# Patient Record
Sex: Female | Born: 1990 | Race: White | Hispanic: No | Marital: Single | Smoking: Former smoker
Health system: Southern US, Community
[De-identification: ages and names within clinical notes are randomized; demographics above are authoritative.]

## PROBLEM LIST (undated history)

## (undated) DIAGNOSIS — J45909 Unspecified asthma, uncomplicated: Secondary | ICD-10-CM

## (undated) DIAGNOSIS — G43909 Migraine, unspecified, not intractable, without status migrainosus: Secondary | ICD-10-CM

## (undated) DIAGNOSIS — K589 Irritable bowel syndrome without diarrhea: Secondary | ICD-10-CM

## (undated) DIAGNOSIS — F419 Anxiety disorder, unspecified: Secondary | ICD-10-CM

## (undated) DIAGNOSIS — Z8619 Personal history of other infectious and parasitic diseases: Secondary | ICD-10-CM

## (undated) DIAGNOSIS — Z87440 Personal history of urinary (tract) infections: Secondary | ICD-10-CM

## (undated) DIAGNOSIS — D649 Anemia, unspecified: Secondary | ICD-10-CM

## (undated) DIAGNOSIS — Z8041 Family history of malignant neoplasm of ovary: Secondary | ICD-10-CM

## (undated) HISTORY — DX: Personal history of other infectious and parasitic diseases: Z86.19

## (undated) HISTORY — DX: Migraine, unspecified, not intractable, without status migrainosus: G43.909

## (undated) HISTORY — DX: Anemia, unspecified: D64.9

## (undated) HISTORY — PX: NO PAST SURGERIES: SHX2092

## (undated) HISTORY — DX: Family history of malignant neoplasm of ovary: Z80.41

## (undated) HISTORY — PX: COLONOSCOPY: SHX174

## (undated) HISTORY — DX: Personal history of urinary (tract) infections: Z87.440

## (undated) HISTORY — PX: OTHER SURGICAL HISTORY: SHX169

## (undated) SURGICAL SUPPLY — 3 items
NDL HYPO 21X1.5 SAFETY (NEEDLE) IMPLANT
SOLN 0.9% NACL 1000 ML (IV SOLUTION) ×1 IMPLANT
SOLN STERILE WATER 1000 ML (IV SOLUTION) ×1 IMPLANT

## (undated) SURGICAL SUPPLY — 2 items
SOLN 0.9% NACL 1000 ML (IV SOLUTION) ×3 IMPLANT
SOLN STERILE WATER 1000 ML (IV SOLUTION) ×3 IMPLANT

---
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
## 2012-02-09 ENCOUNTER — Emergency Department: Payer: Self-pay | Admitting: Emergency Medicine

## 2012-05-05 ENCOUNTER — Observation Stay: Payer: Self-pay | Admitting: Internal Medicine

## 2012-05-05 LAB — COMPREHENSIVE METABOLIC PANEL
Alkaline Phosphatase: 114 U/L (ref 82–169)
Bilirubin,Total: 0.6 mg/dL (ref 0.2–1.0)
Calcium, Total: 9.1 mg/dL (ref 9.0–10.7)
Creatinine: 0.75 mg/dL (ref 0.60–1.30)
EGFR (African American): >60
Osmolality: 280 (ref 275–301)
Potassium: 3.3 mmol/L — ABNORMAL LOW (ref 3.5–5.1)
SGOT(AST): 18 U/L (ref 0–26)
SGPT (ALT): 15 U/L (ref 12–78)
Sodium: 139 mmol/L (ref 136–145)

## 2012-05-05 LAB — CBC
MCHC: 32.3 g/dL (ref 32.0–36.0)
MCV: 75 fL — ABNORMAL LOW (ref 80–100)
Platelet: 234 10*3/uL (ref 150–440)
WBC: 9.6 10*3/uL (ref 3.6–11.0)

## 2012-05-05 LAB — URINALYSIS, COMPLETE
Bilirubin,UR: NEGATIVE
Glucose,UR: NEGATIVE mg/dL (ref 0–75)
Nitrite: NEGATIVE
Protein: 30
RBC,UR: 1 /HPF (ref 0–5)
Specific Gravity: 1.023 (ref 1.003–1.030)
Squamous Epithelial: 3

## 2012-05-05 LAB — MAGNESIUM: Magnesium: 1.2 mg/dL — ABNORMAL LOW

## 2012-05-06 LAB — BASIC METABOLIC PANEL
BUN: 5 mg/dL — ABNORMAL LOW (ref 7–18)
Calcium, Total: 8 mg/dL — ABNORMAL LOW (ref 9.0–10.7)
Chloride: 110 mmol/L — ABNORMAL HIGH (ref 98–107)
Co2: 22 mmol/L (ref 21–32)
EGFR (African American): >60
EGFR (Non-African Amer.): >60
Glucose: 87 mg/dL (ref 65–99)
Osmolality: 274 (ref 275–301)
Potassium: 3.7 mmol/L (ref 3.5–5.1)
Sodium: 139 mmol/L (ref 136–145)

## 2012-05-06 LAB — MAGNESIUM: Magnesium: 2 mg/dL

## 2012-05-07 ENCOUNTER — Emergency Department: Payer: Self-pay

## 2012-05-07 LAB — BASIC METABOLIC PANEL WITH GFR
Anion Gap: 10 (ref 7–16)
BUN: 8 mg/dL (ref 7–18)
Calcium, Total: 8.7 mg/dL — ABNORMAL LOW (ref 9.0–10.7)
Chloride: 109 mmol/L — ABNORMAL HIGH (ref 98–107)
Co2: 19 mmol/L — ABNORMAL LOW (ref 21–32)
Creatinine: 0.71 mg/dL (ref 0.60–1.30)
EGFR (African American): >60
EGFR (Non-African Amer.): >60
Glucose: 101 mg/dL — ABNORMAL HIGH (ref 65–99)
Osmolality: 274 (ref 275–301)
Potassium: 3.5 mmol/L (ref 3.5–5.1)
Sodium: 138 mmol/L (ref 136–145)

## 2012-05-07 LAB — URINALYSIS, COMPLETE
Bilirubin,UR: NEGATIVE
Blood: NEGATIVE
Glucose,UR: NEGATIVE mg/dL (ref 0–75)
Nitrite: NEGATIVE
Ph: 7 (ref 4.5–8.0)
Protein: NEGATIVE
RBC,UR: 1 /HPF (ref 0–5)
Specific Gravity: 1.015 (ref 1.003–1.030)
Squamous Epithelial: 3
WBC UR: 2 /HPF (ref 0–5)

## 2012-05-07 LAB — CBC
HCT: 33.1 % — ABNORMAL LOW (ref 35.0–47.0)
HGB: 10.5 g/dL — ABNORMAL LOW (ref 12.0–16.0)
MCHC: 31.7 g/dL — ABNORMAL LOW (ref 32.0–36.0)
MCV: 75 fL — ABNORMAL LOW (ref 80–100)
RBC: 4.41 10*6/uL (ref 3.80–5.20)
WBC: 6.9 10*3/uL (ref 3.6–11.0)

## 2012-05-07 LAB — DRUG SCREEN, URINE
Amphetamines, Ur Screen: NEGATIVE (ref ?–1000)
Barbiturates, Ur Screen: NEGATIVE (ref ?–200)
Benzodiazepine, Ur Scrn: NEGATIVE (ref ?–200)
Cannabinoid 50 Ng, Ur ~~LOC~~: POSITIVE (ref ?–50)
Cocaine Metabolite,Ur ~~LOC~~: NEGATIVE (ref ?–300)
MDMA (Ecstasy)Ur Screen: NEGATIVE (ref ?–500)
Methadone, Ur Screen: NEGATIVE (ref ?–300)
Opiate, Ur Screen: NEGATIVE (ref ?–300)
Phencyclidine (PCP) Ur S: NEGATIVE (ref ?–25)
Tricyclic, Ur Screen: NEGATIVE (ref ?–1000)

## 2012-05-07 LAB — ETHANOL
Ethanol %: <0.003 % (ref 0.000–0.080)
Ethanol: <3 mg/dL

## 2012-05-07 LAB — MAGNESIUM: Magnesium: 1.2 mg/dL — ABNORMAL LOW

## 2012-05-08 LAB — CBC WITH DIFFERENTIAL/PLATELET
Basophil %: 0.1 %
Eosinophil #: 0 10*3/uL (ref 0.0–0.7)
HCT: 37.5 % (ref 35.0–47.0)
HGB: 12.3 g/dL (ref 12.0–16.0)
Lymphocyte #: 1 10*3/uL (ref 1.0–3.6)
Lymphocyte %: 11.4 %
MCH: 24.3 pg — ABNORMAL LOW (ref 26.0–34.0)
MCHC: 32.8 g/dL (ref 32.0–36.0)
Monocyte #: 0.7 x10 3/mm (ref 0.2–0.9)
Monocyte %: 8.1 %
Neutrophil #: 7.2 10*3/uL — ABNORMAL HIGH (ref 1.4–6.5)
Neutrophil %: 80.4 %
Platelet: 250 10*3/uL (ref 150–440)
WBC: 8.9 10*3/uL (ref 3.6–11.0)

## 2012-05-08 LAB — URINALYSIS, COMPLETE
Blood: NEGATIVE
Granular Cast: 2
Nitrite: NEGATIVE
Ph: 6 (ref 4.5–8.0)
RBC,UR: 8 /HPF (ref 0–5)
Specific Gravity: 1.025 (ref 1.003–1.030)
Squamous Epithelial: 4
WBC UR: 17 /HPF (ref 0–5)

## 2012-05-08 LAB — BASIC METABOLIC PANEL
BUN: 10 mg/dL (ref 7–18)
Creatinine: 0.63 mg/dL (ref 0.60–1.30)
EGFR (African American): >60
EGFR (Non-African Amer.): >60
Osmolality: 274 (ref 275–301)

## 2012-05-08 LAB — HEPATIC FUNCTION PANEL A (ARMC)
Albumin: 4.6 g/dL (ref 3.8–5.6)
Bilirubin, Direct: 0.1 mg/dL (ref 0.00–0.20)
Bilirubin,Total: 0.5 mg/dL (ref 0.2–1.0)
SGOT(AST): 19 U/L (ref 0–26)

## 2012-05-08 LAB — LIPASE, BLOOD: Lipase: 123 U/L (ref 73–393)

## 2012-05-08 LAB — MAGNESIUM: Magnesium: 1.5 mg/dL — ABNORMAL LOW

## 2012-05-09 ENCOUNTER — Observation Stay: Payer: Self-pay | Admitting: Internal Medicine

## 2012-05-09 LAB — BASIC METABOLIC PANEL
BUN: 11 mg/dL (ref 7–18)
Calcium, Total: 8.5 mg/dL — ABNORMAL LOW (ref 9.0–10.7)
Creatinine: 0.57 mg/dL — ABNORMAL LOW (ref 0.60–1.30)
EGFR (African American): >60
EGFR (Non-African Amer.): >60
Potassium: 3.4 mmol/L — ABNORMAL LOW (ref 3.5–5.1)

## 2012-05-09 LAB — MAGNESIUM: Magnesium: 2.2 mg/dL

## 2012-05-09 LAB — CBC WITH DIFFERENTIAL/PLATELET
Basophil #: 0 10*3/uL (ref 0.0–0.1)
Basophil %: 0.2 %
Eosinophil #: 0 10*3/uL (ref 0.0–0.7)
Eosinophil %: 0.2 %
Lymphocyte #: 1.3 10*3/uL (ref 1.0–3.6)
Lymphocyte %: 13.7 %
MCH: 24.2 pg — ABNORMAL LOW (ref 26.0–34.0)
MCHC: 32.8 g/dL (ref 32.0–36.0)
MCV: 74 fL — ABNORMAL LOW (ref 80–100)
Monocyte #: 1.2 x10 3/mm — ABNORMAL HIGH (ref 0.2–0.9)
Neutrophil %: 73.6 %
RBC: 4.32 10*6/uL (ref 3.80–5.20)
RDW: 18.8 % — ABNORMAL HIGH (ref 11.5–14.5)
WBC: 9.6 10*3/uL (ref 3.6–11.0)

## 2012-05-10 LAB — URINALYSIS, COMPLETE
Bilirubin,UR: NEGATIVE
Glucose,UR: NEGATIVE mg/dL (ref 0–75)
Nitrite: NEGATIVE
Ph: 8 (ref 4.5–8.0)
RBC,UR: 6 /HPF (ref 0–5)
Squamous Epithelial: 9
WBC UR: 20 /HPF (ref 0–5)

## 2012-05-10 LAB — BASIC METABOLIC PANEL
Anion Gap: 6 — ABNORMAL LOW (ref 7–16)
BUN: 4 mg/dL — ABNORMAL LOW (ref 7–18)
Calcium, Total: 9.1 mg/dL (ref 9.0–10.7)
Chloride: 106 mmol/L (ref 98–107)
Co2: 25 mmol/L (ref 21–32)
EGFR (African American): >60
Glucose: 103 mg/dL — ABNORMAL HIGH (ref 65–99)
Potassium: 3.5 mmol/L (ref 3.5–5.1)

## 2012-05-10 LAB — URINE CULTURE

## 2012-05-11 LAB — BASIC METABOLIC PANEL
Anion Gap: 8 (ref 7–16)
BUN: 3 mg/dL — ABNORMAL LOW (ref 7–18)
Chloride: 107 mmol/L (ref 98–107)
EGFR (African American): >60

## 2012-05-12 LAB — BASIC METABOLIC PANEL
BUN: 4 mg/dL — ABNORMAL LOW (ref 7–18)
Calcium, Total: 9 mg/dL (ref 9.0–10.7)
Chloride: 106 mmol/L (ref 98–107)
Creatinine: 0.58 mg/dL — ABNORMAL LOW (ref 0.60–1.30)
EGFR (Non-African Amer.): >60
Glucose: 97 mg/dL (ref 65–99)
Osmolality: 274 (ref 275–301)
Potassium: 3.6 mmol/L (ref 3.5–5.1)
Sodium: 139 mmol/L (ref 136–145)

## 2012-05-17 ENCOUNTER — Emergency Department: Payer: Self-pay | Admitting: Internal Medicine

## 2012-05-17 LAB — PREGNANCY, URINE: Pregnancy Test, Urine: NEGATIVE m[IU]/mL

## 2012-05-17 LAB — URINALYSIS, COMPLETE
Bilirubin,UR: NEGATIVE
Blood: NEGATIVE
Glucose,UR: NEGATIVE mg/dL (ref 0–75)
Nitrite: NEGATIVE
RBC,UR: 2 /HPF (ref 0–5)
Specific Gravity: 1.019 (ref 1.003–1.030)
Squamous Epithelial: 1

## 2012-05-17 LAB — CBC
HCT: 37.1 % (ref 35.0–47.0)
MCH: 23.7 pg — ABNORMAL LOW (ref 26.0–34.0)
MCV: 75 fL — ABNORMAL LOW (ref 80–100)
Platelet: 303 10*3/uL (ref 150–440)
RBC: 4.96 10*6/uL (ref 3.80–5.20)

## 2012-05-17 LAB — COMPREHENSIVE METABOLIC PANEL
Chloride: 102 mmol/L (ref 98–107)
Co2: 27 mmol/L (ref 21–32)
Creatinine: 0.68 mg/dL (ref 0.60–1.30)
EGFR (African American): >60
Osmolality: 272 (ref 275–301)
Potassium: 3.5 mmol/L (ref 3.5–5.1)
SGOT(AST): 18 U/L (ref 0–26)
SGPT (ALT): 45 U/L (ref 12–78)
Total Protein: 8.9 g/dL — ABNORMAL HIGH (ref 6.4–8.6)

## 2012-05-20 ENCOUNTER — Emergency Department: Payer: Self-pay | Admitting: Emergency Medicine

## 2012-05-20 LAB — CBC
HGB: 11.2 g/dL — ABNORMAL LOW (ref 12.0–16.0)
MCH: 23 pg — ABNORMAL LOW (ref 26.0–34.0)
MCHC: 31.2 g/dL — ABNORMAL LOW (ref 32.0–36.0)
MCV: 74 fL — ABNORMAL LOW (ref 80–100)
Platelet: 255 10*3/uL (ref 150–440)
RBC: 4.86 10*6/uL (ref 3.80–5.20)
RDW: 18.2 % — ABNORMAL HIGH (ref 11.5–14.5)
WBC: 12 10*3/uL — ABNORMAL HIGH (ref 3.6–11.0)

## 2012-05-20 LAB — DRUG SCREEN, URINE
Amphetamines, Ur Screen: NEGATIVE (ref ?–1000)
Benzodiazepine, Ur Scrn: NEGATIVE (ref ?–200)
Cannabinoid 50 Ng, Ur ~~LOC~~: POSITIVE (ref ?–50)
Cocaine Metabolite,Ur ~~LOC~~: NEGATIVE (ref ?–300)

## 2012-05-20 LAB — COMPREHENSIVE METABOLIC PANEL
Albumin: 4.2 g/dL (ref 3.8–5.6)
BUN: 11 mg/dL (ref 7–18)
Bilirubin,Total: 0.3 mg/dL (ref 0.2–1.0)
Calcium, Total: 9.4 mg/dL (ref 9.0–10.7)
Co2: 22 mmol/L (ref 21–32)
Creatinine: 0.83 mg/dL (ref 0.60–1.30)
EGFR (African American): >60
EGFR (Non-African Amer.): >60
Glucose: 110 mg/dL — ABNORMAL HIGH (ref 65–99)
Sodium: 136 mmol/L (ref 136–145)

## 2012-05-20 LAB — URINALYSIS, COMPLETE
Bacteria: NONE SEEN
Bilirubin,UR: NEGATIVE
Bilirubin,UR: NEGATIVE
Blood: NEGATIVE
Glucose,UR: NEGATIVE mg/dL (ref 0–75)
Glucose,UR: NEGATIVE mg/dL (ref 0–75)
Nitrite: NEGATIVE
Nitrite: NEGATIVE
Ph: 7 (ref 4.5–8.0)
RBC,UR: 10 /HPF (ref 0–5)
Specific Gravity: 1.019 (ref 1.003–1.030)
Squamous Epithelial: 3

## 2012-05-20 LAB — LIPASE, BLOOD: Lipase: 119 U/L (ref 73–393)

## 2012-08-01 ENCOUNTER — Emergency Department: Payer: Self-pay | Admitting: Emergency Medicine

## 2012-08-01 LAB — COMPREHENSIVE METABOLIC PANEL
Alkaline Phosphatase: 111 U/L (ref 82–169)
BUN: 15 mg/dL (ref 7–18)
Bilirubin,Total: 0.3 mg/dL (ref 0.2–1.0)
Co2: 27 mmol/L (ref 21–32)
Creatinine: 0.63 mg/dL (ref 0.60–1.30)
EGFR (African American): >60
EGFR (Non-African Amer.): >60
Potassium: 3.3 mmol/L — ABNORMAL LOW (ref 3.5–5.1)
SGOT(AST): 19 U/L (ref 0–26)
Sodium: 136 mmol/L (ref 136–145)
Total Protein: 8.2 g/dL (ref 6.4–8.6)

## 2012-08-01 LAB — CBC
HCT: 30.6 % — ABNORMAL LOW (ref 35.0–47.0)
MCH: 23.4 pg — ABNORMAL LOW (ref 26.0–34.0)
MCHC: 32.1 g/dL (ref 32.0–36.0)

## 2012-08-02 LAB — URINALYSIS, COMPLETE
Bilirubin,UR: NEGATIVE
Leukocyte Esterase: NEGATIVE
Ph: 5 (ref 4.5–8.0)
Specific Gravity: 1.027 (ref 1.003–1.030)
Squamous Epithelial: 1
WBC UR: 2 /HPF (ref 0–5)

## 2012-08-02 LAB — PREGNANCY, URINE: Pregnancy Test, Urine: NEGATIVE m[IU]/mL

## 2012-12-06 ENCOUNTER — Emergency Department: Payer: Self-pay | Admitting: Emergency Medicine

## 2012-12-25 ENCOUNTER — Emergency Department: Payer: Self-pay | Admitting: Emergency Medicine

## 2012-12-25 LAB — COMPREHENSIVE METABOLIC PANEL
Albumin: 3.8 g/dL (ref 3.4–5.0)
BUN: 11 mg/dL (ref 7–18)
Calcium, Total: 8.7 mg/dL (ref 8.5–10.1)
Chloride: 107 mmol/L (ref 98–107)
Co2: 26 mmol/L (ref 21–32)
Creatinine: 0.76 mg/dL (ref 0.60–1.30)
EGFR (African American): >60
EGFR (Non-African Amer.): >60
Glucose: 106 mg/dL — ABNORMAL HIGH (ref 65–99)
SGPT (ALT): 16 U/L (ref 12–78)
Sodium: 139 mmol/L (ref 136–145)
Total Protein: 7.9 g/dL (ref 6.4–8.2)

## 2012-12-25 LAB — URINALYSIS, COMPLETE
Bilirubin,UR: NEGATIVE
Blood: NEGATIVE
Ph: 5 (ref 4.5–8.0)
Protein: 30
Specific Gravity: 1.025 (ref 1.003–1.030)

## 2012-12-25 LAB — CBC WITH DIFFERENTIAL/PLATELET
Basophil #: 0.1 10*3/uL (ref 0.0–0.1)
Basophil %: 1 %
Eosinophil #: 0.1 10*3/uL (ref 0.0–0.7)
HCT: 32.3 % — ABNORMAL LOW (ref 35.0–47.0)
HGB: 10.7 g/dL — ABNORMAL LOW (ref 12.0–16.0)
Lymphocyte %: 22 %
MCHC: 33 g/dL (ref 32.0–36.0)
Monocyte #: 0.5 x10 3/mm (ref 0.2–0.9)
Monocyte %: 8.8 %
Neutrophil %: 66.8 %
RBC: 4.52 10*6/uL (ref 3.80–5.20)
RDW: 18.8 % — ABNORMAL HIGH (ref 11.5–14.5)

## 2013-02-01 ENCOUNTER — Emergency Department: Payer: Self-pay | Admitting: Emergency Medicine

## 2013-02-05 ENCOUNTER — Emergency Department: Payer: Self-pay | Admitting: Emergency Medicine

## 2013-02-05 LAB — COMPREHENSIVE METABOLIC PANEL
Alkaline Phosphatase: 101 U/L (ref 50–136)
Anion Gap: 3 — ABNORMAL LOW (ref 7–16)
BUN: 15 mg/dL (ref 7–18)
Bilirubin,Total: 0.5 mg/dL (ref 0.2–1.0)
Co2: 27 mmol/L (ref 21–32)
EGFR (African American): >60
Glucose: 91 mg/dL (ref 65–99)
SGOT(AST): 22 U/L (ref 15–37)
Sodium: 138 mmol/L (ref 136–145)
Total Protein: 8 g/dL (ref 6.4–8.2)

## 2013-02-05 LAB — CBC
HCT: 34.6 % — ABNORMAL LOW (ref 35.0–47.0)
HGB: 11.4 g/dL — ABNORMAL LOW (ref 12.0–16.0)
MCH: 23.4 pg — ABNORMAL LOW (ref 26.0–34.0)
MCV: 71 fL — ABNORMAL LOW (ref 80–100)
Platelet: 178 10*3/uL (ref 150–440)
RBC: 4.87 10*6/uL (ref 3.80–5.20)
RDW: 18.5 % — ABNORMAL HIGH (ref 11.5–14.5)

## 2013-02-05 LAB — URINALYSIS, COMPLETE
Bilirubin,UR: NEGATIVE
Glucose,UR: NEGATIVE mg/dL (ref 0–75)
Leukocyte Esterase: NEGATIVE
Protein: 30
Specific Gravity: 1.03 (ref 1.003–1.030)
WBC UR: <1 /HPF (ref 0–5)

## 2013-02-05 LAB — GC/CHLAMYDIA PROBE AMP

## 2013-02-05 LAB — HCG, QUANTITATIVE, PREGNANCY: Beta Hcg, Quant.: <1 m[IU]/mL — ABNORMAL LOW

## 2013-03-11 ENCOUNTER — Emergency Department: Payer: Self-pay | Admitting: Emergency Medicine

## 2013-03-11 LAB — COMPREHENSIVE METABOLIC PANEL
Albumin: 3.9 g/dL (ref 3.4–5.0)
Alkaline Phosphatase: 84 U/L
Anion Gap: 9 (ref 7–16)
BUN: 11 mg/dL (ref 7–18)
Bilirubin,Total: 0.4 mg/dL (ref 0.2–1.0)
Chloride: 110 mmol/L — ABNORMAL HIGH (ref 98–107)
Co2: 22 mmol/L (ref 21–32)
Creatinine: 0.61 mg/dL (ref 0.60–1.30)
EGFR (African American): >60
Osmolality: 281 (ref 275–301)
Potassium: 3.5 mmol/L (ref 3.5–5.1)
SGOT(AST): 26 U/L (ref 15–37)
SGPT (ALT): 20 U/L (ref 12–78)
Total Protein: 8.1 g/dL (ref 6.4–8.2)

## 2013-03-11 LAB — CBC
HCT: 32.8 % — ABNORMAL LOW (ref 35.0–47.0)
HGB: 10.5 g/dL — ABNORMAL LOW (ref 12.0–16.0)
MCHC: 31.8 g/dL — ABNORMAL LOW (ref 32.0–36.0)
MCV: 74 fL — ABNORMAL LOW (ref 80–100)
RBC: 4.46 10*6/uL (ref 3.80–5.20)
WBC: 7.8 10*3/uL (ref 3.6–11.0)

## 2013-03-11 LAB — RAPID INFLUENZA A&B ANTIGENS

## 2013-03-11 LAB — LIPASE, BLOOD: Lipase: 59 U/L — ABNORMAL LOW (ref 73–393)

## 2013-05-25 ENCOUNTER — Emergency Department: Payer: Self-pay | Admitting: Emergency Medicine

## 2013-05-25 LAB — HCG, QUANTITATIVE, PREGNANCY: Beta Hcg, Quant.: 6 m[IU]/mL — ABNORMAL HIGH

## 2013-05-29 ENCOUNTER — Emergency Department: Payer: Self-pay | Admitting: Emergency Medicine

## 2013-06-06 ENCOUNTER — Emergency Department: Payer: Self-pay | Admitting: Emergency Medicine

## 2013-06-06 LAB — BASIC METABOLIC PANEL
ANION GAP: 7 (ref 7–16)
BUN: 6 mg/dL — ABNORMAL LOW (ref 7–18)
CALCIUM: 9.2 mg/dL (ref 8.5–10.1)
CREATININE: 0.86 mg/dL (ref 0.60–1.30)
Chloride: 103 mmol/L (ref 98–107)
Co2: 29 mmol/L (ref 21–32)
EGFR (African American): >60
EGFR (Non-African Amer.): >60
GLUCOSE: 67 mg/dL (ref 65–99)
Osmolality: 273 (ref 275–301)
POTASSIUM: 3.3 mmol/L — AB (ref 3.5–5.1)
Sodium: 139 mmol/L (ref 136–145)

## 2013-06-06 LAB — URINALYSIS, COMPLETE
BILIRUBIN, UR: NEGATIVE
BLOOD: NEGATIVE
Bacteria: NONE SEEN
Glucose,UR: NEGATIVE mg/dL (ref 0–75)
Hyaline Cast: 2
KETONE: NEGATIVE
Nitrite: NEGATIVE
PROTEIN: NEGATIVE
Ph: 6 (ref 4.5–8.0)
Specific Gravity: 1.014 (ref 1.003–1.030)
Squamous Epithelial: 6
WBC UR: 3 /HPF (ref 0–5)

## 2013-06-06 LAB — CBC WITH DIFFERENTIAL/PLATELET
BASOS ABS: 0.1 10*3/uL (ref 0.0–0.1)
BASOS PCT: 0.7 %
EOS PCT: 0.7 %
Eosinophil #: 0.1 10*3/uL (ref 0.0–0.7)
HCT: 35.8 % (ref 35.0–47.0)
HGB: 11.6 g/dL — AB (ref 12.0–16.0)
LYMPHS ABS: 2.2 10*3/uL (ref 1.0–3.6)
Lymphocyte %: 28.2 %
MCH: 25.6 pg — ABNORMAL LOW (ref 26.0–34.0)
MCHC: 32.4 g/dL (ref 32.0–36.0)
MCV: 79 fL — AB (ref 80–100)
MONO ABS: 0.7 x10 3/mm (ref 0.2–0.9)
MONOS PCT: 8.7 %
Neutrophil #: 4.8 10*3/uL (ref 1.4–6.5)
Neutrophil %: 61.7 %
PLATELETS: 281 10*3/uL (ref 150–440)
RBC: 4.52 10*6/uL (ref 3.80–5.20)
RDW: 18.4 % — ABNORMAL HIGH (ref 11.5–14.5)
WBC: 7.8 10*3/uL (ref 3.6–11.0)

## 2013-06-06 LAB — PREGNANCY, URINE: Pregnancy Test, Urine: NEGATIVE m[IU]/mL

## 2013-06-08 LAB — URINE CULTURE

## 2013-06-12 ENCOUNTER — Emergency Department: Payer: Self-pay | Admitting: Emergency Medicine

## 2013-07-08 ENCOUNTER — Emergency Department: Payer: Self-pay | Admitting: Emergency Medicine

## 2013-08-17 ENCOUNTER — Emergency Department: Payer: Self-pay | Admitting: Emergency Medicine

## 2013-08-17 LAB — CBC WITH DIFFERENTIAL/PLATELET
BASOS PCT: 0.6 %
Basophil #: 0 10*3/uL (ref 0.0–0.1)
Eosinophil #: 0 10*3/uL (ref 0.0–0.7)
Eosinophil %: 0.3 %
HCT: 36.3 % (ref 35.0–47.0)
HGB: 12.1 g/dL (ref 12.0–16.0)
LYMPHS ABS: 1 10*3/uL (ref 1.0–3.6)
LYMPHS PCT: 21.7 %
MCH: 27.5 pg (ref 26.0–34.0)
MCHC: 33.2 g/dL (ref 32.0–36.0)
MCV: 83 fL (ref 80–100)
MONO ABS: 0.3 x10 3/mm (ref 0.2–0.9)
Monocyte %: 6.2 %
Neutrophil #: 3.4 10*3/uL (ref 1.4–6.5)
Neutrophil %: 71.2 %
PLATELETS: 168 10*3/uL (ref 150–440)
RBC: 4.4 10*6/uL (ref 3.80–5.20)
RDW: 17 % — AB (ref 11.5–14.5)
WBC: 4.8 10*3/uL (ref 3.6–11.0)

## 2013-08-17 LAB — URINALYSIS, COMPLETE
Bacteria: NONE SEEN
Bilirubin,UR: NEGATIVE
Blood: NEGATIVE
Glucose,UR: 150 mg/dL (ref 0–75)
Nitrite: NEGATIVE
Ph: 5 (ref 4.5–8.0)
Protein: 30
RBC,UR: NONE SEEN /HPF (ref 0–5)
Specific Gravity: 1.032 (ref 1.003–1.030)
Squamous Epithelial: 16

## 2013-08-17 LAB — COMPREHENSIVE METABOLIC PANEL
ALBUMIN: 3.7 g/dL (ref 3.4–5.0)
AST: 21 U/L (ref 15–37)
Alkaline Phosphatase: 69 U/L
Anion Gap: 8 (ref 7–16)
BUN: 14 mg/dL (ref 7–18)
Bilirubin,Total: 1 mg/dL (ref 0.2–1.0)
CALCIUM: 8.8 mg/dL (ref 8.5–10.1)
CO2: 22 mmol/L (ref 21–32)
Chloride: 103 mmol/L (ref 98–107)
Creatinine: 0.66 mg/dL (ref 0.60–1.30)
EGFR (African American): >60
GLUCOSE: 176 mg/dL — AB (ref 65–99)
Osmolality: 271 (ref 275–301)
Potassium: 3.1 mmol/L — ABNORMAL LOW (ref 3.5–5.1)
SGPT (ALT): 10 U/L — ABNORMAL LOW (ref 12–78)
Sodium: 133 mmol/L — ABNORMAL LOW (ref 136–145)
TOTAL PROTEIN: 7.9 g/dL (ref 6.4–8.2)

## 2013-08-17 LAB — WET PREP, GENITAL

## 2013-08-17 LAB — LIPASE, BLOOD: LIPASE: 64 U/L — AB (ref 73–393)

## 2013-08-17 LAB — GC/CHLAMYDIA PROBE AMP

## 2013-08-17 LAB — HCG, QUANTITATIVE, PREGNANCY: Beta Hcg, Quant.: 165071 m[IU]/mL — ABNORMAL HIGH

## 2013-11-06 ENCOUNTER — Observation Stay: Payer: Self-pay

## 2013-11-07 LAB — CBC WITH DIFFERENTIAL/PLATELET
Basophil #: 0 10*3/uL (ref 0.0–0.1)
Basophil %: 0.3 %
Eosinophil #: 0.1 10*3/uL (ref 0.0–0.7)
Eosinophil %: 1.3 %
HCT: 33.5 % — AB (ref 35.0–47.0)
HGB: 11.3 g/dL — ABNORMAL LOW (ref 12.0–16.0)
LYMPHS ABS: 1.8 10*3/uL (ref 1.0–3.6)
LYMPHS PCT: 19.9 %
MCH: 29.1 pg (ref 26.0–34.0)
MCHC: 33.6 g/dL (ref 32.0–36.0)
MCV: 87 fL (ref 80–100)
MONO ABS: 0.7 x10 3/mm (ref 0.2–0.9)
Monocyte %: 7.5 %
NEUTROS ABS: 6.3 10*3/uL (ref 1.4–6.5)
Neutrophil %: 71 %
Platelet: 181 10*3/uL (ref 150–440)
RBC: 3.87 10*6/uL (ref 3.80–5.20)
RDW: 18.5 % — AB (ref 11.5–14.5)
WBC: 8.8 10*3/uL (ref 3.6–11.0)

## 2013-11-07 LAB — URINALYSIS, COMPLETE
BACTERIA: NONE SEEN
Bilirubin,UR: NEGATIVE
Blood: NEGATIVE
Glucose,UR: NEGATIVE mg/dL (ref 0–75)
Ketone: NEGATIVE
Nitrite: NEGATIVE
Ph: 6 (ref 4.5–8.0)
Protein: NEGATIVE
RBC,UR: NONE SEEN /HPF (ref 0–5)
SPECIFIC GRAVITY: 1.004 (ref 1.003–1.030)

## 2013-11-07 LAB — COMPREHENSIVE METABOLIC PANEL
ALBUMIN: 3.1 g/dL — AB (ref 3.4–5.0)
ALK PHOS: 86 U/L
ALT: 19 U/L
AST: 11 U/L — AB (ref 15–37)
Anion Gap: 6 — ABNORMAL LOW (ref 7–16)
BUN: 6 mg/dL — AB (ref 7–18)
Bilirubin,Total: 0.2 mg/dL (ref 0.2–1.0)
CALCIUM: 8.3 mg/dL — AB (ref 8.5–10.1)
CO2: 24 mmol/L (ref 21–32)
Chloride: 105 mmol/L (ref 98–107)
Creatinine: 0.47 mg/dL — ABNORMAL LOW (ref 0.60–1.30)
EGFR (Non-African Amer.): >60
Glucose: 83 mg/dL (ref 65–99)
OSMOLALITY: 267 (ref 275–301)
POTASSIUM: 3.4 mmol/L — AB (ref 3.5–5.1)
Sodium: 135 mmol/L — ABNORMAL LOW (ref 136–145)
Total Protein: 7.4 g/dL (ref 6.4–8.2)

## 2013-11-07 LAB — MAGNESIUM: Magnesium: 1.5 mg/dL — ABNORMAL LOW

## 2013-12-10 ENCOUNTER — Observation Stay: Payer: Self-pay | Admitting: Obstetrics and Gynecology

## 2013-12-10 LAB — COMPREHENSIVE METABOLIC PANEL
ALBUMIN: 2.7 g/dL — AB (ref 3.4–5.0)
ANION GAP: 7 (ref 7–16)
Alkaline Phosphatase: 97 U/L
BUN: 6 mg/dL — AB (ref 7–18)
Bilirubin,Total: 0.3 mg/dL (ref 0.2–1.0)
CALCIUM: 8.3 mg/dL — AB (ref 8.5–10.1)
CHLORIDE: 107 mmol/L (ref 98–107)
Co2: 23 mmol/L (ref 21–32)
Creatinine: 0.4 mg/dL — ABNORMAL LOW (ref 0.60–1.30)
EGFR (African American): >60
EGFR (Non-African Amer.): >60
GLUCOSE: 69 mg/dL (ref 65–99)
Osmolality: 270 (ref 275–301)
Potassium: 3.8 mmol/L (ref 3.5–5.1)
SGOT(AST): 20 U/L (ref 15–37)
SGPT (ALT): 16 U/L
SODIUM: 137 mmol/L (ref 136–145)
Total Protein: 7 g/dL (ref 6.4–8.2)

## 2013-12-10 LAB — DRUG SCREEN, URINE
Amphetamines, Ur Screen: NEGATIVE (ref ?–1000)
BENZODIAZEPINE, UR SCRN: NEGATIVE (ref ?–200)
Barbiturates, Ur Screen: NEGATIVE (ref ?–200)
COCAINE METABOLITE, UR ~~LOC~~: NEGATIVE (ref ?–300)
Cannabinoid 50 Ng, Ur ~~LOC~~: POSITIVE (ref ?–50)
MDMA (ECSTASY) UR SCREEN: NEGATIVE (ref ?–500)
Methadone, Ur Screen: NEGATIVE (ref ?–300)
OPIATE, UR SCREEN: NEGATIVE (ref ?–300)
Phencyclidine (PCP) Ur S: NEGATIVE (ref ?–25)
Tricyclic, Ur Screen: NEGATIVE (ref ?–1000)

## 2013-12-10 LAB — CBC
HCT: 33 % — ABNORMAL LOW (ref 35.0–47.0)
HGB: 10.8 g/dL — ABNORMAL LOW (ref 12.0–16.0)
MCH: 29 pg (ref 26.0–34.0)
MCHC: 32.8 g/dL (ref 32.0–36.0)
MCV: 89 fL (ref 80–100)
PLATELETS: 131 10*3/uL — AB (ref 150–440)
RBC: 3.73 10*6/uL — ABNORMAL LOW (ref 3.80–5.20)
RDW: 17.8 % — AB (ref 11.5–14.5)
WBC: 6.5 10*3/uL (ref 3.6–11.0)

## 2013-12-10 LAB — LIPASE, BLOOD: Lipase: 75 U/L (ref 73–393)

## 2013-12-10 LAB — URINALYSIS, COMPLETE
BLOOD: NEGATIVE
Bilirubin,UR: NEGATIVE
Glucose,UR: NEGATIVE mg/dL (ref 0–75)
Nitrite: NEGATIVE
Ph: 7 (ref 4.5–8.0)
Protein: NEGATIVE
RBC,UR: 5 /HPF (ref 0–5)
Specific Gravity: 1.017 (ref 1.003–1.030)
Squamous Epithelial: 34
WBC UR: 7 /HPF (ref 0–5)

## 2014-01-29 ENCOUNTER — Observation Stay: Payer: Self-pay | Admitting: Emergency Medicine

## 2014-02-25 ENCOUNTER — Observation Stay: Payer: Self-pay | Admitting: Obstetrics and Gynecology

## 2014-03-22 ENCOUNTER — Observation Stay: Payer: Self-pay | Admitting: Obstetrics and Gynecology

## 2014-03-24 ENCOUNTER — Inpatient Hospital Stay: Payer: Self-pay | Admitting: Obstetrics & Gynecology

## 2014-03-24 LAB — CBC WITH DIFFERENTIAL/PLATELET
BASOS ABS: 0 10*3/uL (ref 0.0–0.1)
BASOS PCT: 0.2 %
Eosinophil #: 0 10*3/uL (ref 0.0–0.7)
Eosinophil %: 0.1 %
HCT: 31.6 % — AB (ref 35.0–47.0)
HGB: 10.6 g/dL — AB (ref 12.0–16.0)
Lymphocyte #: 0.5 10*3/uL — ABNORMAL LOW (ref 1.0–3.6)
Lymphocyte %: 9.4 %
MCH: 28.2 pg (ref 26.0–34.0)
MCHC: 33.5 g/dL (ref 32.0–36.0)
MCV: 84 fL (ref 80–100)
MONO ABS: 0.5 x10 3/mm (ref 0.2–0.9)
MONOS PCT: 9.4 %
NEUTROS ABS: 4.2 10*3/uL (ref 1.4–6.5)
NEUTROS PCT: 80.9 %
PLATELETS: 102 10*3/uL — AB (ref 150–440)
RBC: 3.76 10*6/uL — ABNORMAL LOW (ref 3.80–5.20)
RDW: 15.7 % — AB (ref 11.5–14.5)
WBC: 5.2 10*3/uL (ref 3.6–11.0)

## 2014-03-25 LAB — PLATELET COUNT: Platelet: 96 10*3/uL — ABNORMAL LOW (ref 150–440)

## 2014-03-26 LAB — HEMATOCRIT: HCT: 32.2 % — AB (ref 35.0–47.0)

## 2014-03-27 LAB — URINALYSIS, COMPLETE
Bilirubin,UR: NEGATIVE
Glucose,UR: NEGATIVE mg/dL (ref 0–75)
Ketone: NEGATIVE
Nitrite: NEGATIVE
PH: 7 (ref 4.5–8.0)
PROTEIN: NEGATIVE
RBC,UR: 291 /HPF (ref 0–5)
SPECIFIC GRAVITY: 1.012 (ref 1.003–1.030)
Squamous Epithelial: 12

## 2014-03-29 LAB — URINE CULTURE

## 2014-07-15 NOTE — Consult Note (Signed)
Chief Complaint:  Subjective/Chief Complaint No diarrhea or vomiting. Remains nauseated.  Advance diet slowly. Agree with rest of present management. Colonoscopy probably as OP.   Electronic Signatures: Lurline DelIftikhar, Domani Bakos (MD)  (Signed [id number]-Feb-14 23:09)  Authored: Chief Complaint   Last Updated: 17-Feb-14 23:09 by Lurline DelIftikhar, Sharita Bienaime (MD)

## 2014-07-15 NOTE — Consult Note (Signed)
PATIENT NAME:  Kylie BattiestHATLEY, Aijalon F MR#:  [id number] DATE OF BIRTH:  03/20/1991  DATE OF CONSULTATION:  05/13/2012  REFERRING PHYSICIAN:   CONSULTING PHYSICIAN:  Lurline DelShaukat Amarilys Lyles, MD  REASON FOR CONSULTATION:  Nausea, vomiting, diarrhea.   HISTORY OF PRESENT ILLNESS: This is a 24 year old Caucasian female with history of irritable bowel syndrome according to her. Apparently the patient was initially admitted on February 11th and was discharged the next day with what appeared to be a viral gastroenteritis. The patient presented with nausea, vomiting and diarrhea at that time. The patient started to have the same symptoms after discharge again and was readmitted on 05/10/2012. According to the patient, she has not had problem with nausea, vomiting or diarrhea in the past; all started at the time of her initial admission and has not improved. She is also complaining of epigastric pain. Her diarrhea has resolved since admission into the hospital and she has not had any further bowel movements. In the hospital her vomiting has improved, although she continued to be significantly nauseated. She was hypokalemic and hypomagnesemic on admission. She denies any prior GI problems or GI investigation.   PAST MEDICAL HISTORY:  Significant for irritable bowel syndrome according to her.  History of irregular menstrual cycles on Implanon which was placed several months ago.   ALLERGIES:  THE PATIENT IS ALLERGIC TO PERCOCET.  SOCIAL HISTORY: She lives at home with her dad, smokes less than half pack a day. She did admit to using marijuana and according to her, she has quit it about 2 weeks ago, although apparently drug urine screen was positive for marijuana.   HOME MEDICATIONS: Phenergan p.r.n.   REVIEW OF SYSTEMS:  Grossly negative except for what is mentioned in the History of Present Illness.   PHYSICAL EXAMINATION: GENERAL: Well built female, does not appear to be in any acute distress.   VITAL SIGNS: Her  vitals were fairly stable, she has been afebrile. Heart rate mostly in the 70s and 80s, respirations 18 to 20, blood pressure around 120 to 130/70 to 80.  SKIN:  Unremarkable.  LUNGS: Clear to auscultation.  CARDIOVASCULAR: Regular rate and rhythm.  ABDOMEN: Fairly benign appearing abdomen. Bowel sounds positive and regular. Some tenderness was noted in the right lower quadrant area, as well as mild diffuse abdominal tenderness. There is no rebound or guarding. No hepatosplenomegaly or ascites was noted.  NEUROLOGIC: Appears to be grossly nonfocal.   LABORATORY AND DIAGNOSTIC DATA:  White cell count normal hemoglobin is 10.5, hematocrit 33, platelet count 188. Electrolytes initially showed hypomagnesemia and hypokalemia, although later on electrolytes became quite unremarkable. Liver enzymes were normal. Serum lipase was normal as well.   ASSESSMENT AND PLAN: The patient is with what appears to be a viral gastroenteritis that has lasted a little more than expected. She does have history of irritable bowel syndrome, but I doubt that this is an exacerbation of her irritable bowel syndrome, although it is certainly possible. The patient's diarrhea and vomiting have resolved. She continues to be somewhat nauseated at the time of evaluation. Abdominal examination is fairly benign. Some right lower quadrant tenderness was noted on examination. According to the patient, Crohn's disease runs in her family including I believe, her dad according to her.  According to her in the past she has seen his stools with blood. Recommendations were made to treat patient conservatively with intravenous hydration, control of nausea and vomiting. The patient was started on clear liquid diet which was gradually advanced and she  tolerated it fairly well. The patient has inquired about a colonoscopy and due to family history of Crohn's disease as well blood in stool and right lower quadrant tenderness, a colonoscopy at a later date  would probably be helpful to rule out inflammatory bowel disease. The patient will follow up with [place] as outpatient and further recommendations will be made.      ____________________________ Lurline Del, MD si:ct D: 05/13/2012 09:41:59 ET T: 05/13/2012 09:52:05 ET JOB#: [id number]  cc: Lurline Del, MD, <Dictator> Lurline Del MD ELECTRONICALLY SIGNED 05/21/2012 12:14

## 2014-07-15 NOTE — Discharge Summary (Signed)
PATIENT NAME:  Kylie [name], Kylie [name] MR#:  [id number] DATE OF BIRTH:  [birth year]  DATE OF ADMISSION:  05/09/2012  DATE OF DISCHARGE:  05/12/2012  PRIMARY CARE PHYSICIAN: [facility] [address], [place].   PRIMARY GASTROENTEROLOGIST:  Dr. Niel HummerIftikhar.   DISCHARGE DIAGNOSES:  Gastroenteritis, urinary tract infection, drug abuse.   HISTORY OF PRESENTING ILLNESS:  A 24 year old Caucasian female with past medical history of irritable bowel syndrome and recent history of nausea, vomiting and diarrhea, regarding which she was admitted on February 11 and was discharged on February 12 for viral gastroenteritis.  The patient went home and reported that she was still nauseous and has been vomiting, associated with diarrhea and abdominal pain for the past 3 to 4 days. The patient reported that she was never completely recovered from her symptoms. Had nausea, vomiting and diarrhea with epigastric abdominal pain of 5 to 6 out of 10. Pain was achy in nature. Denied any blood in the stool, but she had pinkish tinge in her vomitus.  Was feeling weak and tired but denied any passing out episode. In the ER, her potassium was 2.8, magnesium was 1.5. She did not have any fever or recent antibiotic use.   HOSPITAL COURSE AND STAY:  She was admitted with diagnosis of acute gastroenteritis, probably viral. Initially kept her n.p.o. with IV fluids to hydrate her properly and electrolyte replacement with symptomatic control of her nausea and vomiting with Phenergan and antacids.  GI consult was called in as patient had irritable bowel disease and family history of Crohn's disease. Dr. Niel HummerIftikhar saw the patient. He agreed with the current plan, and he ordered an IBD panel with advice to follow in the clinic for a colonoscopy in the future once she becomes symptomatically better from this current episode. Her symptoms resolved. She started tolerating diet, and so she was discharged  home.  OTHER MEDICAL ISSUES:  1.  Hypokalemia. We replaced and it became normal.  2. Tobacco abuse disorder. She was counseled for tobacco cessation, and nicotine patch was applied while she was in the hospital.  3.  Urine drug screen was positive for cannabinoids. Counseled the patient about that. She tolerated a regular diet very well and she was discharged home. Magnesium was 1.2 on admission, potassium was 2.8.   CONDITION ON DISCHARGE:  Stable.   CODE STATUS ON DISCHARGE:  Full code.   MEDICATIONS ADVISED ON DISCHARGE: phenargan 25 mg oral tablet every 8 hours for 5 days as needed for nausea and vomiting, Ceftin 500 mg oral tablet 2 times a day for 4 days, Protonix 40 mg delayed-release tablet once a day, Nicotine 7 mg/24-hour transdermal film once a day for 10 days.   HOME OXYGEN ON DISCHARGE: No.   DIET ON DISCHARGE: Regular. Diet consistency on discharge: Regular.   ACTIVITY LIMITATION: As tolerated.   TIMEFRAME TO FOLLOW UP:  Within 1 to 2 weeks in GI clinic with Dr. Lurline DelShaukat Iftikhar.   TOTAL TIME SPENT: 45 minutes.    ____________________________ Hope PigeonVaibhavkumar G. Elisabeth PigeonVachhani, MD vgv:dm D: 05/14/2012 21:47:38 ET T: 05/15/2012 09:28:47 ET JOB#: [id number]  cc: Hope PigeonVaibhavkumar G. Elisabeth PigeonVachhani, MD, <Dictator> [facility] Lurline DelShaukat Iftikhar, MD Heath GoldVAIBHAVKUMAR [facility] MD ELECTRONICALLY SIGNED 05/15/2012 19:57

## 2014-07-15 NOTE — H&P (Signed)
PATIENT NAME:  Kylie [name], Kylie [name] MR#:  [id number] DATE OF BIRTH:  January 15, 1991  DATE OF ADMISSION:  05/05/2012  PRIMARY CARE PHYSICIAN:  None.  CHIEF COMPLAINT: Nausea, vomiting and diarrhea for 3 days.   HISTORY OF PRESENT ILLNESS:  The patient is a 24 year old Caucasian female who has history of IBS, comes in with acute onset nausea, vomiting and diarrhea for past 3 days. The patient says she is unable to tolerate anything p.o. She did have a couple episodes of vomiting in the Emergency Room along with an episode of diarrhea. She received IV normal saline x 3 liters along with IV mag and IV potassium. She did get several rounds of Zofran and p.r.n. Phenergan. She is unable to keep anything p.o. and has continued to vomit and, hence, is being admitted for further evaluation and management. The patient denies any sick contacts. She denies any fever. She is having abdominal pain and dry heaving as well.   PAST MEDICAL HISTORY:   1.  IBS. 2.  History of irregular menstrual cycles secondary to the patient being on Implanon.     ALLERGIES: PERCOCET.   DISCHARGE MEDICATIONS:  Implanon 68 mg subcutaneous implant.  FAMILY HISTORY: Positive for hypertension.   SOCIAL HISTORY: She denies any history of smoking or drug use. She does not work, neither does she attend college at this time.   REVIEW OF SYSTEMS:  CONSTITUTIONAL: No fever. Positive for fatigue, weakness.  EYES: No blurred or double vision. No glaucoma.  ENT: No tinnitus, ear pain, hearing loss.  RESPIRATORY: No cough, wheeze, hemoptysis, COPD.  CARDIOVASCULAR: No chest pain, orthopnea, edema, palpitations.  GASTROINTESTINAL: Positive for nausea, vomiting, diarrhea and abdominal pain.  GENITOURINARY: No dysuria or hematuria or renal calculus. ENDOCRINE: No polyuria, nocturia or thyroid problems.  HEMATOLOGY: No anemia or easy bruising.  SKIN: No acne or rash.  MUSCULOSKELETAL: No arthritis or gout. NEUROLOGIC: No CVA or TIA. PSYCH: No  anxiety or depression. All other systems reviewed and negative.   PHYSICAL EXAMINATION: GENERAL: The patient is awake, alert, oriented x 3, mild to moderate distress due to nausea and vomiting.  VITAL SIGNS: Afebrile, pulse 68, blood pressure 103/74, sats are 100% on room air.  HEENT: Atraumatic, normocephalic. PERRLA, EOMI.  Oral mucosa is dry, poor dentition.  NECK: Supple. No JVD. No carotid bruit.  LUNGS: Clear to auscultation bilaterally. No rales, rhonchi, respiratory distress or labored breathing.  HEART: Both the heart sounds are normal. Rate, rhythm regular. PMI not lateralized.  CHEST: Nontender.  EXTREMITIES: Good pedal pulses, good femoral pulses. No lower extremity edema.  ABDOMEN: Soft. There is some mild diffuse tenderness. No guarding or rigidity. No organomegaly. Hyperactive bowel sounds.  NEUROLOGIC: Grossly intact cranial nerves II through XII. No motor or sensory deficits.  PSYCH: The patient is awake, alert, oriented x 3.  SKIN: Warm and dry.   STUDIES:   1.  CT of the abdomen unremarkable.  3.  UA negative for UTI.  3.  CBC within normal limits except MCV of 75.  4.  Lipase 67.  5.  Glucose 146, potassium 3.3, chloride 109, bicarb 20, magnesium is 1.2.   ASSESSMENT: A 24 year old patient with history of irritable bowel syndrome, comes in with:  1.  Acute gastroenteritis. The patient will be admitted under observation. She will be n.p.o. except ice chips. Continue IV fluids with aggressive electrolyte replacement. The patient received a pack of IV mag in the Emergency Room 4 grams. We will check labs in the morning  and replace accordingly. The patient will be given around-the-clock IV Zofran alternating with Phenergan. IV.  2.  Irritable bowel syndrome appears to be stable.  3.  Abdominal pain secondary to dry heaving, nausea and vomiting. We will give p.r.n. IV pain medications if needed.  4.  Deep vein thrombosis prophylaxis. The patient is ambulatory.  5.  Further  workup according to the patient's clinical course. Hospital admission plan was discussed with the patient who was agreeable to it.   TIME SPENT: 40 minutes. No family present in the Emergency Room.    ____________________________ Wylie Hail Allena Katz, MD sap:cs D: 05/05/2012 20:23:23 ET T: 05/05/2012 20:45:26 ET JOB#: [id number]  cc: Laquia Rosano A. Allena Katz, MD, <Dictator> Willow Ora MD ELECTRONICALLY SIGNED 05/05/2012 21:15

## 2014-07-15 NOTE — H&P (Signed)
PATIENT NAME:  Kylie [name], RAYBUCK MR#:  [id number] DATE OF BIRTH:  1990/08/03  DATE OF ADMISSION:  05/09/2012  PRIMARY CARE PHYSICIAN: Steele Sizer, MD  REFERRING PHYSICIAN: Maricela Bo, MD  CHIEF COMPLAINT: Nausea, vomiting, diarrhea and abdominal pain.   HISTORY OF PRESENT ILLNESS: The patient is a 24 year old Caucasian female with a past medical history of irritable bowel syndrome and a recent history of nausea, vomiting and diarrhea, regarding which she was admitted on February 11th and was discharged home on February 12th for viral gastroenteritis. The patient went home and reporting that today she is still nauseous and has been vomiting, associated with diarrhea and abdominal pain for the past 3 to 4 days. The patient is reporting that she was not completely recuperated from the previous illness and has been persistently having nausea, vomiting and diarrhea. She is having epigastric abdominal pain which is 5 to 6 out of 10. The pain is achy in nature. She denies any blood in her stool, but complaining of pinkish tinge in her vomit. The patient was feeling weak and tired, but denies any passing out. The patient came into the ER. Her potassium was found to be at 2.8 with a magnesium of 1.5. The patient has received 40 mEq of IV potassium and also 40 mEq p.o. potassium. She has received 2 grams of magnesium sulfate in the IV form. The patient was made n.p.o., and she was given 2 liters of IV fluid boluses. The patient was also given IV Phenergan regarding her intractable nausea and vomiting. She denies any fever, denies any recent use of antibiotics. No sick contacts.   PAST MEDICAL HISTORY: Irritable bowel syndrome, history of irregular menstrual cycles as the patient is on Implanon.  ALLERGIES: SHE IS ALLERGIC TO PERCOCET.  SOCIAL HISTORY: Lives at home with her dad. Smokes less than half-pack a day. Denies any alcohol or illicit drug usage.   FAMILY HISTORY: Her father has a history of  hypertension   HOME MEDICATIONS:  1. Phenergan 25 mg p.o. q.8 hours. 2. Implanon subcutaneously.   REVIEW OF SYSTEMS: CONSTITUTIONAL: No fever. Complaining of fatigue and weakness.  EYES: Denies blurry vision or cataracts.  ENT: Denies tinnitus, ear pain, snoring, postnasal drip.  RESPIRATORY: Denies cough, COPD, hemoptysis.  CARDIOVASCULAR: Denies any chest pain, palpitations.  GASTROINTESTINAL: Complaining of nausea, vomiting, diarrhea and epigastric abdominal pain.  GENITOURINARY: No dysuria or hematuria.  GYNECOLOGIC AND BREASTS: Denies any breast mass or vaginal discharge, but complaining of menorrhagia from Implanon.  ENDOCRINE: Denies polyuria, polyphagia or polydipsia. Denies any thyroid problems. HEMATOLOGIC AND LYMPHATIC: Denies any anemia or bleeding.  INTEGUMENTARY: Denies acne, rash, lesions.  MUSCULOSKELETAL: Denies any neck pain, back pain, shoulder pain. NEUROLOGIC: Denies any vertigo, ataxia, dementia.  PSYCHIATRIC: Denies any insomnia, ADD, OCD.   PHYSICAL EXAMINATION:  VITAL SIGNS: Temperature 97.7, pulse 80, respirations 20, blood pressure 118/67, eventually, it which went up to 140/78, pulse oximetry 99% on room air.  GENERAL APPEARANCE: Not in acute distress, thin built and moderately nourished, looking weak and tired.  HEENT: Normocephalic, atraumatic. Pupils are equally reactive to light and accommodation. No conjunctival injection. Nares are patent, no congestion. No postnasal drip.  NECK: Supple. No JVD. No thyromegaly.  LUNGS: Clear to auscultation bilaterally. No accessory muscle usage. No anterior chest wall tenderness.  CARDIAC: S1, S2 normal. Regular rate and rhythm. No murmurs.  GASTROINTESTINAL: Soft. Bowel sounds are positive in all 4 quadrants. Positive epigastric tenderness, but no rebound tenderness. No masses.  BACK:  No CVA tenderness.  NEUROLOGIC: Awake, alert and oriented x3. Motor and sensory are intact. Reflexes are 2+.  SKIN: Warm to touch.  Poor turgor. Dry mucous membranes.  EXTREMITIES: No edema. No cyanosis. No clubbing.  MUSCULOSKELETAL: No joint effusion or tenderness.  PSYCHIATRIC: Normal mood and affect.   LABS AND IMAGING STUDIES: Glucose 88, BUN 10, creatinine 0.63, sodium 138, potassium 2.8, chloride 105, CO2 20, GFR greater than 50, anion gap is 13, serum osmolality 274, calcium 9.7, magnesium 1.5, lipase 123, total protein is 9.7, albumin 4.6, total bilirubin 0.5, direct bilirubin 0.1, alkaline phosphatase 109, AST 19, ALT 15. Urine drug screen is positive for cannabinoids. WBC 8.9, hemoglobin 12.3, hematocrit is 37.5, platelet count is 250. Urinalysis: Blood is negative, protein 75 mg/dL, nitrite negative, leukocyte esterase 2+, amber-colored urine, glucose negative, bilirubin negative, granular casts 2.  ASSESSMENT AND PLAN:  1. Acute gastroenteritis, probably viral. Will keep her n.p.o. Will provide her IV fluids and will give her Protonix IV q.a.m. If the situation is not getting better, will strongly consider IV antibiotics. Will order stool tests. Will provide her pain treatment in IV form on as-needed basis.  2. Hypokalemia. Potassium chloride 40 mEq IV was given in the ER and potassium chloride 40 p.o. was also given. Will continue IV fluids with potassium chloride.  3. Hypomagnesemia. Magnesium sulfate 2 grams IV was given in the ER. Will check magnesium level in the a.m. and will replete on as-needed basis.  4. Urinary tract infection. Urine culture is ordered. Will provide her IV Rocephin. 5. Nicotine dependence. The patient was counseled to quit smoking and will provide her nicotine patch.  6. Urine drug screen is positive for cannabinoids. The patient was counseled to quit taking illicit drugs.  7. She is full code.  Diagnoses and plan of care were discussed in detail with the patient.  TIME SPENT ON ADMISSION: 45 minutes.   ____________________________ Ramonita LabAruna Shavonna Corella, MD ag:OSi D: 05/09/2012 03:22:45  ET T: 05/09/2012 12:53:25 ET JOB#: [id number]  cc: Ramonita LabAruna May Manrique, MD, <Dictator> Ramonita LabARUNA Eder Macek MD ELECTRONICALLY SIGNED 05/12/2012 6:25

## 2014-07-15 NOTE — Consult Note (Signed)
Brief Consult Note: Diagnosis: Nausea, vomiting and diarrhea.   Patient was seen by consultant.   Comments: Patient with recurrent nausea, vomiting and diarrhea in the last few days. ? Viral gastroentritis vs cyclic vomiting syndrome due to marijuana use although patient has never had these symptoms before. IBD is less likely although patient has family h/o Crohn's disease. Exacerbation of IBS is another consideration especially as CT and labs are unremarkable.  Flank pain. CT is unremarkable. She has bilateral flank tenderness right more than left. UA is unremarkable and the specimen appears contaminated.  Recommendations: Agree with current supportive care. Will obtain IBD panel and patient may benefit from a colonoscopy once overall better. Will follow.  Electronic Signatures: Lurline DelIftikhar, Nancylee Gaines (MD)  (Signed 16-Feb-14 15:56)  Authored: Brief Consult Note   Last Updated: 16-Feb-14 15:56 by Lurline DelIftikhar, Destina Mantei (MD)

## 2014-07-15 NOTE — Discharge Summary (Signed)
PATIENT NAME:  Kylie BattiestHATLEY, Lincoln F MR#:  [id number] DATE OF BIRTH:  Sep 27, 1990  DATE OF ADMISSION:  05/05/2012 DATE OF DISCHARGE:  05/06/2012  PRIMARY CARE PHYSICIAN: Dr. Dossie Arbourrissman.  FINAL DIAGNOSES: 1.  Viral gastroenteritis, nausea, vomiting, diarrhea and abdominal pain.  2.  Hypokalemia and hypomagnesemia.   MEDICATIONS ON DISCHARGE:  Include:  1.  Implanon 68 mg subcutaneous implant that the patient has already.  2.  Promethazine 25 mg every 4 to 6 hours as needed for nausea and vomiting, motion sickness.   FOLLOWUP: One to two weeks with Dr. Dossie Arbourrissman.   DIET: Regular.   ACTIVITY: As tolerated.   REASON FOR ADMISSION: The patient was admitted as an observation 05/05/2012 and discharged 05/06/2012. The patient came in with nausea, vomiting and diarrhea for 3 days.   HISTORY OF PRESENT ILLNESS: A 24 year old female, who had acute onset nausea, vomiting and diarrhea for the past 3 days, unable to tolerate anything orally. She had a couple of episodes of vomiting in the Emergency Room with an episode of diarrhea, received IV fluid, IV magnesium and potassium. Her father also had an episode of vomiting. She was admitted with acute gastroenteritis, admitted as observation, given IV fluid hydration, p.r.n. pain medication and IV nausea medication.   LABORATORY AND RADIOLOGICAL DATA DURING THE HOSPITAL COURSE: The patient had a magnesium of 1.2, beta-hCG 1, glucose 146, BUN 13, creatinine 0.75, sodium 139, potassium 3.3, chloride 109, CO2 of 20, calcium 9.1, liver function tests normal, lipase 67, white blood cell count 9.6, hemoglobin and hematocrit 11.4 and 35.2, platelet count of 234. Urinalysis: 2+ ketones. CT scan of the abdomen and pelvis with contrast showed unremarkable. Repeat magnesium 2.0. Repeat potassium 3.7.   HOSPITAL COURSE: For the patient's viral gastroenteritis, nausea, vomiting, diarrhea and abdominal pain, the patient did still have some abdominal soreness, but nausea and  vomiting had settled down, and there was no diarrhea during the hospital course. The patient was restarted on a diet and tolerated well and was discharged home in stable condition. For her hypokalemia and hypomagnesemia, this was replaced IV during the hospitalization and responded well to treatment. Once on a diet, these electrolytes should not be an issue. The patient was discharged home in stable condition.   TIME SPENT ON DISCHARGE: 35 minutes.    ____________________________ Herschell Dimesichard J. Renae GlossWieting, MD rjw:lg D: 05/07/2012 15:06:10 ET T: 05/07/2012 21:24:02 ET JOB#: [id number]  cc: Herschell Dimesichard J. Renae GlossWieting, MD, <Dictator> [facility] Salley ScarletICHARD J Valori Hollenkamp MD ELECTRONICALLY SIGNED 05/13/2012 14:41

## 2014-07-22 IMAGING — CT CT ABD-PELV W/ CM
1 of 2 series · 16 of 32 positions shown, 20 images · non-contrast
Comparison: none

REASON FOR EXAM: (1) diffuse abdominal pain vomiting, cannot tolerate PO
contrast; (2) diffuse ab
COMMENTS:

PROCEDURE:     CT  - CT ABDOMEN / PELVIS  W  - May 05, 2012  [DATE]
RESULT:     CT abdomen pelvis dated 05/05/2012.
TECHNIQUE: Helical 3 mm sections were obtained from lung bases through the
pubic symphysis status post intravenous administration of 85 mL of
[id number].

[Series 2: 3mm soft tissue · axial · 0.66mm/px · z∈[-486,-96]mm · 16 of 145 slices shown, 20 images]
[im 10/145  soft-tissue]
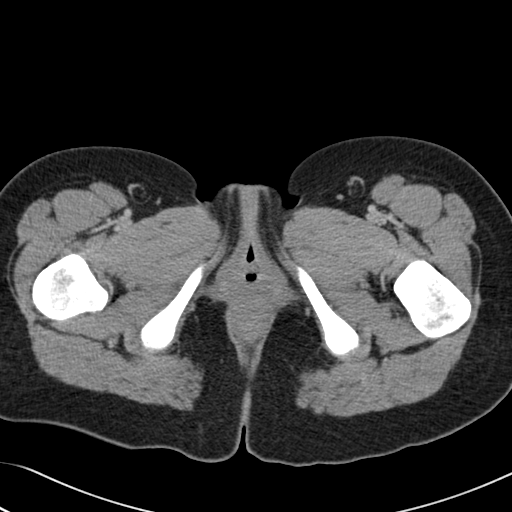
[im 10/145  bone]
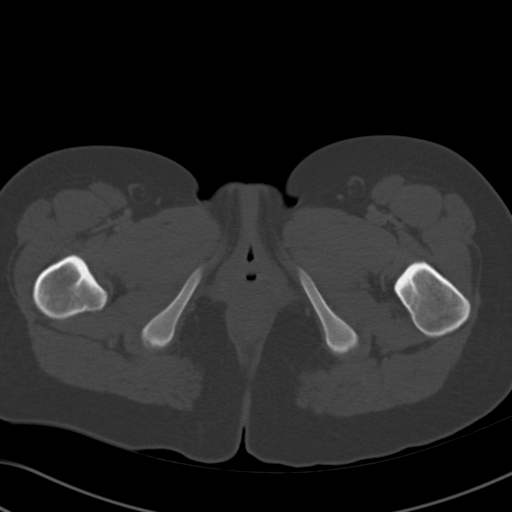
[im 20/145  soft-tissue]
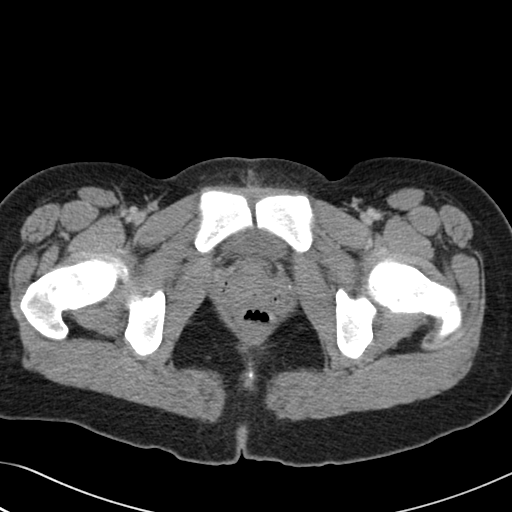
[im 29/145  soft-tissue]
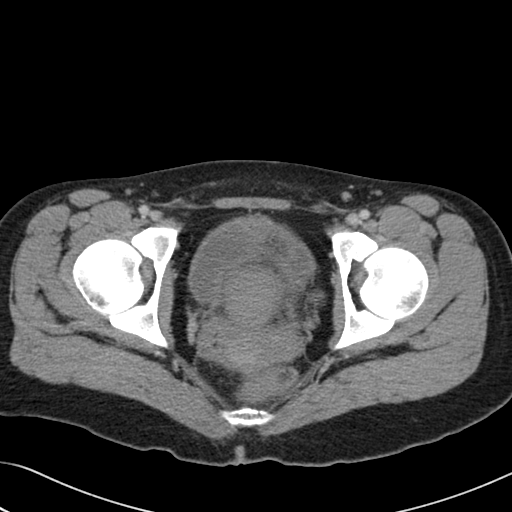
[im 39/145  soft-tissue]
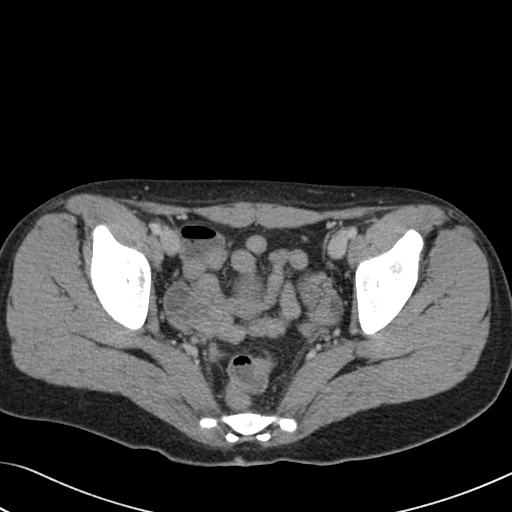
[im 49/145  soft-tissue]
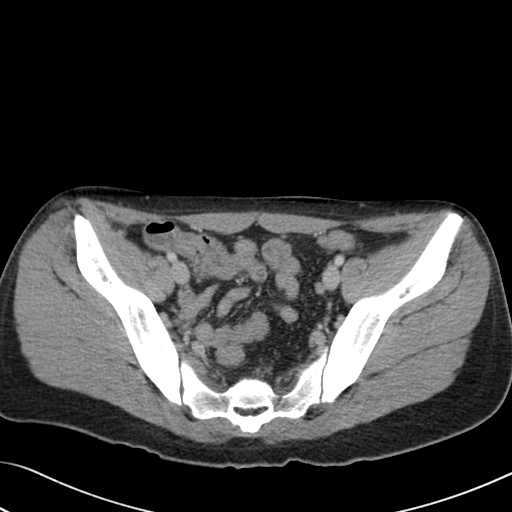
[im 58/145  soft-tissue]
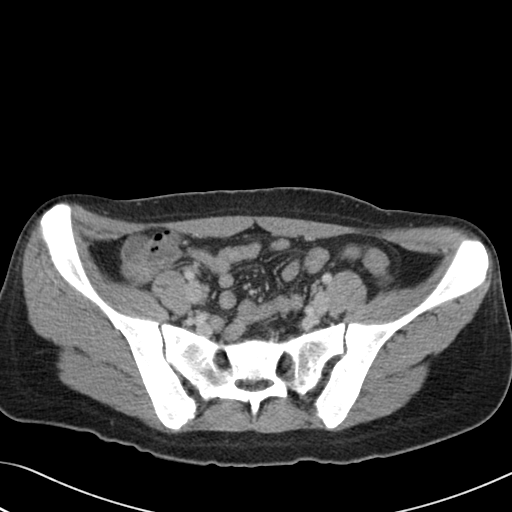
[im 68/145  soft-tissue]
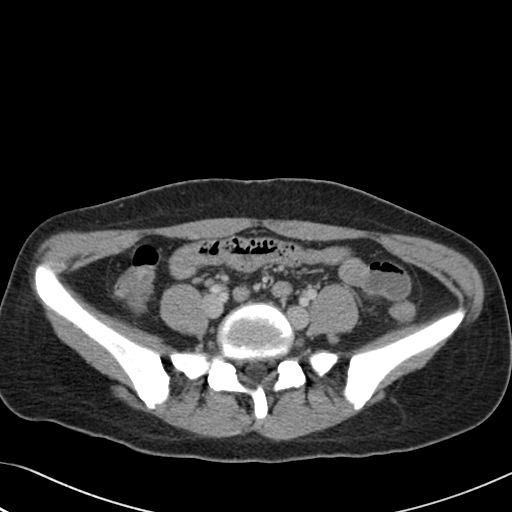
[im 77/145  soft-tissue]
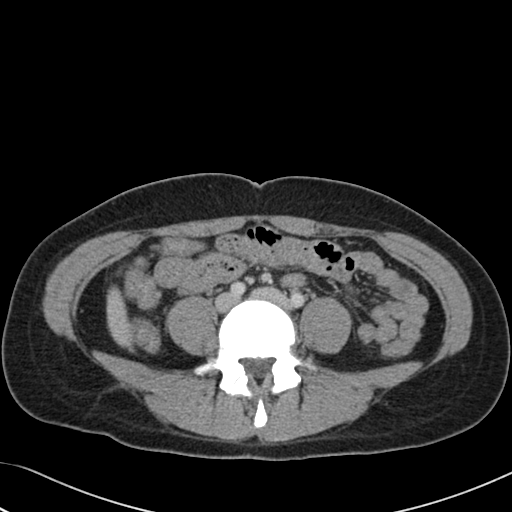
[im 87/145  soft-tissue]
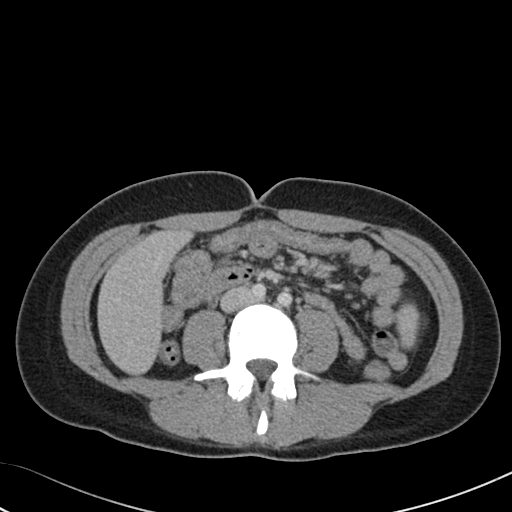
[im 87/145  bone]
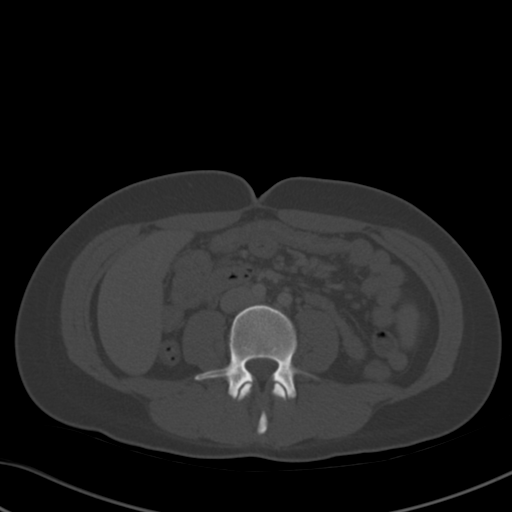
[im 97/145  soft-tissue]
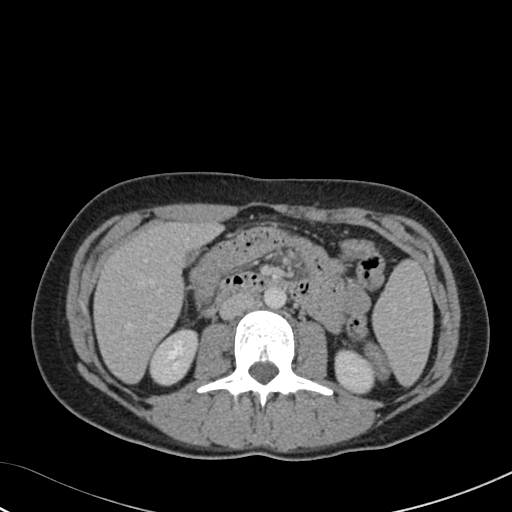
[im 106/145  soft-tissue]
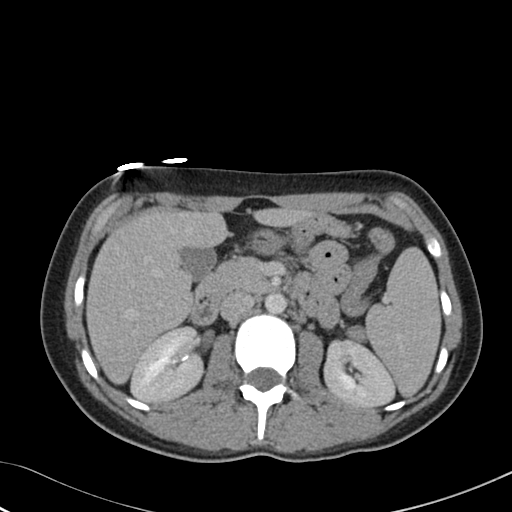
[im 116/145  soft-tissue]
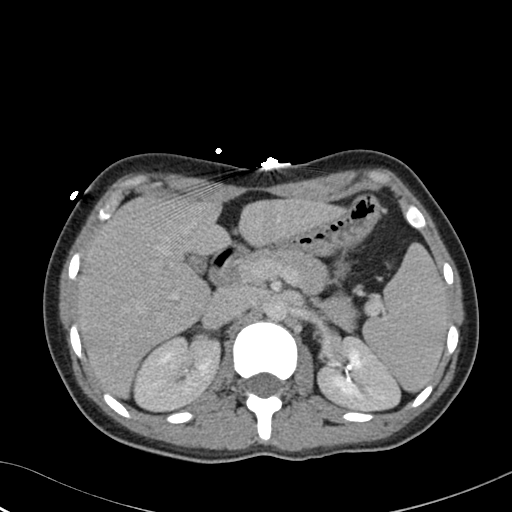
[im 125/145  soft-tissue]
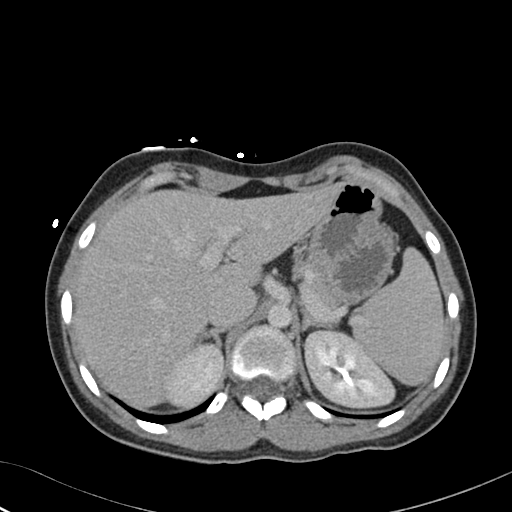
[im 125/145  lung]
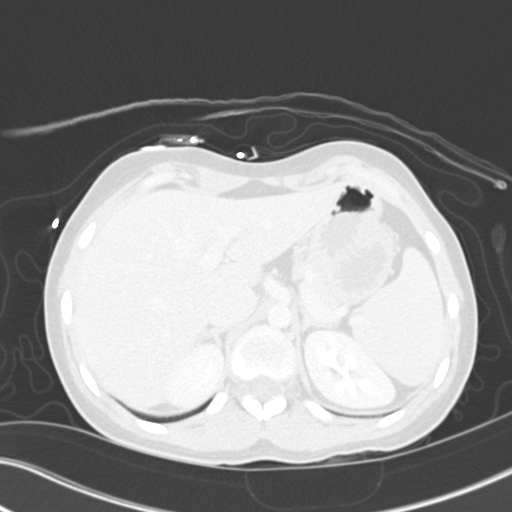
[im 130/145  lung]
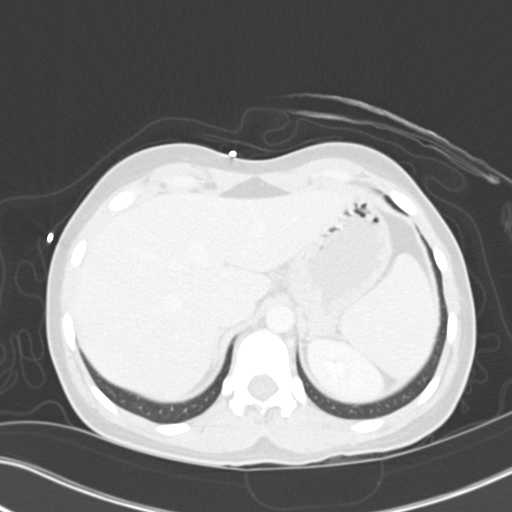
[im 135/145  soft-tissue]
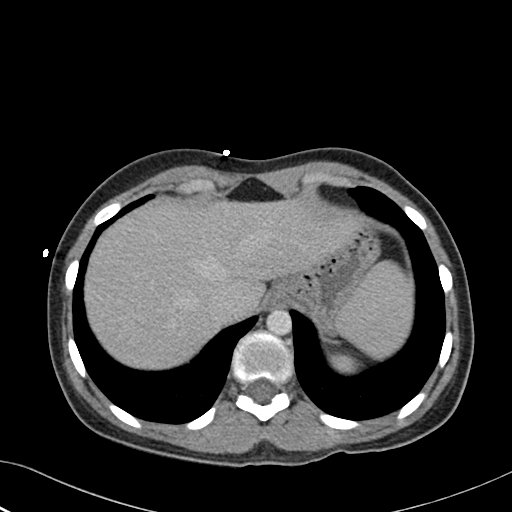
[im 135/145  lung]
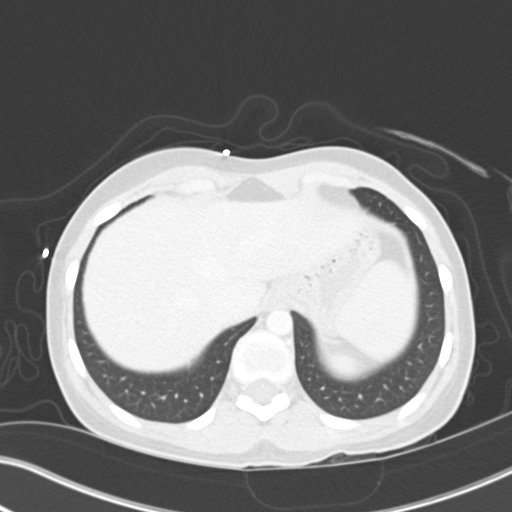
[im 140/145  lung]
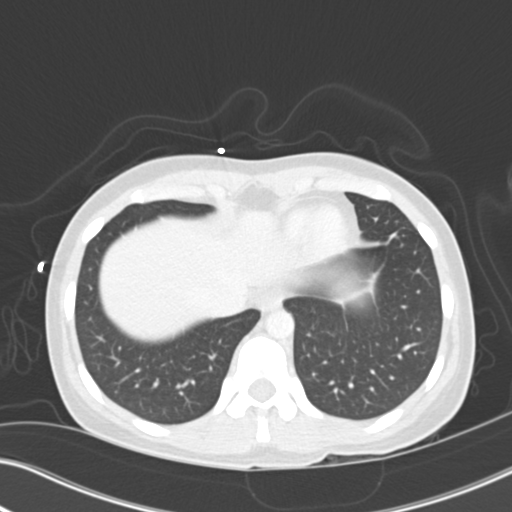

[16 of 32 positions shown; findings below may reference images not displayed]

FINDINGS: The lung bases are unremarkable. The liver, spleen, adrenals,
pancreas, kidneys are unremarkable.

There is no evidence of abdominal aortic aneurysm. The celiac, SMA, IMA,
portal vein are opacified.

There is no evidence of bowel obstruction, enteritis, colitis,
diverticulitis common nor appendicitis. The appendix is identified it is
unremarkable.

There is no evidence of abdominal or pelvic free fluid, loculated fluid
collections, nor adenopathy.
IMPRESSION: Unremarkable CT of the abdomen and pelvis.

## 2014-07-27 ENCOUNTER — Emergency Department
Admission: EM | Admit: 2014-07-27 | Discharge: 2014-07-27 | Disposition: A | Discharge status: Home / Self Care | Payer: Medicaid Other | Attending: Emergency Medicine | Admitting: Emergency Medicine

## 2014-07-27 ENCOUNTER — Encounter: Payer: Self-pay | Admitting: Emergency Medicine

## 2014-07-27 DIAGNOSIS — R51 Headache: Secondary | ICD-10-CM | POA: Diagnosis present

## 2014-07-27 DIAGNOSIS — K088 Other specified disorders of teeth and supporting structures: Secondary | ICD-10-CM | POA: Diagnosis not present

## 2014-07-27 DIAGNOSIS — K029 Dental caries, unspecified: Secondary | ICD-10-CM | POA: Insufficient documentation

## 2014-07-27 DIAGNOSIS — Z720 Tobacco use: Secondary | ICD-10-CM | POA: Diagnosis not present

## 2014-07-27 DIAGNOSIS — K0889 Other specified disorders of teeth and supporting structures: Secondary | ICD-10-CM

## 2014-07-27 HISTORY — DX: Irritable bowel syndrome, unspecified: K58.9

## 2014-07-27 MED ORDER — LIDOCAINE-EPINEPHRINE 2 %-1:100000 IJ SOLN
1.7000 mL | Freq: Once | INTRAMUSCULAR | Status: AC
  Administered 2014-07-27: 1.7 mL

## 2014-07-27 MED ORDER — LIDOCAINE-EPINEPHRINE 2 %-1:100000 IJ SOLN
INTRAMUSCULAR | Status: AC
  Administered 2014-07-27: 1.7 mL
  Filled 2014-07-27: qty 1.7

## 2014-07-27 MED ORDER — AMOXICILLIN 500 MG PO CAPS: 500.0000 mg | ORAL_CAPSULE | Freq: Three times a day (TID) | ORAL | Status: AC

## 2014-07-27 MED ORDER — KETOROLAC TROMETHAMINE 10 MG PO TABS: 10.0000 mg | ORAL_TABLET | Freq: Three times a day (TID) | ORAL | Status: DC

## 2014-07-27 NOTE — ED Notes (Signed)
Pt asking repeatedly for blanket before and after triage.

## 2014-07-27 NOTE — ED Notes (Signed)
Pt presents to ED with c/o pain to the left side of her face. Pt reports history of abscessed tooth to back left mouth and notes that the pain is "right around the same area... It could be that." Pt reports that the pain "kept me up for four hours this morning so I had to come in." Pt is somewhat uncooperative during triage, unwilling to answer some questions.

## 2014-07-27 NOTE — Discharge Instructions (Signed)
OPTIONS FOR DENTAL FOLLOW UP CARE ° °[place] [facility] and [facility] - Local Safety Net Dental Clinics °http://www.ncdhhs.gov/dph/oralhealth/services/safetynetclinics.htm °  °[facility] (336-562-3123) ° °[facility] [place] (919-933-9087) ° °[facility] ([phone] ext [phone]) ° °[facility] ([phone]) ° °[facility] ([phone]) °This clinic caters to the indigent population and is on a lottery system. °Location: °[facility], [address], [place] °Clinic Hours: °Wednesdays from 6pm - 9pm, patients seen by a lottery system. °For dates, call or go to www.med.[facility].edu/[facility]/patients/Dental-[facility] °Services: °Cleanings, fillings and simple extractions. °Payment Options: °DENTAL WORK IS FREE OF CHARGE. Bring proof of income or support. °Best way to get seen: °Arrive at 5:15 pm - this is a lottery, NOT first come/first serve, so arriving earlier will not increase your chances of being seen. °  °  °[facility] °[phone] °Select option 1 for emergencies °  °Location: °[facility], [address], [place] °Clinic Hours: °No walk-ins accepted - call the day before to schedule an appointment. °Check in times are 9:30 am and 1:30 pm. °Services: °Simple extractions, temporary fillings, pulpectomy/pulp debridement, uncomplicated abscess drainage. °Payment Options: °PAYMENT IS DUE AT THE TIME OF SERVICE.  Fee is usually $100-200, additional surgical procedures (e.g. abscess drainage) may be extra. °Cash, checks, Visa/MasterCard accepted.  Can file Medicaid if patient is covered for dental - patient should call case worker to check. °No discount for [facility] patients. °Best way to get seen: °MUST call the day before and get onto the schedule. Can usually be seen the next 1-2 days. No walk-ins accepted. °  °  °[facility] °[phone] °   °Location: °[facility], [address], [place] °Clinic Hours: °M, W, Th, F 8am or 1:30pm, Tues 9a or 1:30 - first come/first served. °Services: °Simple extractions, temporary fillings, uncomplicated abscess drainage.  You do not need to be an [place] resident. °Payment Options: °PAYMENT IS DUE AT THE TIME OF SERVICE. °Dental insurance, otherwise sliding scale - bring proof of income or support. °Depending on income and treatment needed, cost is usually $50-200. °Best way to get seen: °Arrive early as it is first come/first served. °  °  °[facility] °[phone] °  °Location: °[address] °Clinic Hours: °Mon-Thu 8a-5p °Services: °Most basic dental services including extractions and fillings. °Payment Options: °PAYMENT IS DUE AT THE TIME OF SERVICE. °Sliding scale, up to 50% off - bring proof if income or support. °Medicaid with dental option accepted. °Best way to get seen: °Call to schedule an appointment, can usually be seen within 2 weeks OR they will try to see walk-ins - show up at 8a or 2p (you may have to wait). °  °  °[facility] °919-245-2435 °[place] RESIDENTS ONLY °  °Location: °[facility], [address], [place], [place] [address] °Clinic Hours: By appointment only. °Monday - Thursday 8am-5pm, Friday 8am-12pm °Services: Cleanings, fillings, extractions. °Payment Options: °PAYMENT IS DUE AT THE TIME OF SERVICE. °Cash, Visa or MasterCard. Sliding scale - $30 minimum per service. °Best way to get seen: °Come in to office, complete packet and make an appointment - need proof of income °or support monies for each household member and proof of [place] residence. °Usually takes about a month to get in. °  °  °[facility] °[phone] °  °Location: °[address]   [place] °Clinic Hours: Walk-in Urgent Care Dental Services are offered Monday-Friday  mornings only. °The numbers of emergencies accepted daily is limited to the number of °providers available. °Maximum 15 - Mondays, Wednesdays & Thursdays °Maximum 10 - Tuesdays & Fridays °Services: °You do not need to be a [place] resident to be seen for a dental emergency. °Emergencies are defined as pain, swelling, abnormal bleeding, or dental trauma. Walkins will receive x-rays if needed. °NOTE: Dental cleaning is not an emergency. °Payment Options: °PAYMENT IS DUE AT THE TIME OF SERVICE. °Minimum co-pay is $40.00 for uninsured patients. °Minimum co-pay is $3.00 for Medicaid with dental coverage. °Dental Insurance is accepted and must be presented at time of visit. °Medicare does not cover dental. °Forms of payment: Cash, credit card, checks. °Best way to get seen: °If not previously registered with the clinic, walk-in dental registration begins at 7:15 am and is on a first come/first serve basis. °If previously registered with the clinic, call to make an appointment. °  °  °[facility] °[phone] °[place] RESIDENTS ONLY °  °Location: °[address], [place], [place] °Clinic Hours: °Mon-Thu 10a-2p °Services: Extractions only! °Payment Options: °FREE (donations accepted) - bring proof of income or support °Best way to get seen: °Call and schedule an appointment OR come at 8am on the 1st Monday of every month (except for holidays) when it is first come/first served. °  °  °[facility] °[phone] °  °Location: °[address], [place] °Clinic Hours: °Friday mornings °Services, Payment Options, Best way to get seen: °Call for info °

## 2014-07-27 NOTE — ED Provider Notes (Addendum)
[facility] Emergency Department Provider Note  ____________________________________________  Time seen: 7:13 AM on 07/27/2014 -----------------------------------------   I have reviewed the triage vital signs and the nursing notes.   HISTORY  Chief Complaint Facial Pain       HPI Rubbie BattiestMary F Fotopoulos is a 24 y.o. female with a one-week complaint of broken upper molar. She describes pain onset last night, and swelling to the upper and lower jaw. She describes not being able to make an appointment with her dental provider, and reports here for pain relief. She denies fevers, chills, sweats, or nausea/vomiting/diarrhea.  Past Medical History  Diagnosis Date  . Irritable bowel syndrome     There are no active problems to display for this patient.   History reviewed. No pertinent past surgical history.  Current Outpatient Rx  Name  Route  Sig  Dispense  Refill  . amoxicillin (AMOXIL) 500 MG capsule   Oral   Take 1 capsule (500 mg total) by mouth 3 (three) times daily.   30 capsule   0   . ketorolac (TORADOL) 10 MG tablet   Oral   Take 1 tablet (10 mg total) by mouth every 8 (eight) hours.   15 tablet   0     Allergies Review of patient's allergies indicates no known allergies.  No family history on file.  Social History History  Substance Use Topics  . Smoking status: Current Every Day Smoker -- 0.50 packs/day    Types: Cigarettes  . Smokeless tobacco: Not on file  . Alcohol Use: No    Review of Systems  Constitutional: Negative for fever. Eyes: Negative for visual changes. ENT: Negative for sore throat. Cardiovascular: Negative for chest pain. Gastrointestinal: Negative for abdominal pain, vomiting and diarrhea. Skin: Negative for rash.  10-point ROS otherwise negative.  ____________________________________________   PHYSICAL EXAM:  VITAL SIGNS: ED Triage Vitals  Enc Vitals Group     BP 07/27/14 0637 112/74 mmHg     Pulse  Rate 07/27/14 0637 78     Resp 07/27/14 0637 18     Temp 07/27/14 0637 97.7 F (36.5 C)     Temp src --      SpO2 07/27/14 0637 98 %     Weight 07/27/14 0637 160 lb (72.576 kg)     Height 07/27/14 0637 5\' 10"  (1.778 m)     Head Cir --      Peak Flow --      Pain Score 07/27/14 0637 8     Pain Loc --      Pain Edu? --      Excl. in GC? --     Constitutional: Alert and oriented. Well appearing and in no distress. Eyes: Conjunctivae are normal. PERRL. Normal extraocular movements. ENT   Head: Normocephalic and atraumatic.   Nose: No congestion/rhinnorhea.   Mouth/Throat: Mucous membranes are moist. Dental exam reveals chronically broken upper and lower molars on the left side. She has widespread gingivitis, and widespread tartar buildup.   Neck: No stridor. Neck is supple without spasm. Hematological/Lymphatic/Immunilogical: No cervical lymphadenopathy. Cardiovascular: Normal rate, regular rhythm. Normal and symmetric distal pulses are present in all extremities. No murmurs, rubs, or gallops. Respiratory: Normal respiratory effort without tachypnea nor retractions. Breath sounds are clear and equal bilaterally. No wheezes/rales/rhonchi. Neurologic:  Normal speech and language. No gross focal neurologic deficits are appreciated. Speech is normal. No gait instability. Skin:  Skin is warm, dry and intact. No rash noted. Psychiatric: Mood and  affect are normal. Speech and behavior are normal. Patient exhibits appropriate insight and judgment. __________________________________________  PROCEDURES  Verbal Consent obtained. Procedure(s) performed:  Dental Block to #15, #18     Normal prep following verbal consent     Lidocaine 2% w/ epinephrine     0.5 ml instilled at dental root of each tooth     Patient tolerated procedure well Critical Care performed: No ____________________________________________  INITIAL IMPRESSION / ASSESSMENT AND PLAN / ED COURSE  Acute dental  pain due to chronic dental caries. Acute pain relief with local dental block. Patient discharged with Toradol and Amoxicillin. Referral to local dental clinics provided. ____________________________________________  FINAL CLINICAL IMPRESSION(S) / ED DIAGNOSES  Final diagnoses:  Dental caries  Pain, dental    Lissa HoardJenise V Bacon Tashona Calk, PA-C 07/27/14 [id number]  Governor Rooksebecca Lord, MD 07/29/14 0943  Lissa HoardJenise V Bacon Dorita Rowlands, PA-C 08/26/14 1040  Governor Rooksebecca Lord, MD 08/26/14 1416

## 2014-08-02 NOTE — H&P (Signed)
L&D Evaluation:  History Expanded:  HPI 24 yo G2 P1001, EDD of 03/18/14 per 12 week US, presents at 7957w4d with c/o N &V x 2-3 days, and diarrhea, dysuria and leaking fluid today. Also states she has not been able to tolerate any po intake. Denies bloody in emesis or stools. Denies sick contacts. Previous nausea in pregnancy managed with ginger.  [facility], [facility], +Chlamydia with negative TOC on 10/06/13.   Blood Type (Maternal) O positive   Maternal HIV Negative   Maternal Syphilis Ab Nonreactive   Maternal Varicella Immune   Rubella Results (Maternal) immune   Presents with nausea/vomiting, diarrhea   Patient's Medical History IBS (reports sx as abdominal pain)   Patient's Surgical History colonoscopy, tooth extraction   Allergies NKDA   Social History tobacco   Exam:  Vital Signs stable   General appears to feel unwell   Mental Status clear   Chest clear   Heart no murmur/gallop/rubs   Abdomen generalized tenderness to palpation   Edema no edema   Pelvic no external lesions, cervix closed and thick, wet mount: +whiff, +clue, negative yeast   Mebranes Intact   FHT +FHTs   Impression:  Impression IUP at 21 wks, gastroenteritis, BV   Plan:  Comments BV - Rx for Flagyl to take once tolerating po well  UA, CMP, CBC IV fluid - 1 liter bolus, then maintain overnight IV phenergan   Electronic Signatures: Vella KohlerBrothers, Shondale Quinley K (CNM)  (Signed 16-Aug-15 00:45)  Authored: L&D Evaluation   Last Updated: 16-Aug-15 00:45 by Vella KohlerBrothers, Henrick Mcgue K (CNM)

## 2014-08-02 NOTE — H&P (Signed)
L&D Evaluation:  History Expanded:  HPI 24 yo G2 P1001, EDD of 03/18/14 per 12 week US, presents at 163w0d with c/o N &V since midnight this morning.  She has had several episodes of emesis and is unable to keep any fluids down at all.  The vomitus is green in color, no blood.  She has tried to take sips of water, but hasn't tried to eat.  Taking sips of water causes pain.  Her pain is located below her umbilicus.  Denies blood in emesis or stools. Denies sick contacts. +chills, no fevers, + weakness and whole-body aches, +diarrhea.  Also denies dysuria, vaginal symptoms (including discharge, itching, burning, irritation). Previous nausea in pregnancy managed with ginger.  [facility] to care, [facility] at [facility], +Chlamydia with negative TOC on 10/06/13. positive fetal movement, no leakage of fluid, no vb. no contractions.   Gravida 2   Term 1   PreTerm 0   Abortion 0   Living 1   Blood Type (Maternal) O positive   Group B Strep Results Maternal (Result >5wks must be treated as unknown) unknown/result > 5 weeks ago   Maternal HIV Negative   Maternal Syphilis Ab Nonreactive   Maternal Varicella Immune   Rubella Results (Maternal) immune   EDC 18-Mar-2014   Presents with nausea/vomiting, diarrhea   Patient's Medical History IBS (reports sx as abdominal pain)   Patient's Surgical History colonoscopy, tooth extraction   Medications Pre Natal Vitamins   Allergies NKDA   Social History tobacco  denies   Family History Non-Contributory   Exam:  Vital Signs stable  T97.7, P93, BP 103/63   Urine Protein pending   General appears to feel unwell, poor dentition   Mental Status clear   Chest clear   Heart normal sinus rhythm, no murmur/gallop/rubs   Abdomen Mild ttp just below umbilicus over uterus   Fundal Height just above umbilicus   Back no CVAT   Edema no edema   Reflexes 2+   Mebranes Intact   FHT 145   Ucx absent   Skin no  lesions   Impression:  Impression IUP at 26 wks, likely gastroenteritis   Plan:  Comments 1) Labor: no evidence of labor.  No contractions.   2) Fetus - Reassuring given gestational age  433) PNL O poitive / ABSC negative / RI / VZI / HIV neg / RPR NR / HBsAg neg / 1st trimester screen negative / 1-hr OGTT not yet accomplished / GBS unknown  4) TDAP not yet given  5) Nausea/vomiting/abdominal pain: likely gastroenteritis.  She has IBS and has been admitted with a similar problem before.  Will get abdominal labs, IVF resuscitate with 1L LR.  Anti-emetics PRN.  Will give PO challenge once evaluation   Electronic Signatures: Conard NovakJackson, Tyner Codner D (MD)  (Signed 18-Sep-15 15:19)  Authored: L&D Evaluation   Last Updated: 18-Sep-15 15:19 by Conard NovakJackson, Moranda Billiot D (MD)

## 2014-08-02 NOTE — H&P (Signed)
L&D Evaluation:  History Expanded:  HPI 24 yo G2 P1001, EDD of 03/18/14 per 12 week US, presents at 6674w2d with c/o abdominal cramping. Pt was evaluated in ER for earache & told she had fluid in her ear. Given Rx for Norco per ER and 1 dose earlier, which offered mild relief. Pt reports cramping started while she was in ER. C/o diarrhea & nausea yesterday.  [facility] for [facility] to care, [facility], +Chlamydia with negative TOC on 10/06/13. Elevated early 1 hr, has 3 hr scheduled.   Blood Type (Maternal) O positive   Group B Strep Results Maternal (Result >5wks must be treated as unknown) unknown/result > 5 weeks ago   Maternal HIV Negative   Maternal Syphilis Ab Nonreactive   Maternal Varicella Immune   Rubella Results (Maternal) immune   Patient's Medical History IBS (reports sx as abdominal pain)   Patient's Surgical History colonoscopy, tooth extraction   Medications Pre Natal Vitamins   Allergies NKDA   Social History none   Exam:  Vital Signs stable   General appears uncomfortable   Mental Status clear   Abdomen gravid, non-tender   Edema no edema   Pelvic no external lesions, cervix closed and thick   Mebranes Intact   FHT normal rate with no decels, category 1 tracing - baseline 110-120, moderate variability, + accelerations   Ucx absent   Impression:  Impression IUP at 33wks without evidence of PTL, ear and tooth aches   Plan:  Comments Will given 1 dose of Percocet to help pt rest overnight. Already has Rx for Norco. Advised to f/u with ENT and or dentist on Monday.  Advised of using Narcotics sparingly during pregnancy.   Follow Up Appointment already scheduled   Electronic Signatures: Vella KohlerBrothers, Nykira Reddix K (CNM)  [phone](Signed 08-Nov-15 00:15)  Authored: L&D Evaluation   Last Updated: 08-Nov-15 00:15 by Vella KohlerBrothers, Madhuri Vacca K (CNM)

## 2015-01-11 LAB — HM PAP SMEAR: HM Pap smear: NEGATIVE

## 2015-03-13 IMAGING — CR DG ABDOMEN 3V
1 series · 3 of 3 positions shown · non-contrast
Comparison: none

REASON FOR EXAM: vomiting
COMMENTS:   May transport without cardiac monitor

[Series 1: pa · 0.17mm/px · 3 of 3 slices shown]
[im 1/3]
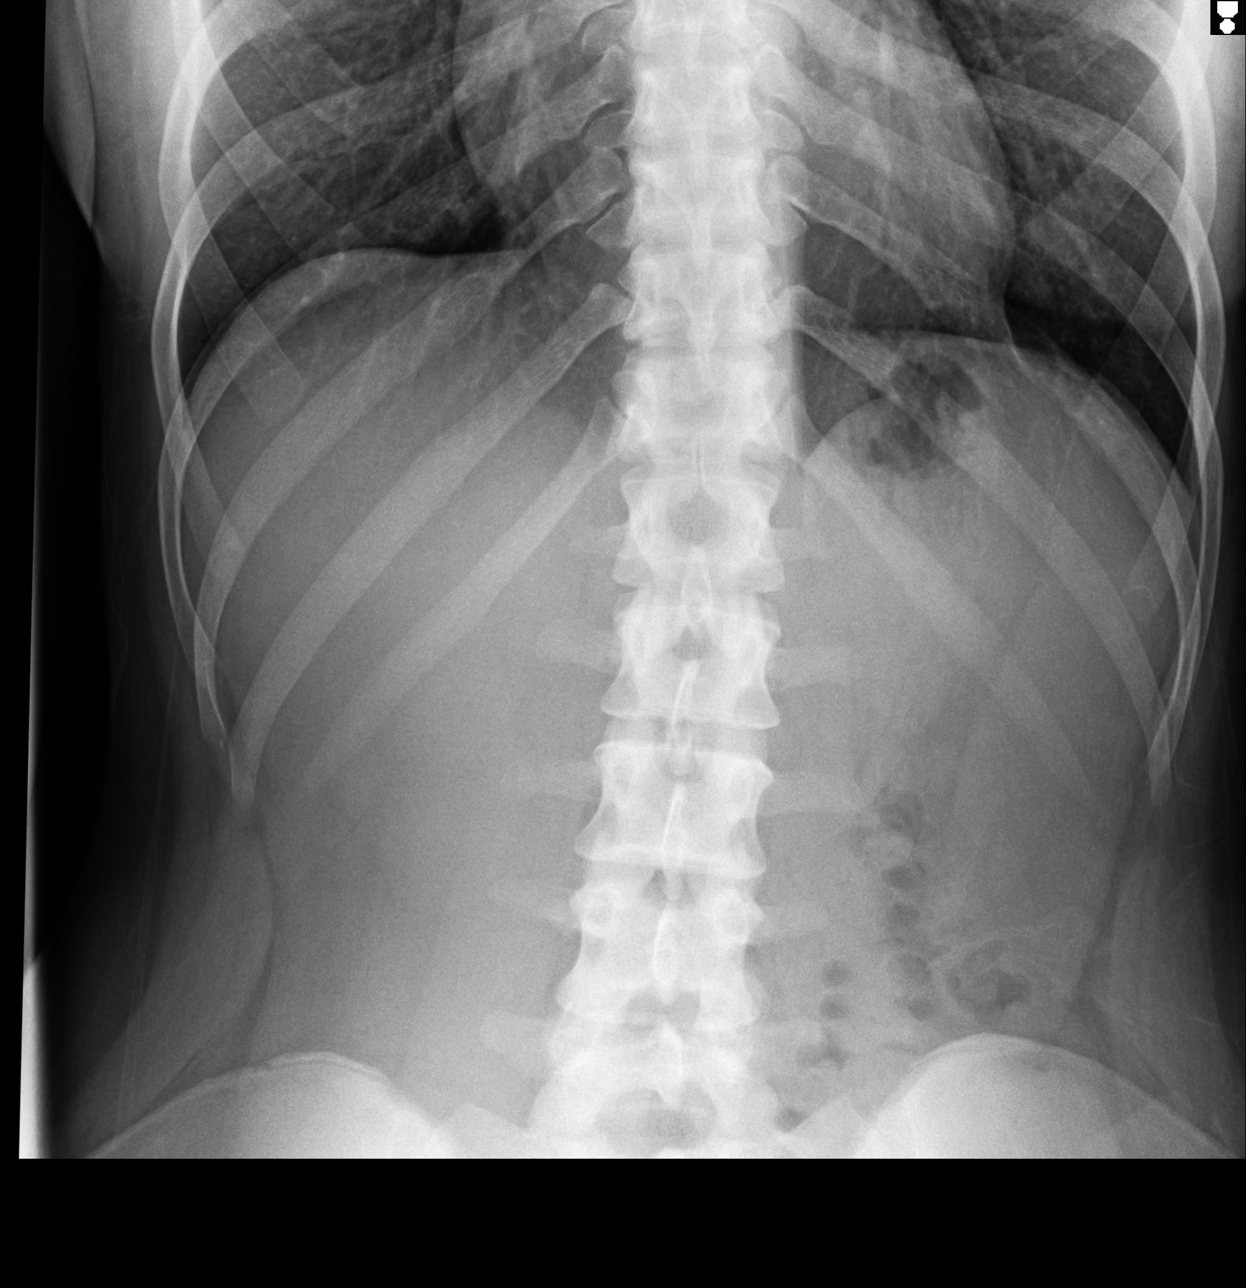
[im 2/3]
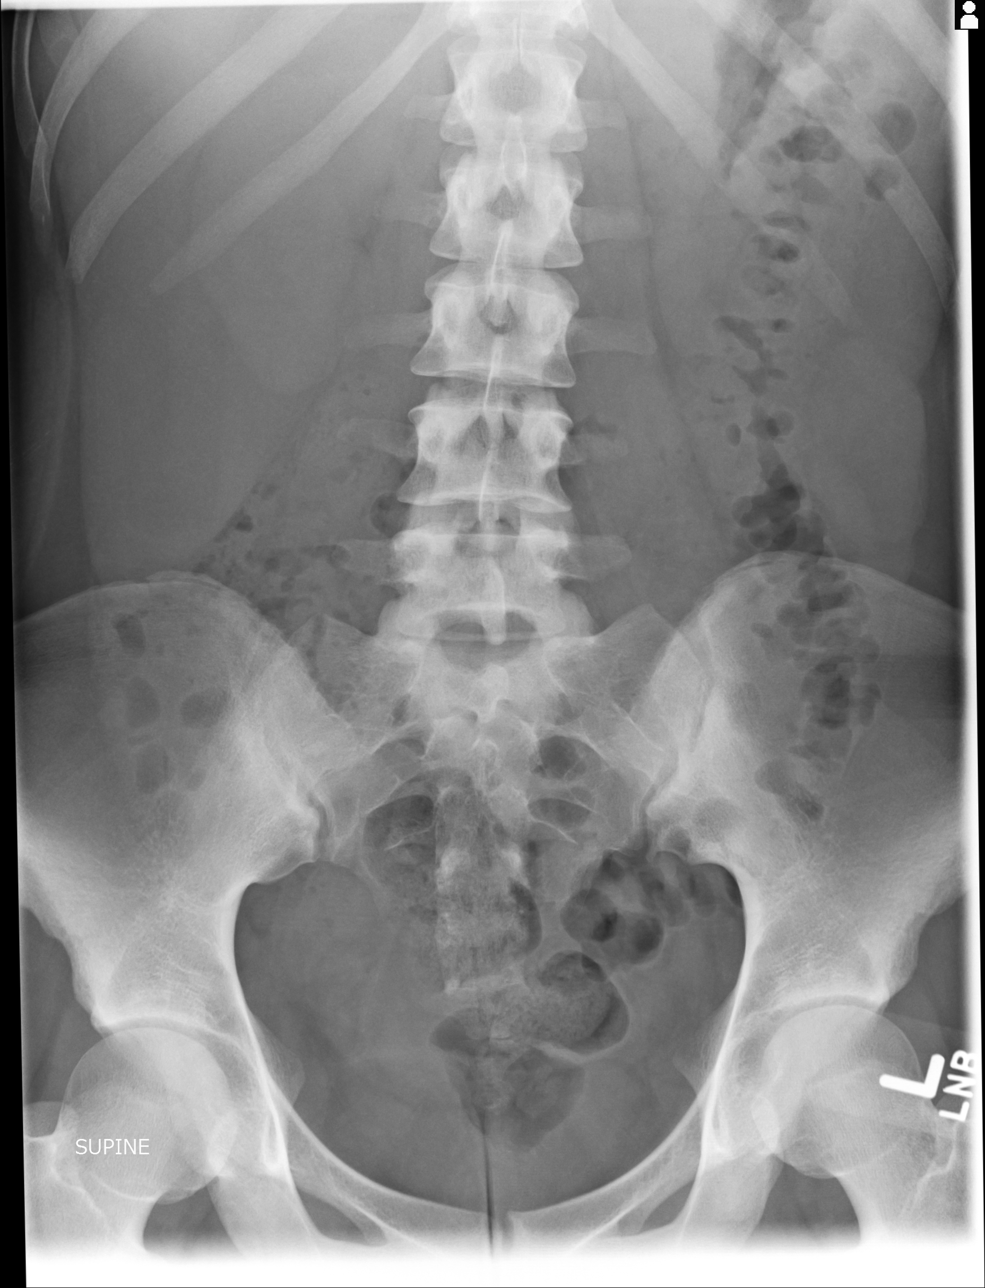
[im 3/3]
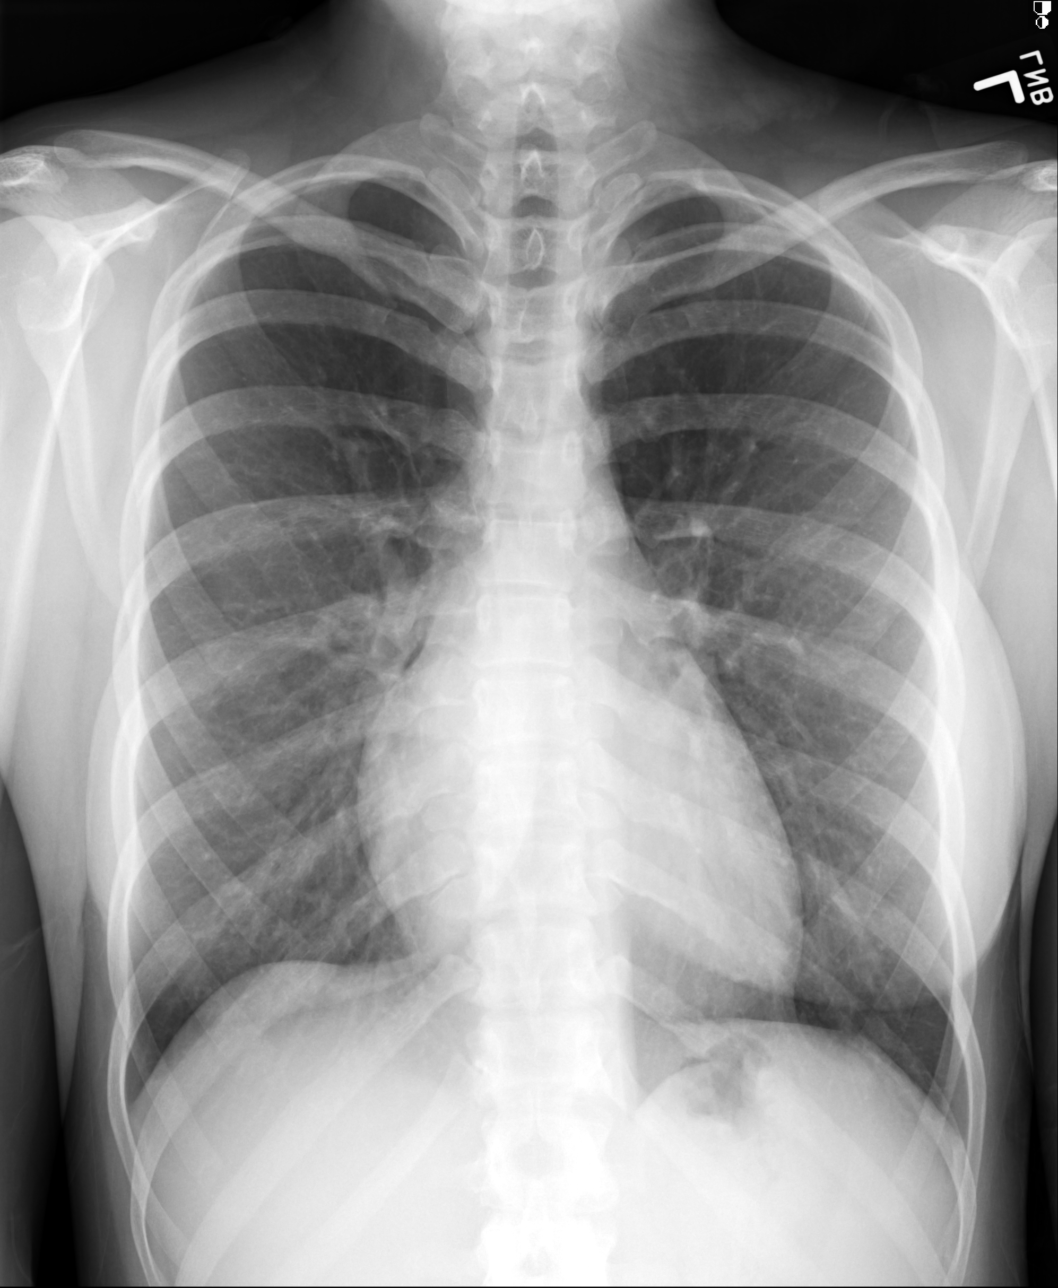

[3 of 3 positions shown; findings below may reference images not displayed]

PROCEDURE:     DXR - DXR ABDOMEN 3-WAY (INCL PA CXR)  - December 25, 2012  [DATE]

RESULT:     The lungs are well-expanded. The cardiac silhouette is normal in
size. The mediastinum is normal in width. Within the abdomen the bowel gas
pattern is within the limits of normal. There is no evidence of ileus or
obstruction. There are no abnormal soft tissue calcifications. The observed
bony structures appear normal.
IMPRESSION: 1. No acute intra-abdominal abnormality is demonstrated.
2. There is no evidence of acute cardiopulmonary abnormality.

[REDACTED]

## 2015-08-30 DIAGNOSIS — R102 Pelvic and perineal pain: Secondary | ICD-10-CM | POA: Diagnosis not present

## 2015-08-30 DIAGNOSIS — F17210 Nicotine dependence, cigarettes, uncomplicated: Secondary | ICD-10-CM | POA: Insufficient documentation

## 2015-08-30 DIAGNOSIS — N83201 Unspecified ovarian cyst, right side: Secondary | ICD-10-CM | POA: Diagnosis not present

## 2015-08-30 DIAGNOSIS — R1013 Epigastric pain: Secondary | ICD-10-CM | POA: Diagnosis present

## 2015-08-30 DIAGNOSIS — Z791 Long term (current) use of non-steroidal anti-inflammatories (NSAID): Secondary | ICD-10-CM | POA: Insufficient documentation

## 2015-08-30 LAB — URINALYSIS COMPLETE WITH MICROSCOPIC (ARMC ONLY)
Bilirubin Urine: NEGATIVE
Glucose, UA: NEGATIVE mg/dL
Hgb urine dipstick: NEGATIVE
Ketones, ur: NEGATIVE mg/dL
Leukocytes, UA: NEGATIVE
Nitrite: NEGATIVE
PROTEIN: NEGATIVE mg/dL
SPECIFIC GRAVITY, URINE: 1.01 (ref 1.005–1.030)
pH: 6 (ref 5.0–8.0)

## 2015-08-30 LAB — POCT PREGNANCY, URINE: Preg Test, Ur: NEGATIVE

## 2015-08-30 LAB — COMPREHENSIVE METABOLIC PANEL
ALT: 12 U/L — AB (ref 14–54)
AST: 16 U/L (ref 15–41)
Albumin: 4.6 g/dL (ref 3.5–5.0)
Alkaline Phosphatase: 72 U/L (ref 38–126)
Anion gap: 5 (ref 5–15)
BILIRUBIN TOTAL: 0.3 mg/dL (ref 0.3–1.2)
BUN: 12 mg/dL (ref 6–20)
CO2: 26 mmol/L (ref 22–32)
Calcium: 9.2 mg/dL (ref 8.9–10.3)
Chloride: 107 mmol/L (ref 101–111)
Creatinine, Ser: 0.66 mg/dL (ref 0.44–1.00)
GFR calc Af Amer: >60 mL/min (ref >60)
GFR calc non Af Amer: >60 mL/min (ref >60)
GLUCOSE: 120 mg/dL — AB (ref 65–99)
Potassium: 3.2 mmol/L — ABNORMAL LOW (ref 3.5–5.1)
SODIUM: 138 mmol/L (ref 135–145)
Total Protein: 7.6 g/dL (ref 6.5–8.1)

## 2015-08-30 LAB — CBC
HCT: 39.3 % (ref 35.0–47.0)
HEMOGLOBIN: 13.8 g/dL (ref 12.0–16.0)
MCH: 31 pg (ref 26.0–34.0)
MCHC: 35.1 g/dL (ref 32.0–36.0)
MCV: 88.5 fL (ref 80.0–100.0)
Platelets: 163 10*3/uL (ref 150–440)
RBC: 4.44 MIL/uL (ref 3.80–5.20)
RDW: 13.9 % (ref 11.5–14.5)
WBC: 5.6 10*3/uL (ref 3.6–11.0)

## 2015-08-30 LAB — LIPASE, BLOOD: Lipase: 21 U/L (ref 11–51)

## 2015-08-30 MED ORDER — LIDOCAINE HCL (PF) 1 % IJ SOLN
INTRAMUSCULAR | Status: AC
  Filled 2015-08-30: qty 5

## 2015-08-30 NOTE — ED Notes (Signed)
Pt arrives to ER via POV c/o intermittent abdominal pain and headache x 1 day. Pt alert and oriented X4, active, cooperative, pt in NAD. RR even and unlabored, color WNL.

## 2015-08-31 ENCOUNTER — Emergency Department: Payer: Medicaid Other

## 2015-08-31 ENCOUNTER — Emergency Department
Admission: EM | Admit: 2015-08-31 | Discharge: 2015-08-31 | Disposition: A | Discharge status: Home / Self Care | Payer: Medicaid Other | Attending: Emergency Medicine | Admitting: Emergency Medicine

## 2015-08-31 DIAGNOSIS — R102 Pelvic and perineal pain: Secondary | ICD-10-CM

## 2015-08-31 DIAGNOSIS — N83201 Unspecified ovarian cyst, right side: Secondary | ICD-10-CM

## 2015-08-31 MED ORDER — OXYCODONE-ACETAMINOPHEN 5-325 MG PO TABS: 1.0000 | ORAL_TABLET | ORAL | Status: DC | PRN

## 2015-08-31 MED ORDER — GI COCKTAIL ~~LOC~~
30.0000 mL | Freq: Once | ORAL | Status: AC
  Administered 2015-08-31: 30 mL via ORAL
  Filled 2015-08-31: qty 30

## 2015-08-31 MED ORDER — OXYCODONE-ACETAMINOPHEN 5-325 MG PO TABS
1.0000 | ORAL_TABLET | Freq: Once | ORAL | Status: AC
Start: 2015-08-31 — End: 2015-08-31
  Administered 2015-08-31: 1 via ORAL
  Filled 2015-08-31: qty 1

## 2015-08-31 NOTE — ED Provider Notes (Signed)
[facility] Emergency Department Provider Note  ____________________________________________  Time seen: 2:08 AM  I have reviewed the triage vital signs and the nursing notes.   HISTORY  Chief Complaint Abdominal Pain and Headache      HPI Kylie [name] is a 25 y.o. female presents with abdominal pain and nausea 3 days. However patient admits to similar discomfort and nausea over the past 2 weeks. Patient points to the epigastrium and right pelvic area as location of her pain Patient denies any vomiting or diarrhea. Patient admits to urinary urgency but no dysuria. Patient states current pain score is 7 out of 10. She denies any aggravating or alleviating factors for her pain.    Past Medical History  Diagnosis Date  . Irritable bowel syndrome     There are no active problems to display for this patient.   Past surgical history None  Current Outpatient Rx  Name  Route  Sig  Dispense  Refill  . ketorolac (TORADOL) 10 MG tablet   Oral   Take 1 tablet (10 mg total) by mouth every 8 (eight) hours.   15 tablet   0   . oxyCODONE-acetaminophen (PERCOCET/ROXICET) 5-325 MG tablet   Oral   Take 1 tablet by mouth every 4 (four) hours as needed for severe pain.   20 tablet   0     Allergies No known drug allergies No family history on file.  Social History Social History  Substance Use Topics  . Smoking status: Current Every Day Smoker -- 0.50 packs/day    Types: Cigarettes  . Smokeless tobacco: None  . Alcohol Use: No    Review of Systems  Constitutional: Negative for fever. Eyes: Negative for visual changes. ENT: Negative for sore throat. Cardiovascular: Negative for chest pain. Respiratory: Negative for shortness of breath. Gastrointestinal: Positive for abdominal pain Genitourinary: Negative for dysuria. Musculoskeletal: Negative for back pain. Skin: Negative for rash. Neurological: Negative for headaches, focal weakness or  numbness.    10-point ROS otherwise negative.  ____________________________________________   PHYSICAL EXAM:  VITAL SIGNS: ED Triage Vitals  Enc Vitals Group     BP 08/30/15 2230 118/75 mmHg     Pulse Rate 08/30/15 2230 89     Resp 08/31/15 0130 22     Temp 08/30/15 2230 98.4 F (36.9 C)     Temp Source 08/30/15 2230 Oral     SpO2 08/30/15 2230 100 %     Weight 08/30/15 2230 140 lb (63.504 kg)     Height 08/30/15 2230 5\' 10"  (1.778 m)     Head Cir --      Peak Flow --      Pain Score 08/30/15 2232 7     Pain Loc --      Pain Edu? --      Excl. in GC? --      Constitutional: Alert and oriented. Well appearing and in no distress. Eyes: Conjunctivae are normal. PERRL. Normal extraocular movements. ENT   Head: Normocephalic and atraumatic.   Nose: No congestion/rhinnorhea.   Mouth/Throat: Mucous membranes are moist.   Neck: No stridor. Hematological/Lymphatic/Immunilogical: No cervical lymphadenopathy. Cardiovascular: Normal rate, regular rhythm. Normal and symmetric distal pulses are present in all extremities. No murmurs, rubs, or gallops. Respiratory: Normal respiratory effort without tachypnea nor retractions. Breath sounds are clear and equal bilaterally. No wheezes/rales/rhonchi. Gastrointestinal: Right pelvic pain with palpation. No distention. There is no CVA tenderness. Genitourinary: deferred Musculoskeletal: Nontender with normal range of motion  in all extremities. No joint effusions.  No lower extremity tenderness nor edema. Neurologic:  Normal speech and language. No gross focal neurologic deficits are appreciated. Speech is normal.  Skin:  Skin is warm, dry and intact. No rash noted. Psychiatric: Mood and affect are normal. Speech and behavior are normal. Patient exhibits appropriate insight and judgment.  ____________________________________________    LABS (pertinent positives/negatives)  Labs Reviewed  COMPREHENSIVE METABOLIC PANEL -  Abnormal; Notable for the following:    Potassium 3.2 (*)    Glucose, Bld 120 (*)    ALT 12 (*)    All other components within normal limits  URINALYSIS COMPLETEWITH MICROSCOPIC ([facility] ONLY) - Abnormal; Notable for the following:    Color, Urine YELLOW (*)    APPearance CLEAR (*)    Bacteria, UA RARE (*)    Squamous Epithelial / LPF 0-5 (*)    All other components within normal limits  LIPASE, BLOOD  CBC  POC URINE PREG, ED  POCT PREGNANCY, URINE         RADIOLOGY  US Pelvis Complete (Final result) Result time: 08/31/15 03:43:51   Final result by Rad Results In Interface (08/31/15 03:43:51)   Narrative:   CLINICAL DATA: 25 year old female with pelvic pain  EXAM: TRANSABDOMINAL AND TRANSVAGINAL ULTRASOUND OF PELVIS  TECHNIQUE: Both transabdominal and transvaginal ultrasound examinations of the pelvis were performed. Transabdominal technique was performed for global imaging of the pelvis including uterus, ovaries, adnexal regions, and pelvic cul-de-sac. It was necessary to proceed with endovaginal exam following the transabdominal exam to visualize the endometrium and the ovaries.  COMPARISON: Ultrasound dated 02/05/2013  FINDINGS: Uterus  The uterus is anteverted and slightly heterogeneous and measures 7.1 x 3.5 x 4.7 cm. No fibroids or other mass visualized.  Endometrium  Thickness: 3 mm. No focal abnormality visualized.  Right ovary  Measurements: 4.9 x 2.9 x 3.9 cm. There is a 3.1 x 2.1 x 2.7 cm simple cyst in the right ovary.  Left ovary  Measurements: 2.8 x 1.7 x 2.7 cm. Normal appearance/no adnexal mass.  Other findings  No abnormal free fluid.  IMPRESSION: Right ovarian simple cyst otherwise unremarkable pelvic ultrasound.   Electronically Signed By: Elgie CollardArash Radparvar M.D. On: 08/31/2015 03:43          US Transvaginal Non-OB (Final result) Result time: 08/31/15 03:43:51   Final result by Rad Results In Interface (08/31/15  03:43:51)   Narrative:   CLINICAL DATA: 25 year old female with pelvic pain  EXAM: TRANSABDOMINAL AND TRANSVAGINAL ULTRASOUND OF PELVIS  TECHNIQUE: Both transabdominal and transvaginal ultrasound examinations of the pelvis were performed. Transabdominal technique was performed for global imaging of the pelvis including uterus, ovaries, adnexal regions, and pelvic cul-de-sac. It was necessary to proceed with endovaginal exam following the transabdominal exam to visualize the endometrium and the ovaries.  COMPARISON: Ultrasound dated 02/05/2013  FINDINGS: Uterus  The uterus is anteverted and slightly heterogeneous and measures 7.1 x 3.5 x 4.7 cm. No fibroids or other mass visualized.  Endometrium  Thickness: 3 mm. No focal abnormality visualized.  Right ovary  Measurements: 4.9 x 2.9 x 3.9 cm. There is a 3.1 x 2.1 x 2.7 cm simple cyst in the right ovary.  Left ovary  Measurements: 2.8 x 1.7 x 2.7 cm. Normal appearance/no adnexal mass.  Other findings  No abnormal free fluid.  IMPRESSION: Right ovarian simple cyst otherwise unremarkable pelvic ultrasound.   Electronically Signed By: Elgie CollardArash Radparvar M.D. On: 08/31/2015 03:43  INITIAL IMPRESSION / ASSESSMENT AND PLAN / ED COURSE  Pertinent labs & imaging results that were available during my care of the patient were reviewed by me and considered in my medical decision making (see chart for details).  Patient received a GI cocktail and Percocet emergency department with resolution of pain  ____________________________________________   FINAL CLINICAL IMPRESSION(S) / ED DIAGNOSES  Final diagnoses:  Pelvic pain in female  Right ovarian cyst      Darci Current, MD 08/31/15 0430

## 2015-08-31 NOTE — ED Notes (Signed)
Patient transported to Ultrasound 

## 2015-08-31 NOTE — ED Notes (Signed)
MD Brown at bedside.

## 2015-08-31 NOTE — ED Notes (Signed)
Pt returned from U/S via stretcher. 

## 2015-08-31 NOTE — ED Notes (Signed)
Pt called father and brother for ride home.

## 2015-08-31 NOTE — ED Notes (Signed)
Pt reports abdominal pain/nausea X 2 days. Pt reports frequent urge to urinate/deficate without being able to produce urine/stool. Pt denies diarrhea/vomiting.

## 2015-10-07 ENCOUNTER — Emergency Department: Payer: Medicaid Other

## 2015-10-07 ENCOUNTER — Emergency Department
Admission: EM | Admit: 2015-10-07 | Discharge: 2015-10-07 | Disposition: A | Discharge status: Home / Self Care | Payer: Medicaid Other | Attending: Emergency Medicine | Admitting: Emergency Medicine

## 2015-10-07 ENCOUNTER — Encounter: Payer: Self-pay | Admitting: Emergency Medicine

## 2015-10-07 DIAGNOSIS — F418 Other specified anxiety disorders: Secondary | ICD-10-CM | POA: Insufficient documentation

## 2015-10-07 DIAGNOSIS — R079 Chest pain, unspecified: Secondary | ICD-10-CM | POA: Diagnosis present

## 2015-10-07 DIAGNOSIS — J45909 Unspecified asthma, uncomplicated: Secondary | ICD-10-CM | POA: Diagnosis not present

## 2015-10-07 DIAGNOSIS — F17210 Nicotine dependence, cigarettes, uncomplicated: Secondary | ICD-10-CM | POA: Insufficient documentation

## 2015-10-07 DIAGNOSIS — F329 Major depressive disorder, single episode, unspecified: Secondary | ICD-10-CM

## 2015-10-07 DIAGNOSIS — F32A Depression, unspecified: Secondary | ICD-10-CM

## 2015-10-07 DIAGNOSIS — F419 Anxiety disorder, unspecified: Secondary | ICD-10-CM

## 2015-10-07 HISTORY — DX: Unspecified asthma, uncomplicated: J45.909

## 2015-10-07 LAB — URINALYSIS COMPLETE WITH MICROSCOPIC (ARMC ONLY)
BILIRUBIN URINE: NEGATIVE
Glucose, UA: NEGATIVE mg/dL
Hgb urine dipstick: NEGATIVE
KETONES UR: NEGATIVE mg/dL
LEUKOCYTES UA: NEGATIVE
NITRITE: NEGATIVE
PH: 7 (ref 5.0–8.0)
PROTEIN: NEGATIVE mg/dL
RBC / HPF: NONE SEEN RBC/hpf (ref 0–5)
SPECIFIC GRAVITY, URINE: 1.006 (ref 1.005–1.030)

## 2015-10-07 LAB — BASIC METABOLIC PANEL
Anion gap: 6 (ref 5–15)
BUN: 10 mg/dL (ref 6–20)
CALCIUM: 9.3 mg/dL (ref 8.9–10.3)
CO2: 26 mmol/L (ref 22–32)
Chloride: 106 mmol/L (ref 101–111)
Creatinine, Ser: 0.52 mg/dL (ref 0.44–1.00)
GFR calc Af Amer: >60 mL/min (ref >60)
GLUCOSE: 94 mg/dL (ref 65–99)
Potassium: 3.7 mmol/L (ref 3.5–5.1)
Sodium: 138 mmol/L (ref 135–145)

## 2015-10-07 LAB — CBC
HEMATOCRIT: 39.6 % (ref 35.0–47.0)
HEMOGLOBIN: 14.1 g/dL (ref 12.0–16.0)
MCH: 31.7 pg (ref 26.0–34.0)
MCHC: 35.5 g/dL (ref 32.0–36.0)
MCV: 89.3 fL (ref 80.0–100.0)
Platelets: 158 10*3/uL (ref 150–440)
RBC: 4.44 MIL/uL (ref 3.80–5.20)
RDW: 13.4 % (ref 11.5–14.5)
WBC: 5.3 10*3/uL (ref 3.6–11.0)

## 2015-10-07 LAB — POCT PREGNANCY, URINE: Preg Test, Ur: NEGATIVE

## 2015-10-07 LAB — TROPONIN I

## 2015-10-07 MED ORDER — LORAZEPAM 1 MG PO TABS
1.0000 mg | ORAL_TABLET | Freq: Once | ORAL | Status: AC
  Administered 2015-10-07: 1 mg via ORAL
  Filled 2015-10-07: qty 1

## 2015-10-07 NOTE — Discharge Instructions (Signed)
° °Generalized Anxiety Disorder °Generalized anxiety disorder (GAD) is a mental disorder. It interferes with life functions, including relationships, work, and school. °GAD is different from normal anxiety, which everyone experiences at some point in their lives in response to specific life events and activities. Normal anxiety actually helps us prepare for and get through these life events and activities. Normal anxiety goes away after the event or activity is over.  °GAD causes anxiety that is not necessarily related to specific events or activities. It also causes excess anxiety in proportion to specific events or activities. The anxiety associated with GAD is also difficult to control. GAD can vary from mild to severe. People with severe GAD can have intense waves of anxiety with physical symptoms (panic attacks).  °SYMPTOMS °The anxiety and worry associated with GAD are difficult to control. This anxiety and worry are related to many life events and activities and also occur more days than not for 6 months or longer. People with GAD also have three or more of the following symptoms (one or more in children): °· Restlessness.   °· Fatigue. °· Difficulty concentrating.   °· Irritability. °· Muscle tension. °· Difficulty sleeping or unsatisfying sleep. °DIAGNOSIS °GAD is diagnosed through an assessment by your health care provider. Your health care provider will ask you questions about your mood, physical symptoms, and events in your life. Your health care provider may ask you about your medical history and use of alcohol or drugs, including prescription medicines. Your health care provider may also do a physical exam and blood tests. Certain medical conditions and the use of certain substances can cause symptoms similar to those associated with GAD. Your health care provider may refer you to a mental health specialist for further evaluation. °TREATMENT °The following therapies are usually used to treat GAD:   °· Medication. Antidepressant medication usually is prescribed for long-term daily control. Antianxiety medicines may be added in severe cases, especially when panic attacks occur.   °· Talk therapy (psychotherapy). Certain types of talk therapy can be helpful in treating GAD by providing support, education, and guidance. A form of talk therapy called cognitive behavioral therapy can teach you healthy ways to think about and react to daily life events and activities. °· Stress management techniques. These include yoga, meditation, and exercise and can be very helpful when they are practiced regularly. °A mental health specialist can help determine which treatment is best for you. Some people see improvement with one therapy. However, other people require a combination of therapies. °  °This information is not intended to replace advice given to you by your health care provider. Make sure you discuss any questions you have with your health care provider. °  °Document Released: 07/06/2012 Document Revised: 04/01/2014 Document Reviewed: 07/06/2012 °Elsevier Interactive Patient Education ©2016 [employer] ° °Major Depressive Disorder °Major depressive disorder is a mental illness. It also may be called clinical depression or unipolar depression. Major depressive disorder usually causes feelings of sadness, hopelessness, or helplessness. Some people with this disorder do not feel particularly sad but lose interest in doing things they used to enjoy (anhedonia). Major depressive disorder also can cause physical symptoms. It can interfere with work, school, relationships, and other normal everyday activities. The disorder varies in severity but is longer lasting and more serious than the sadness we all feel from time to time in our lives. °Major depressive disorder often is triggered by stressful life events or major life changes. Examples of these triggers include divorce, loss of your job or home,   a move, and the  death of a family member or close friend. Sometimes this disorder occurs for no obvious reason at all. People who have family members with major depressive disorder or bipolar disorder are at higher risk for developing this disorder, with or without life stressors. Major depressive disorder can occur at any age. It may occur just once in your life (single episode major depressive disorder). It may occur multiple times (recurrent major depressive disorder). °SYMPTOMS °People with major depressive disorder have either anhedonia or depressed mood on nearly a daily basis for at least 2 weeks or longer. Symptoms of depressed mood include: °· Feelings of sadness (blue or down in the dumps) or emptiness. °· Feelings of hopelessness or helplessness. °· Tearfulness or episodes of crying (may be observed by others). °· Irritability (children and adolescents). °In addition to depressed mood or anhedonia or both, people with this disorder have at least four of the following symptoms: °· Difficulty sleeping or sleeping too much.   °· Significant change (increase or decrease) in appetite or weight.   °· Lack of energy or motivation. °· Feelings of guilt and worthlessness.   °· Difficulty concentrating, remembering, or making decisions. °· Unusually slow movement (psychomotor retardation) or restlessness (as observed by others).   °· Recurrent wishes for death, recurrent thoughts of self-harm (suicide), or a suicide attempt. °People with major depressive disorder commonly have persistent negative thoughts about themselves, other people, and the world. People with severe major depressive disorder may experience distorted beliefs or perceptions about the world (psychotic delusions). They also may see or hear things that are not real (psychotic hallucinations). °DIAGNOSIS °Major depressive disorder is diagnosed through an assessment by your health care provider. Your health care provider will ask about aspects of your daily life,  such as mood, sleep, and appetite, to see if you have the diagnostic symptoms of major depressive disorder. Your health care provider may ask about your medical history and use of alcohol or drugs, including prescription medicines. Your health care provider also may do a physical exam and blood work. This is because certain medical conditions and the use of certain substances can cause major depressive disorder-like symptoms (secondary depression). Your health care provider also may refer you to a mental health specialist for further evaluation and treatment. °TREATMENT °It is important to recognize the symptoms of major depressive disorder and seek treatment. The following treatments can be prescribed for this disorder:   °· Medicine. Antidepressant medicines usually are prescribed. Antidepressant medicines are thought to correct chemical imbalances in the brain that are commonly associated with major depressive disorder. Other types of medicine may be added if the symptoms do not respond to antidepressant medicines alone or if psychotic delusions or hallucinations occur. °· Talk therapy. Talk therapy can be helpful in treating major depressive disorder by providing support, education, and guidance. Certain types of talk therapy also can help with negative thinking (cognitive behavioral therapy) and with relationship issues that trigger this disorder (interpersonal therapy). °A mental health specialist can help determine which treatment is best for you. Most people with major depressive disorder do well with a combination of medicine and talk therapy. Treatments involving electrical stimulation of the brain can be used in situations with extremely severe symptoms or when medicine and talk therapy do not work over time. These treatments include electroconvulsive therapy, transcranial magnetic stimulation, and vagal nerve stimulation. °  °This information is not intended to replace advice given to you by your health  care provider. Make sure you discuss any questions you have   with your health care provider. °  °Document Released: 07/06/2012 Document Revised: 04/01/2014 Document Reviewed: 07/06/2012 °Elsevier Interactive Patient Education ©2016 Elsevier Inc. ° °

## 2015-10-07 NOTE — BH Assessment (Signed)
Writer provided the patient with outpatient resources.

## 2015-10-07 NOTE — ED Provider Notes (Addendum)
[facility] Emergency Department Provider Note  ____________________________________________   I have reviewed the triage vital signs and the nursing notes.   HISTORY  Chief Complaint Chest Pain    HPI Kylie [name] is a 25 y.o. female states that she is anxious and depressed. She states that she has been having panic attacks. She denies chest pain or shortness of breath at this time. She states over the last several months she will have crying fits out of the blue. She has difficulty sleeping because she feels anxious. She works double shift to support her 2 children. 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 She has no thoughts of her near self her children or anyone else. She is not being abused. She states she has never taken an overdose or tried to kill herself. She is not hearing voices. She is here because these panic attacks and her tearfulness and depression are getting to be too much for her to cope with on her own although she has no thoughts of hurting herself she would like help. Patient has no personal or family history of PE or DVT, she has no birth control, she is not pregnant, she has no leg swelling, no recent travel or recent surgery.      Past Medical History  Diagnosis Date  . Irritable bowel syndrome   . Asthma     There are no active problems to display for this patient.   History reviewed. No pertinent past surgical history.  Current Outpatient Rx  Name  Route  Sig  Dispense  Refill  . ketorolac (TORADOL) 10 MG tablet   Oral   Take 1 tablet (10 mg total) by mouth every 8 (eight) hours.   15 tablet   0   . oxyCODONE-acetaminophen (PERCOCET/ROXICET) 5-325 MG tablet   Oral   Take 1 tablet by mouth every 4 (four) hours as needed for severe pain.   20 tablet   0     Allergies Review of patient's allergies indicates no known allergies.  No family history on file.  Social History Social History  Substance Use Topics  . Smoking status:  Current Every Day Smoker -- 0.50 packs/day    Types: Cigarettes  . Smokeless tobacco: None  . Alcohol Use: No    Review of Systems Constitutional: No fever/chills Eyes: No visual changes. ENT: No sore throat. No stiff neck no neck pain Cardiovascular: Denies chest pain. Respiratory: See history of present illness Gastrointestinal:   no vomiting.  No diarrhea.  No constipation. Genitourinary: Negative for dysuria. Musculoskeletal: Negative lower extremity swelling Skin: Negative for rash. Neurological: Negative for headaches, focal weakness or numbness. 10-point ROS otherwise negative.  ____________________________________________   PHYSICAL EXAM:  VITAL SIGNS: ED Triage Vitals  Enc Vitals Group     BP 10/07/15 1604 119/63 mmHg     Pulse Rate 10/07/15 1604 82     Resp 10/07/15 1604 16     Temp 10/07/15 1604 98.3 F (36.8 C)     Temp Source 10/07/15 1604 Oral     SpO2 10/07/15 1604 100 %     Weight 10/07/15 1604 140 lb (63.504 kg)     Height 10/07/15 1604  (1.778 m)     Head Cir --      Peak Flow --      Pain Score 10/07/15 1604 5     Pain Loc --      Pain Edu? --      Excl. in GC? --  Constitutional: Alert and oriented. Well appearing and in no acute distress.Anxious and tearful but not clinically toxic. Eyes: Conjunctivae are normal. PERRL. EOMI. Head: Atraumatic. Nose: No congestion/rhinnorhea. Mouth/Throat: Mucous membranes are moist.  Oropharynx non-erythematous. Neck: No stridor.   Nontender with no meningismus Cardiovascular: Normal rate, regular rhythm. Grossly normal heart sounds.  Good peripheral circulation. Respiratory: Normal respiratory effort.  No retractions. Lungs CTAB. Abdominal: Soft and nontender. No distention. No guarding no rebound Back:  There is no focal tenderness or step off there is no midline tenderness there are no lesions noted. there is no CVA tenderness Musculoskeletal: No lower extremity tenderness. No joint effusions, no  DVT signs strong distal pulses no edema Neurologic:  Normal speech and language. No gross focal neurologic deficits are appreciated.  Skin:  Skin is warm, dry and intact. No rash noted. Psychiatric: Mood and affect are Anxious and tearful. Speech and behavior are normal.  ____________________________________________   LABS (all labs ordered are listed, but only abnormal results are displayed)  Labs Reviewed  URINALYSIS COMPLETEWITH MICROSCOPIC ([facility] ONLY) - Abnormal; Notable for the following:    Color, Urine STRAW (*)    APPearance CLEAR (*)    Bacteria, UA RARE (*)    Squamous Epithelial / LPF 0-5 (*)    All other components within normal limits  BASIC METABOLIC PANEL  CBC  TROPONIN I  POC URINE PREG, ED  POCT PREGNANCY, URINE   ____________________________________________  EKG  I personally interpreted any EKGs ordered by me or triage Normal sinus rhythm rate 70 bpm no acute ST elevation or depression, normal axis unremarkable EKG ____________________________________________  RADIOLOGY  I reviewed any imaging ordered by me or triage that were performed during my shift and, if possible, patient and/or family made aware of any abnormal findings. ____________________________________________   PROCEDURES  Procedure(s) performed: None  Critical Care performed: None  ____________________________________________   INITIAL IMPRESSION / ASSESSMENT AND PLAN / ED COURSE  Pertinent labs & imaging results that were available during my care of the patient were reviewed by me and considered in my medical decision making (see chart for details).  Patient is perk negative, she is here because of anxiety and depression. No thoughts of hurting herself or others. We will have psychiatric counselor discuss with her outpatient therapy options or perhaps she would like to stay for a voluntary discussion with the psychiatrist if that can be arranged. But she is not meeting criteria for  involuntary commitment at this time At this time, there does not appear to be clinical evidence to support the diagnosis of pulmonary embolus, dissection, myocarditis, endocarditis, pericarditis, pericardial tamponade, acute coronary syndrome, pneumothorax, pneumonia, or any other acute intrathoracic pathology that will require admission or acute intervention. Nor is there evidence of any significant intra-abdominal pathology causing this discomfort.  ----------------------------------------- 11:16 PM on 10/07/2015 -----------------------------------------  Patient is feeling much better. Still with no SI or HI, TTS consult and feel she is safe for discharge as do I return precautions and follow-up given and understood. ____________________________________________   FINAL CLINICAL IMPRESSION(S) / ED DIAGNOSES  Final diagnoses:  None      This chart was dictated using voice recognition software.  Despite best efforts to proofread,  errors can occur which can change meaning.     Jeanmarie PlantJames A Saachi Zale, MD 10/07/15 2117  Jeanmarie PlantJames A Ourania Hamler, MD 10/07/15 [id number]

## 2015-10-07 NOTE — BH Assessment (Addendum)
Tele Assessment Note   Kylie [name] is an 25 y.o. single female who presents unaccompanied to [facility] ED due to feeling anxiety and an uncomfortable feeling in her chest. Pt describes symptoms of a panic attack. She says she had panic attacks "once in a blue moon" but recently she is experiencing them almost daily. Pt reports symptoms including crying spells, social withdrawal, loss of interest in usual pleasures, fatigue, irritability decreased sleep and decreased appetite. Pt denies feelings of hopelessness. Pt denies current suicidal ideation or any history of suicide attempts. Pt denies intentional self-injurious behavior. Protective factors against suicide include good family support, children in the home, future orientation, no access to firearms, religious convictions and no prior attempts. Pt denies homicidal ideation or history of violence. Pt denies auditory or visual hallucinations. Pt states she quit using marijuana one month ago and denies current alcohol or substance use.  Pt states that her life is stressful. She says she is the single mother of two children, ages six and two. She has a eighth grade education and works long hours at [employer] for [employer]. She lives with her mother and two children and doesn't identify anyone other than her mother as supportive. Pt reports a paternal family history of substance abuse problems and a maternal family history of bipolar disorder and anxiety. Pt denies any history of inpatient or outpatient mental health or substance abuse treatment.   Pt is dressed in hospital scrubs, alert, oriented x4 with normal speech and normal motor behavior. Eye contact is good. Pt's mood is anxious and affect is congruent with mood. Thought process is coherent and relevant. There is no indication Pt is currently responding to internal stimuli or experiencing delusional thought content. Pt denies any current safety concerns. Pt states she would like help  with her anxiety and is receptive to outpatient therapy and medication management.   Diagnosis: Panic Disorder  Past Medical History:  Past Medical History  Diagnosis Date  . Irritable bowel syndrome   . Asthma     History reviewed. No pertinent past surgical history.  Family History: No family history on file.  Social History:  reports that she has been smoking Cigarettes.  She has been smoking about 0.50 packs per day. She does not have any smokeless tobacco history on file. She reports that she does not drink alcohol. Her drug history is not on file.  Additional Social History:  Alcohol / Drug Use Pain Medications: Denies use Prescriptions: See MAR Over the Counter: See MAR History of alcohol / drug use?: Yes (Pt reports she quit using marijuana one month ago) Longest period of sobriety (when/how long): One month  CIWA: CIWA-Ar BP: 135/80 mmHg Pulse Rate: 68 COWS:    PATIENT STRENGTHS: (choose at least two) Ability for insight Average or above average intelligence Capable of independent living Communication skills Financial means General fund of knowledge Motivation for treatment/growth Physical Health Supportive family/friends  Allergies: No Known Allergies  Home Medications:  (Not in a hospital admission)  OB/GYN Status:  Patient's last menstrual period was 09/20/2015 (approximate).  General Assessment Data Location of Assessment[facility] ED TTS Assessment: In system Is this a Tele or Face-to-Face Assessment?: Tele Assessment Is this an Initial Assessment or a Re-assessment for this encounter?: Initial Assessment Marital status: Single Maiden name: NA Is patient pregnant?: No Pregnancy Status: No Living Arrangements: Parent, Children (Mother, two children (6 and 2)) Can pt return to current living arrangement?: Yes Admission Status: Voluntary Is patient  capable of signing voluntary admission?: Yes Referral Source: Self/Family/Friend Insurance type:  Medicaid     Crisis Care Plan Living Arrangements: Parent, Children (Mother, two children (6 and 2)) Legal Guardian: Other: (Self) Name of Psychiatrist: None Name of Therapist: None  Education Status Is patient currently in school?: No Current Grade: NA Highest grade of school patient has completed: 8 Name of school: NA Contact person: NA  Risk to self with the past 6 months Suicidal Ideation: No Has patient been a risk to self within the past 6 months prior to admission? : No Suicidal Intent: No Has patient had any suicidal intent within the past 6 months prior to admission? : No Is patient at risk for suicide?: No Suicidal Plan?: No Has patient had any suicidal plan within the past 6 months prior to admission? : No Access to Means: No What has been your use of drugs/alcohol within the last 12 months?: Pt reports quitting marijuana one month ago Previous Attempts/Gestures: No How many times?: 0 Other Self Harm Risks: None Triggers for Past Attempts: None known Intentional Self Injurious Behavior: None Family Suicide History: No Recent stressful life event(s): Financial Problems Persecutory voices/beliefs?: No Depression: Yes Depression Symptoms: Despondent, Tearfulness, Isolating, Loss of interest in usual pleasures, Feeling angry/irritable Substance abuse history and/or treatment for substance abuse?: Yes Suicide prevention information given to non-admitted patients: Yes  Risk to Others within the past 6 months Homicidal Ideation: No Does patient have any lifetime risk of violence toward others beyond the six months prior to admission? : No Thoughts of Harm to Others: No Current Homicidal Intent: No Current Homicidal Plan: No Access to Homicidal Means: No Identified Victim: None History of harm to others?: No Assessment of Violence: None Noted Violent Behavior Description: None Does patient have access to weapons?: No Criminal Charges Pending?: No Does patient  have a court date: No Is patient on probation?: No  Psychosis Hallucinations: None noted Delusions: None noted  Mental Status Report Appearance/Hygiene: In scrubs Eye Contact: Good Motor Activity: Unremarkable Speech: Logical/coherent Level of Consciousness: Alert Mood: Anxious Affect: Anxious Anxiety Level: Panic Attacks Panic attack frequency: Daily Most recent panic attack: Today Thought Processes: Coherent, Relevant Judgement: Unimpaired Orientation: Person, Place, Time, Situation, Appropriate for developmental age Obsessive Compulsive Thoughts/Behaviors: None  Cognitive Functioning Concentration: Normal Memory: Recent Intact, Remote Intact IQ: Average Insight: Good Impulse Control: Good Appetite: Poor Weight Loss: 5 Weight Gain: 0 Sleep: Decreased Total Hours of Sleep: 5 Vegetative Symptoms: None  ADLScreening [facility] Assessment Services) Patient's cognitive ability adequate to safely complete daily activities?: Yes Patient able to express need for assistance with ADLs?: Yes Independently performs ADLs?: Yes (appropriate for developmental age)  Prior Inpatient Therapy Prior Inpatient Therapy: No Prior Therapy Dates: NA Prior Therapy Facilty/Provider(s): NA Reason for Treatment: NA  Prior Outpatient Therapy Prior Outpatient Therapy: No Prior Therapy Dates: NA Prior Therapy Facilty/Provider(s): NA Reason for Treatment: NA Does patient have an ACCT team?: No Does patient have Intensive In-House Services?  : No Does patient have [facility] services? : No Does patient have P4CC services?: No  ADL Screening (condition at time of admission) Patient's cognitive ability adequate to safely complete daily activities?: Yes Is the patient deaf or have difficulty hearing?: No Does the patient have difficulty seeing, even when wearing glasses/contacts?: No Does the patient have difficulty concentrating, remembering, or making decisions?: No Patient able to express need  for assistance with ADLs?: Yes Does the patient have difficulty dressing or bathing?: No Independently performs ADLs?: Yes (appropriate  for developmental age) Does the patient have difficulty walking or climbing stairs?: No Weakness of Legs: None Weakness of Arms/Hands: None  Home Assistive Devices/Equipment Home Assistive Devices/Equipment: None    Abuse/Neglect Assessment (Assessment to be complete while patient is alone) Physical Abuse: Denies Verbal Abuse: Denies Sexual Abuse: Denies Exploitation of patient/patient's resources: Denies Self-Neglect: Denies     Merchant navy officer (For Healthcare) Does patient have an advance directive?: No Would patient like information on creating an advanced directive?: No - patient declined information    Additional Information 1:1 In Past 12 Months?: No CIRT Risk: No Elopement Risk: No Does patient have medical clearance?: Yes     Disposition: Gave clinical report to Maryjean Morn, PA who said Pt does not meet criteria for inpatient psychiatric treatment and recommends Pt be given referrals to outpatient treatment. Ava Elisabeth Most with TTS will provide Pt with appropriate resources. Notified Jeanmarie Plant, MD and Mallie Mussel, RN of recommendation.  Disposition Initial Assessment Completed for this Encounter: Yes Disposition of Patient: Outpatient treatment Type of outpatient treatment: Adult   Pamalee Leyden, [facility], [facility], [facility] Triage Specialist [phone]   Pamalee Leyden 10/07/2015 10:16 PM

## 2015-10-07 NOTE — ED Notes (Signed)
Pt reports chest pain since this morning, "butterfly feeling" in the center of her chest. Pt denies n/v. Reports shortness of breath.

## 2016-01-26 ENCOUNTER — Encounter: Payer: Self-pay | Admitting: Emergency Medicine

## 2016-01-26 ENCOUNTER — Emergency Department
Admission: EM | Admit: 2016-01-26 | Discharge: 2016-01-26 | Disposition: A | Discharge status: Home / Self Care | Payer: Medicaid Other | Attending: Emergency Medicine | Admitting: Emergency Medicine

## 2016-01-26 DIAGNOSIS — K029 Dental caries, unspecified: Secondary | ICD-10-CM | POA: Insufficient documentation

## 2016-01-26 DIAGNOSIS — K0889 Other specified disorders of teeth and supporting structures: Secondary | ICD-10-CM

## 2016-01-26 DIAGNOSIS — F17210 Nicotine dependence, cigarettes, uncomplicated: Secondary | ICD-10-CM | POA: Diagnosis not present

## 2016-01-26 DIAGNOSIS — J45909 Unspecified asthma, uncomplicated: Secondary | ICD-10-CM | POA: Insufficient documentation

## 2016-01-26 DIAGNOSIS — Z79899 Other long term (current) drug therapy: Secondary | ICD-10-CM | POA: Insufficient documentation

## 2016-01-26 MED ORDER — AMOXICILLIN 500 MG PO CAPS: 500.0000 mg | ORAL_CAPSULE | Freq: Three times a day (TID) | ORAL | 0 refills | Status: DC

## 2016-01-26 MED ORDER — IBUPROFEN 600 MG PO TABS: 600.0000 mg | ORAL_TABLET | Freq: Three times a day (TID) | ORAL | 0 refills | Status: DC | PRN

## 2016-01-26 MED ORDER — LIDOCAINE VISCOUS 2 % MT SOLN: 5.0000 mL | Freq: Four times a day (QID) | OROMUCOSAL | 0 refills | Status: DC | PRN

## 2016-01-26 MED ORDER — TRAMADOL HCL 50 MG PO TABS: 50.0000 mg | ORAL_TABLET | Freq: Two times a day (BID) | ORAL | 0 refills | Status: DC | PRN

## 2016-01-26 MED ORDER — LIDOCAINE VISCOUS 2 % MT SOLN
15.0000 mL | Freq: Once | OROMUCOSAL | Status: AC
  Administered 2016-01-26: 15 mL via OROMUCOSAL
  Filled 2016-01-26: qty 15

## 2016-01-26 NOTE — ED Notes (Signed)
Pt to ed with c/o wisdom tooth erupting through gums x 3 days.  Pt states pain worse with cold temps.

## 2016-01-26 NOTE — ED Triage Notes (Signed)
Reports toothache on right lower side.  No resp distress

## 2016-01-26 NOTE — ED Provider Notes (Signed)
[facility] Emergency Department Provider Note   ____________________________________________   First MD Initiated Contact with Patient 01/26/16 [phone]     (approximate)  I have reviewed the triage vital signs and the nursing notes.   HISTORY  Chief Complaint Dental Pain    HPI Kylie [name] is a 25 y.o. female patient complaining of dental pain for 2 days. Patient has a deep vitalized tooth right upper molar. Patient states she is aware that the tooth needs to be extracted and will follow-up with dentist next week. Patient is rating her pain as a 10 over 10. Patient state no relief with over-the-counter anti-inflammatory medication and Orajel. Patient describes pain as "sharp".   Past Medical History:  Diagnosis Date  . Asthma   . Irritable bowel syndrome     There are no active problems to display for this patient.   History reviewed. No pertinent surgical history.  Prior to Admission medications   Medication Sig Start Date End Date Taking? Authorizing Provider  amoxicillin (AMOXIL) 500 MG capsule Take 1 capsule (500 mg total) by mouth 3 (three) times daily. 01/26/16   Joni Reiningonald K Brandey Vandalen, PA-C  ibuprofen (ADVIL,MOTRIN) 600 MG tablet Take 1 tablet (600 mg total) by mouth every 8 (eight) hours as needed. 01/26/16   Joni Reiningonald K Lissandra Keil, PA-C  ketorolac (TORADOL) 10 MG tablet Take 1 tablet (10 mg total) by mouth every 8 (eight) hours. 07/27/14   Jenise V Bacon Menshew, PA-C  lidocaine (XYLOCAINE) 2 % solution Use as directed 5 mLs in the mouth or throat every 6 (six) hours as needed for mouth pain. 01/26/16   Joni Reiningonald K Rilei Kravitz, PA-C  oxyCODONE-acetaminophen (PERCOCET/ROXICET) 5-325 MG tablet Take 1 tablet by mouth every 4 (four) hours as needed for severe pain. 08/31/15   Darci Currentandolph N Brown, MD  traMADol (ULTRAM) 50 MG tablet Take 1 tablet (50 mg total) by mouth every 12 (twelve) hours as needed. 01/26/16   Joni Reiningonald K Danaisha Celli, PA-C    Allergies Review of patient's allergies  indicates no known allergies.  No family history on file.  Social History Social History  Substance Use Topics  . Smoking status: Current Every Day Smoker    Packs/day: 0.50    Types: Cigarettes  . Smokeless tobacco: Never Used  . Alcohol use No    Review of Systems Constitutional: No fever/chills Eyes: No visual changes. ENT: No sore throat. Dental pain Cardiovascular: Denies chest pain. Respiratory: Denies shortness of breath. Gastrointestinal: No abdominal pain.  No nausea, no vomiting.  No diarrhea.  No constipation. Genitourinary: Negative for dysuria. Musculoskeletal: Negative for back pain. Skin: Negative for rash. Neurological: Negative for headaches, focal weakness or numbness.    ____________________________________________   PHYSICAL EXAM:  VITAL SIGNS: ED Triage Vitals  Enc Vitals Group     BP 01/26/16 0740 114/64     Pulse Rate 01/26/16 0740 82     Resp 01/26/16 0740 16     Temp 01/26/16 0740 97.5 F (36.4 C)     Temp Source 01/26/16 0740 Oral     SpO2 01/26/16 0740 99 %     Weight 01/26/16 0741 145 lb (65.8 kg)     Height 01/26/16 0741 5\' 10"  (1.778 m)     Head Circumference --      Peak Flow --      Pain Score 01/26/16 0736 10     Pain Loc --      Pain Edu? --      Excl.  in GC? --     Constitutional: Alert and oriented. Well appearing and in no acute distress. Eyes: Conjunctivae are normal. PERRL. EOMI. Head: Atraumatic. Nose: No congestion/rhinnorhea. Mouth/Throat: Mucous membranes are moist.  Oropharynx non-erythematous.Dental caries tooth #14. Mild gingival edema. Neck: No stridor.  No cervical spine tenderness to palpation. Hematological/Lymphatic/Immunilogical: No cervical lymphadenopathy. Cardiovascular: Normal rate, regular rhythm. Grossly normal heart sounds.  Good peripheral circulation. Respiratory: Normal respiratory effort.  No retractions. Lungs CTAB. Gastrointestinal: Soft and nontender. No distention. No abdominal bruits. No  CVA tenderness. Musculoskeletal: No lower extremity tenderness nor edema.  No joint effusions. Neurologic:  Normal speech and language. No gross focal neurologic deficits are appreciated. No gait instability. Skin:  Skin is warm, dry and intact. No rash noted. Psychiatric: Mood and affect are normal. Speech and behavior are normal.  ____________________________________________   LABS (all labs ordered are listed, but only abnormal results are displayed)  Labs Reviewed - No data to display ____________________________________________  EKG   ____________________________________________  RADIOLOGY   ____________________________________________   PROCEDURES  Procedure(s) performed: None  Procedures  Critical Care performed: No  ____________________________________________   INITIAL IMPRESSION / ASSESSMENT AND PLAN / ED COURSE  Pertinent labs & imaging results that were available during my care of the patient were reviewed by me and considered in my medical decision making (see chart for details).  Dental pain. Patient given discharge Instructions. Patient advised to follow-up with list of dental clinics provided. Patient given a prescription for amoxicillin, viscous lidocaine and tramadol. Clinical Course     ____________________________________________   FINAL CLINICAL IMPRESSION(S) / ED DIAGNOSES  Final diagnoses:  Pain, dental      NEW MEDICATIONS STARTED DURING THIS VISIT:  New Prescriptions   AMOXICILLIN (AMOXIL) 500 MG CAPSULE    Take 1 capsule (500 mg total) by mouth 3 (three) times daily.   IBUPROFEN (ADVIL,MOTRIN) 600 MG TABLET    Take 1 tablet (600 mg total) by mouth every 8 (eight) hours as needed.   LIDOCAINE (XYLOCAINE) 2 % SOLUTION    Use as directed 5 mLs in the mouth or throat every 6 (six) hours as needed for mouth pain.   TRAMADOL (ULTRAM) 50 MG TABLET    Take 1 tablet (50 mg total) by mouth every 12 (twelve) hours as needed.     Note:   This document was prepared using Dragon voice recognition software and may include unintentional dictation errors.    Joni Reiningonald K Aris Moman, PA-C 01/26/16 0750    Sharman CheekPhillip Stafford, MD 01/26/16 [phone]

## 2016-01-26 NOTE — Discharge Instructions (Signed)
May choose problem list for dental care. OPTIONS FOR DENTAL FOLLOW UP CARE  [place] Department of Health and Human Services - Local Safety Net Dental Clinics [email or web address]://www.ncdhhs.gov/dph/oralhealth/services/safetynetclinics.htm   [facility] [phone])  [facility] [phone])  [facility] [phone])  [facility] [phone])  [facility] [phone]) This clinic caters to the indigent population and is on a lottery system. Location: [employer], [address] [facility], [address], [place] [facility] Hours: Wednesdays from 6pm - 9pm, patients seen by a lottery system. For dates, call or go to [email or web address] Services: Cleanings, fillings and simple extractions. Payment Options: DENTAL WORK IS FREE OF CHARGE. Bring proof of income or support. Best way to get seen: Arrive at 5:15 pm - this is a lottery, NOT first come/first serve, so arriving earlier will not increase your chances of being seen.     [facility] [phone] Select option 1 for emergencies   Location: [facility], [facility], [address], [place] [facility] Hours: No walk-ins accepted - call the day before to schedule an appointment. Check in times are 9:30 am and 1:30 pm. Services: Simple extractions, temporary fillings, pulpectomy/pulp debridement, uncomplicated abscess drainage. Payment Options: PAYMENT IS DUE AT THE TIME OF SERVICE.  Fee is usually $100-200, additional surgical procedures (e.g. abscess drainage) may be extra. Cash, checks, Visa/MasterCard accepted.  Can file Medicaid if patient is covered for dental - patient should call case worker to check. No discount for [facility] patients. Best way to get seen: MUST call the day before and get onto the schedule. Can usually be seen the next 1-2 days. No walk-ins accepted.     [facility] [phone]   Location: [facility], [address], [place] [facility] Hours: M, W, Th, F 8am or 1:30pm, Tues 9a or 1:30 - first come/first served. Services: Simple extractions, temporary fillings, uncomplicated abscess drainage.  You do not need to be [place] resident. Payment Options: PAYMENT IS DUE AT THE TIME OF SERVICE. Dental insurance, otherwise sliding scale - bring proof of income or support. Depending on income and treatment needed, cost is usually $50-200. Best way to get seen: Arrive early as it is first come/first served.     [facility] [phone]   Location: [address] Clinic Hours: Mon-Thu 8a-5p Services: Most basic dental services including extractions and fillings. Payment Options: PAYMENT IS DUE AT THE TIME OF SERVICE. Sliding scale, up to 50% off - bring proof if income or support. Medicaid with dental option accepted. Best way to get seen: Call to schedule an appointment, can usually be seen within 2 weeks OR they will try to see walk-ins - show up at 8a or 2p (you may have to wait).     [facility] [phone] [place] RESIDENTS ONLY   Location: [facility], [address], [place], [place] [id number] Clinic Hours: By appointment only. Monday - Thursday 8am-5pm, Friday 8am-12pm Services: Cleanings, fillings, extractions. Payment Options: PAYMENT IS DUE AT THE TIME OF SERVICE. Cash, Visa or MasterCard. Sliding scale - $30 minimum per service. Best way to get seen: Come in to office, complete packet and make an appointment - need proof of income or support monies for each household member and proof of [place] [place] [place] [place] residence. Usually takes about a month to get in.     [facility]  Location: °[address] [place] °Clinic Hours: Walk-in Urgent Care Dental Services  are offered Monday-Friday mornings only. °The numbers of emergencies accepted daily is limited to the number of °providers available. °Maximum 15 - Mondays, Wednesdays & Thursdays °Maximum 10 - Tuesdays & Fridays °Services: °You do not need to be a [place] resident to be seen for a dental emergency. °Emergencies are defined as pain, swelling, abnormal bleeding, or dental trauma. Walkins will receive x-rays if needed. °NOTE: Dental cleaning is not an emergency. °Payment Options: °PAYMENT IS DUE AT THE TIME OF SERVICE. °Minimum co-pay is $40.00 for uninsured patients. °Minimum co-pay is $3.00 for Medicaid with dental coverage. °Dental Insurance is accepted and must be presented at time of visit. °Medicare does not cover dental. °Forms of payment: Cash, credit card, checks. °Best way to get seen: °If not previously registered with the clinic, walk-in dental registration begins at 7:15 am and is on a first come/first serve basis. °If previously registered with the clinic, call to make an appointment. °  °  °[facility] °[phone] °[place] RESIDENTS ONLY °  °Location: °[address], [place], [place] °Clinic Hours: °Mon-Thu 10a-2p °Services: Extractions only! °Payment Options: °FREE (donations accepted) - bring proof of income or support °Best way to get seen: °Call and schedule an appointment OR come at 8am on the 1st Monday of every month (except for holidays) when it is first come/first served. °  °  °[facility] °[phone] °  °Location: °[address], [place] °Clinic Hours: °Friday mornings °Services, Payment Options, Best way to get seen: °Call for info ° °

## 2016-04-23 ENCOUNTER — Encounter: Payer: Self-pay | Admitting: Emergency Medicine

## 2016-04-23 ENCOUNTER — Emergency Department: Payer: Medicaid Other

## 2016-04-23 ENCOUNTER — Emergency Department
Admission: EM | Admit: 2016-04-23 | Discharge: 2016-04-23 | Disposition: A | Discharge status: Home / Self Care | Payer: Medicaid Other | Attending: Emergency Medicine | Admitting: Emergency Medicine

## 2016-04-23 DIAGNOSIS — R112 Nausea with vomiting, unspecified: Secondary | ICD-10-CM

## 2016-04-23 DIAGNOSIS — J45909 Unspecified asthma, uncomplicated: Secondary | ICD-10-CM | POA: Diagnosis not present

## 2016-04-23 DIAGNOSIS — N83202 Unspecified ovarian cyst, left side: Secondary | ICD-10-CM | POA: Diagnosis not present

## 2016-04-23 DIAGNOSIS — N3001 Acute cystitis with hematuria: Secondary | ICD-10-CM | POA: Insufficient documentation

## 2016-04-23 DIAGNOSIS — R1084 Generalized abdominal pain: Secondary | ICD-10-CM | POA: Diagnosis present

## 2016-04-23 DIAGNOSIS — Z79899 Other long term (current) drug therapy: Secondary | ICD-10-CM | POA: Insufficient documentation

## 2016-04-23 DIAGNOSIS — A5901 Trichomonal vulvovaginitis: Secondary | ICD-10-CM | POA: Diagnosis not present

## 2016-04-23 DIAGNOSIS — R102 Pelvic and perineal pain: Secondary | ICD-10-CM

## 2016-04-23 DIAGNOSIS — Z87891 Personal history of nicotine dependence: Secondary | ICD-10-CM | POA: Diagnosis not present

## 2016-04-23 LAB — WET PREP, GENITAL
CLUE CELLS WET PREP: NONE SEEN
Sperm: NONE SEEN
Yeast Wet Prep HPF POC: NONE SEEN

## 2016-04-23 LAB — COMPREHENSIVE METABOLIC PANEL
ALT: 16 U/L (ref 14–54)
ANION GAP: 7 (ref 5–15)
AST: 18 U/L (ref 15–41)
Albumin: 4.2 g/dL (ref 3.5–5.0)
Alkaline Phosphatase: 81 U/L (ref 38–126)
BUN: 14 mg/dL (ref 6–20)
CHLORIDE: 106 mmol/L (ref 101–111)
CO2: 26 mmol/L (ref 22–32)
Calcium: 8.7 mg/dL — ABNORMAL LOW (ref 8.9–10.3)
Creatinine, Ser: 0.56 mg/dL (ref 0.44–1.00)
Glucose, Bld: 106 mg/dL — ABNORMAL HIGH (ref 65–99)
POTASSIUM: 3.7 mmol/L (ref 3.5–5.1)
SODIUM: 139 mmol/L (ref 135–145)
Total Bilirubin: 0.8 mg/dL (ref 0.3–1.2)
Total Protein: 7.9 g/dL (ref 6.5–8.1)

## 2016-04-23 LAB — CHLAMYDIA/NGC RT PCR (ARMC ONLY)
CHLAMYDIA TR: NOT DETECTED
N gonorrhoeae: NOT DETECTED

## 2016-04-23 LAB — URINALYSIS, COMPLETE (UACMP) WITH MICROSCOPIC
BILIRUBIN URINE: NEGATIVE
Glucose, UA: NEGATIVE mg/dL
KETONES UR: 20 mg/dL — AB
Nitrite: NEGATIVE
PH: 5 (ref 5.0–8.0)
PROTEIN: 30 mg/dL — AB
Specific Gravity, Urine: 1.023 (ref 1.005–1.030)

## 2016-04-23 LAB — CBC
HEMATOCRIT: 40.6 % (ref 35.0–47.0)
HEMOGLOBIN: 13.5 g/dL (ref 12.0–16.0)
MCH: 29.6 pg (ref 26.0–34.0)
MCHC: 33.4 g/dL (ref 32.0–36.0)
MCV: 88.8 fL (ref 80.0–100.0)
PLATELETS: 160 10*3/uL (ref 150–440)
RBC: 4.57 MIL/uL (ref 3.80–5.20)
RDW: 13.2 % (ref 11.5–14.5)
WBC: 11.6 10*3/uL — AB (ref 3.6–11.0)

## 2016-04-23 LAB — PREGNANCY, URINE: PREG TEST UR: NEGATIVE

## 2016-04-23 LAB — LIPASE, BLOOD: LIPASE: 22 U/L (ref 11–51)

## 2016-04-23 MED ORDER — ONDANSETRON HCL 4 MG/2ML IJ SOLN
4.0000 mg | Freq: Once | INTRAMUSCULAR | Status: AC
  Administered 2016-04-23: 4 mg via INTRAVENOUS

## 2016-04-23 MED ORDER — HYDROMORPHONE HCL 1 MG/ML IJ SOLN
0.5000 mg | Freq: Once | INTRAMUSCULAR | Status: AC
Start: 2016-04-23 — End: 2016-04-23
  Administered 2016-04-23: 0.5 mg via INTRAVENOUS

## 2016-04-23 MED ORDER — CIPROFLOXACIN HCL 500 MG PO TABS: 500.0000 mg | ORAL_TABLET | Freq: Two times a day (BID) | ORAL | 0 refills | Status: AC

## 2016-04-23 MED ORDER — METRONIDAZOLE 500 MG PO TABS
2000.0000 mg | ORAL_TABLET | Freq: Once | ORAL | Status: AC
  Administered 2016-04-23: 2000 mg via ORAL
  Filled 2016-04-23: qty 4

## 2016-04-23 MED ORDER — PROMETHAZINE HCL 25 MG RE SUPP: 25.0000 mg | Freq: Four times a day (QID) | RECTAL | 0 refills | Status: DC | PRN

## 2016-04-23 MED ORDER — DOXYCYCLINE HYCLATE 100 MG PO TABS
100.0000 mg | ORAL_TABLET | Freq: Once | ORAL | Status: AC
  Administered 2016-04-23: 100 mg via ORAL
  Filled 2016-04-23: qty 1

## 2016-04-23 MED ORDER — METOCLOPRAMIDE HCL 5 MG/ML IJ SOLN
10.0000 mg | Freq: Once | INTRAMUSCULAR | Status: AC
  Administered 2016-04-23: 10 mg via INTRAVENOUS

## 2016-04-23 MED ORDER — METOCLOPRAMIDE HCL 5 MG/ML IJ SOLN
INTRAMUSCULAR | Status: AC
  Administered 2016-04-23: 10 mg via INTRAVENOUS
  Filled 2016-04-23: qty 2

## 2016-04-23 MED ORDER — CEFTRIAXONE SODIUM 250 MG IJ SOLR
250.0000 mg | Freq: Once | INTRAMUSCULAR | Status: AC
  Administered 2016-04-23: 250 mg via INTRAMUSCULAR
  Filled 2016-04-23: qty 250

## 2016-04-23 MED ORDER — ONDANSETRON HCL 4 MG/2ML IJ SOLN
4.0000 mg | Freq: Once | INTRAMUSCULAR | Status: AC | PRN
  Administered 2016-04-23: 4 mg via INTRAVENOUS
  Filled 2016-04-23: qty 2

## 2016-04-23 MED ORDER — HYDROMORPHONE HCL 1 MG/ML IJ SOLN
INTRAMUSCULAR | Status: AC
  Administered 2016-04-23: 0.5 mg via INTRAVENOUS
  Filled 2016-04-23: qty 1

## 2016-04-23 MED ORDER — CIPROFLOXACIN HCL 500 MG PO TABS
500.0000 mg | ORAL_TABLET | Freq: Once | ORAL | Status: AC
  Administered 2016-04-23: 500 mg via ORAL
  Filled 2016-04-23: qty 1

## 2016-04-23 MED ORDER — ONDANSETRON HCL 4 MG/2ML IJ SOLN
INTRAMUSCULAR | Status: AC
  Administered 2016-04-23: 4 mg via INTRAVENOUS
  Filled 2016-04-23: qty 2

## 2016-04-23 MED ORDER — METRONIDAZOLE 500 MG PO TABS: 500.0000 mg | ORAL_TABLET | Freq: Two times a day (BID) | ORAL | 0 refills | Status: DC

## 2016-04-23 MED ORDER — METRONIDAZOLE 500 MG PO TABS
ORAL_TABLET | ORAL | Status: DC
Start: 2016-04-23 — End: 2016-04-23
  Filled 2016-04-23: qty 3

## 2016-04-23 MED ORDER — SODIUM CHLORIDE 0.9 % IV BOLUS (SEPSIS)
1000.0000 mL | Freq: Once | INTRAVENOUS | Status: AC
  Administered 2016-04-23: 1000 mL via INTRAVENOUS

## 2016-04-23 MED ORDER — PROMETHAZINE HCL 25 MG PO TABS: 25.0000 mg | ORAL_TABLET | Freq: Three times a day (TID) | ORAL | 0 refills | Status: DC | PRN

## 2016-04-23 MED ORDER — PROMETHAZINE HCL 25 MG/ML IJ SOLN
25.0000 mg | Freq: Once | INTRAMUSCULAR | Status: AC
  Administered 2016-04-23: 25 mg via INTRAVENOUS
  Filled 2016-04-23: qty 1

## 2016-04-23 MED ORDER — PROMETHAZINE HCL 25 MG/ML IJ SOLN
INTRAMUSCULAR | Status: AC
  Administered 2016-04-23: 25 mg via INTRAVENOUS
  Filled 2016-04-23: qty 1

## 2016-04-23 NOTE — ED Triage Notes (Signed)
Patient presents to the ED with lower abdominal pain, nausea, and vomiting that began today.  Patient reports vomiting x 7 today.  [facility] and was given 4mg  of IV zofran en route.  Patient appears uncomfortable during triage.  Patient appears slightly pale.  Denies diarrhea.  Denies vaginal bleeding.  Denies fever.  Patient is alert and oriented x 4 and is speaking in full sentences.

## 2016-04-23 NOTE — ED Notes (Signed)
Pt vomited green emesis on the floor after phenergan given.. EDP notified..Marland Kitchen

## 2016-04-23 NOTE — ED Provider Notes (Addendum)
[facility] Emergency Department Provider Note  ____________________________________________  Time seen: Approximately 2:16 PM  I have reviewed the triage vital signs and the nursing notes.   HISTORY  Chief Complaint Emesis and Abdominal Pain    HPI Kylie [name] is a 26 y.o. female w/ a hx of IBS and endometriosis presenting with 3 days of nausea, now with vomiting and lower abdominal pain. The patient reports that for the past 3 days she has been nauseated, just completed her menses. This morning, she began to have multiple episodes of vomiting and after the vomiting started, she developed diffuse lower abdominal pain, and epigastric pain. Since being here, she has developed a severe pain in her vagina, described as "someone taking a knife and pushing it out of me." She denies any associated fever, change in vaginal discharge, dysuria. Last bowel movement was this morning and it was normal. No fevers or chills. The patient reports that in the past, she has had pain "way worse than this," with endometriosis; she has not followed by a gynecologist for this.   Past Medical History:  Diagnosis Date  . Asthma   . Irritable bowel syndrome     There are no active problems to display for this patient.   History reviewed. No pertinent surgical history.  Current Outpatient Rx  . Order #: [id number] Class: Print  . Order #: [id number] Class: Print  . Order #: [id number] Class: Print  . Order #: [id number] Class: Print  . Order #: [id number] Class: Print  . Order #: [id number] Class: Print  . Order #: [id number] Class: Print  . Order #: [id number] Class: Print  . Order #: [id number] Class: Print  . Order #: [id number] Class: Print    Allergies Patient has no known allergies.  No family history on file.  Social History Social History  Substance Use Topics  . Smoking status: Former Smoker    Packs/day: 0.50    Types: Cigarettes    Quit date: 04/09/2016  . Smokeless  tobacco: Never Used  . Alcohol use No    Review of Systems Constitutional: No fever/chills.No lightheadedness or syncope. Eyes: No visual changes. ENT: No sore throat. No congestion or rhinorrhea. Cardiovascular: Denies chest pain. Denies palpitations. Respiratory: Denies shortness of breath.  No cough. Gastrointestinal: Positive lower abdominal pain.  Positive nausea, positive vomiting.  No diarrhea.  No constipation. Genitourinary: Negative for dysuria. Positive for vaginal pain. No change in vaginal discharge. Musculoskeletal: Negative for back pain. Skin: Negative for rash. Neurological: Negative for headaches. No focal numbness, tingling or weakness.   10-point ROS otherwise negative.  ____________________________________________   PHYSICAL EXAM:  VITAL SIGNS: ED Triage Vitals  Enc Vitals Group     BP 04/23/16 1239 114/88     Pulse Rate 04/23/16 1239 (!) 102     Resp 04/23/16 1239 18     Temp 04/23/16 1239 98.3 F (36.8 C)     Temp Source 04/23/16 1239 Oral     SpO2 04/23/16 1239 100 %     Weight 04/23/16 1239 140 lb (63.5 kg)     Height 04/23/16 1239 5\' 9"  (1.753 m)     Head Circumference --      Peak Flow --      Pain Score 04/23/16 1240 8     Pain Loc --      Pain Edu? --      Excl. in GC? --     Constitutional: Alert and oriented. Uncomfortable appearing but nontoxic. Answers questions appropriately. Eyes: Conjunctivae are normal.  EOMI. No scleral icterus. Head: Atraumatic. Nose: No congestion/rhinnorhea. Mouth/Throat: Mucous membranes are dry.  Neck: No stridor.  Supple.  No JVD. No meningismus. Cardiovascular: Normal rate, regular rhythm. No murmurs, rubs or gallops.  Respiratory: Normal respiratory effort.  No accessory muscle use or retractions. Lungs CTAB.  No wheezes, rales or ronchi. Gastrointestinal: Soft,and nondistended.  Diffuse tenderness in the lower abdomen without focality. Positive mild epigastric pain. Negative Murphy sign. No guarding or  rebound.  No peritoneal signs. Genitourinary: Normal-appearing external genitalia without lesions. Vaginal exam with moderate yellow thin discharge, normal-appearing cervix, normal vaginal wall tissue. Bimanual exam is Positive for CMT, bilateral but L>R adnexal tenderness to palpation, no palpable masses. Musculoskeletal: No LE edema.  Neurologic:  A&Ox3.  Speech is clear.  Face and smile are symmetric.  EOMI.  Moves all extremities well. Skin:  Skin is warm, dry and intact. No rash noted. Psychiatric: Mood and affect are normal. Speech and behavior are normal.  Normal judgement  ____________________________________________   LABS (all labs ordered are listed, but only abnormal results are displayed)  Labs Reviewed  WET PREP, GENITAL - Abnormal; Notable for the following:       Result Value   Trich, Wet Prep PRESENT (*)    WBC, Wet Prep HPF POC MANY (*)    All other components within normal limits  COMPREHENSIVE METABOLIC PANEL - Abnormal; Notable for the following:    Glucose, Bld 106 (*)    Calcium 8.7 (*)    All other components within normal limits  CBC - Abnormal; Notable for the following:    WBC 11.6 (*)    All other components within normal limits  URINALYSIS, COMPLETE (UACMP) WITH MICROSCOPIC - Abnormal; Notable for the following:    Color, Urine YELLOW (*)    APPearance CLOUDY (*)    Hgb urine dipstick LARGE (*)    Ketones, ur 20 (*)    Protein, ur 30 (*)    Leukocytes, UA MODERATE (*)    Bacteria, UA RARE (*)    Squamous Epithelial / LPF 6-30 (*)    All other components within normal limits  CHLAMYDIA/NGC RT PCR ([facility] ONLY)  LIPASE, BLOOD  PREGNANCY, URINE   ____________________________________________  EKG  Not indicated ____________________________________________  RADIOLOGY  US Transvaginal Non-ob  Result Date: 04/23/2016 CLINICAL DATA:  One day of pelvic pain greatest on the left. Onset of last normal menstrual period was April 15, 2016.  EXAM: TRANSABDOMINAL AND TRANSVAGINAL ULTRASOUND OF PELVIS TECHNIQUE: Both transabdominal and transvaginal ultrasound examinations of the pelvis were performed. Transabdominal technique was performed for global imaging of the pelvis including uterus, ovaries, adnexal regions, and pelvic cul-de-sac. It was necessary to proceed with endovaginal exam following the transabdominal exam to visualize the endometrium. COMPARISON:  Pelvic ultrasound of August 31, 2015. FINDINGS: Uterus Measurements: 7.3 x 3.9 x 6.2 cm. No fibroids or other mass visualized. Endometrium Thickness: 5.1 mm. There is no endometrial mass or fluid collection. Right ovary Measurements: 3.7 x 2.3 x 2.5 cm. Normal appearance/no adnexal mass. There are multiple developing follicles. No cyst is observed. Left ovary Measurements: 4.9 x 2.8 x 3.0 cm. There is a complex appearing hypoechoic focus with internal echoes measuring 2.4 x 1.8 x 2.3 cm. Other findings There is no free pelvic fluid. IMPRESSION: Probable hemorrhagic cyst in the left ovary measuring up to 2.4 cm. Follow-up ultrasound in 8-12 weeks is recommended to reassess this structure. Normal-appearing right ovary with no evidence of a residual cyst. No free pelvic  fluid. Normal appearance of the uterus and endometrium. Electronically Signed   By: David  SwazilandJordan M.D.   On: 04/23/2016 17:04   [place] Pelvis Complete  Result Date: 04/23/2016 CLINICAL DATA:  One day of pelvic pain greatest on the left. Onset of last normal menstrual period was April 15, 2016. EXAM: TRANSABDOMINAL AND TRANSVAGINAL ULTRASOUND OF PELVIS TECHNIQUE: Both transabdominal and transvaginal ultrasound examinations of the pelvis were performed. Transabdominal technique was performed for global imaging of the pelvis including uterus, ovaries, adnexal regions, and pelvic cul-de-sac. It was necessary to proceed with endovaginal exam following the transabdominal exam to visualize the endometrium. COMPARISON:  Pelvic ultrasound of  August 31, 2015. FINDINGS: Uterus Measurements: 7.3 x 3.9 x 6.2 cm. No fibroids or other mass visualized. Endometrium Thickness: 5.1 mm. There is no endometrial mass or fluid collection. Right ovary Measurements: 3.7 x 2.3 x 2.5 cm. Normal appearance/no adnexal mass. There are multiple developing follicles. No cyst is observed. Left ovary Measurements: 4.9 x 2.8 x 3.0 cm. There is a complex appearing hypoechoic focus with internal echoes measuring 2.4 x 1.8 x 2.3 cm. Other findings There is no free pelvic fluid. IMPRESSION: Probable hemorrhagic cyst in the left ovary measuring up to 2.4 cm. Follow-up ultrasound in 8-12 weeks is recommended to reassess this structure. Normal-appearing right ovary with no evidence of a residual cyst. No free pelvic fluid. Normal appearance of the uterus and endometrium. Electronically Signed   By: David  [place]Jordan M.D.   On: 04/23/2016 17:04    ____________________________________________   PROCEDURES  Procedure(s) performed: None  Procedures  Critical Care performed: No ____________________________________________   INITIAL IMPRESSION / ASSESSMENT AND PLAN / ED COURSE  Pertinent labs & imaging results that were available during my care of the patient were reviewed by me and considered in my medical decision making (see chart for details).  26 y.o. female with a history of IBS and endometriosis presenting with lower abdominal pain, epigastric pain, nausea and vomiting, without fever or constipation. The patient does appearing comfortable and has mild elevation in her white blood cell count 11.6. We'll do a pelvic examination to evaluate for possible pelvic pathology including vaginal infection, also considering ovarian cyst, ovarian cyst rupture, and ovarian torsion. She does have some epigastric discomfort which may be from the multiple episodes of vomiting, or she may have a viral or foodborne GI illness although I would expect diarrhea with this. Depending on the  results of my pelvic examination, we will proceed with imaging, either pelvic ultrasound versus CT abdomen. The patient will receive symptomatic treatment immediately.  ----------------------------------------- 2:45 PM on 04/23/2016 -----------------------------------------  Given the abnormal discharge and discomfort that the patient experiences with pelvic examination, we'll proceed with pelvic ultrasound. I am concerned about a vaginal infection, including cervicitis or PID, given my physical exam findings. I will treat her empirically with ceftriaxone and doxycycline, and await the results of her wet prep as well. Counseled the patient about HIV testing with her primary care physician or at the public health department.  ----------------------------------------- 4:19 PM on 04/23/2016 -----------------------------------------  The patient states that she is feeling significantly better. Both her pain and nausea have resolved at this time. She did test positive for trichomoniasis, and has been given a single dose of Flagyl for treatment, which she has tolerated well. Her urinalysis is concerning for UTI, that given her first dose of ciprofloxacin here, and she'll go home with a prescription for 5 days of antibiotics. I'm awaiting her pelvic ultrasound for  final disposition.  ----------------------------------------- 5:16 PM on 04/23/2016 -----------------------------------------  The patient has vomited again despite 2 doses of Zofran and a dose of Phenergan. I'll try a dose of Reglan and reevaluate the patient. She will be discharged home with 7 days of Flagyl, as I'm concerned she vomited the 2 g she was given for single-dose treatment.  ----------------------------------------- 5:59 PM on 04/23/2016 -----------------------------------------  The patient is feeling better at this time. She has been given another by mouth challenge, and a dose of ciprofloxacin for her UTI. If she is able  to tolerate these medications, I'll plan to discharge her home. ____________________________________________  FINAL CLINICAL IMPRESSION(S) / ED DIAGNOSES  Final diagnoses:  Trichomoniasis of vagina  Acute cystitis with hematuria  Left ovarian cyst    Clinical Course as of Apr 23 1756  Tue Apr 23, 2016  1735 The patient is still feeling nauseated at this time; just received Reglan.  Pain is well controlled.  U/S shows L ovarian cyst.  [AN]    Clinical Course User Index [AN] Rockne Menghini, MD      NEW MEDICATIONS STARTED DURING THIS VISIT:  New Prescriptions   CIPROFLOXACIN (CIPRO) 500 MG TABLET    Take 1 tablet (500 mg total) by mouth 2 (two) times daily.   METRONIDAZOLE (FLAGYL) 500 MG TABLET    Take 1 tablet (500 mg total) by mouth 2 (two) times daily.   PROMETHAZINE (PHENERGAN) 25 MG SUPPOSITORY    Place 1 suppository (25 mg total) rectally every 6 (six) hours as needed for nausea.   PROMETHAZINE (PHENERGAN) 25 MG TABLET    Take 1 tablet (25 mg total) by mouth every 8 (eight) hours as needed for nausea or vomiting.      Rockne Menghini, MD 04/23/16 1717    Rockne Menghini, MD 04/23/16 1759

## 2016-04-23 NOTE — ED Notes (Signed)
Pt was sleeping.  Nurse woke patient up to attempt PO challenge before discharge.  Patient's mother called to provide ride for patient to go home.

## 2016-04-23 NOTE — ED Notes (Signed)
Pt tolerated PO challenge well.

## 2016-04-23 NOTE — ED Notes (Signed)
Called lab to add urine preg 

## 2016-04-23 NOTE — Discharge Instructions (Signed)
Please take the entire course of antibiotics for your urinary tract infection and ear trichomoniasis, to complete your treatment. Please talk to your regular physician or go to the public health department to be tested for HIV; having 1 STD places a you at risk for also having HIV.  Return to the emergency department if you develop worsening symptoms, including pain, inability to keep down fluids, fever, or any other symptoms concerning to you.

## 2016-05-08 ENCOUNTER — Emergency Department
Admission: EM | Admit: 2016-05-08 | Discharge: 2016-05-08 | Disposition: A | Discharge status: Home / Self Care | Payer: Medicaid Other | Attending: Emergency Medicine | Admitting: Emergency Medicine

## 2016-05-08 ENCOUNTER — Encounter: Payer: Self-pay | Admitting: Emergency Medicine

## 2016-05-08 DIAGNOSIS — K0889 Other specified disorders of teeth and supporting structures: Secondary | ICD-10-CM

## 2016-05-08 DIAGNOSIS — J45909 Unspecified asthma, uncomplicated: Secondary | ICD-10-CM | POA: Insufficient documentation

## 2016-05-08 DIAGNOSIS — Z87891 Personal history of nicotine dependence: Secondary | ICD-10-CM | POA: Diagnosis not present

## 2016-05-08 DIAGNOSIS — Z79899 Other long term (current) drug therapy: Secondary | ICD-10-CM | POA: Insufficient documentation

## 2016-05-08 MED ORDER — DEXAMETHASONE SODIUM PHOSPHATE 10 MG/ML IJ SOLN
10.0000 mg | Freq: Once | INTRAMUSCULAR | Status: AC
  Administered 2016-05-08: 10 mg via INTRAMUSCULAR
  Filled 2016-05-08: qty 1

## 2016-05-08 MED ORDER — HYDROCODONE-ACETAMINOPHEN 5-325 MG PO TABS: 1.0000 | ORAL_TABLET | Freq: Four times a day (QID) | ORAL | 0 refills | Status: DC | PRN

## 2016-05-08 MED ORDER — AMOXICILLIN 500 MG PO TABS: 500.0000 mg | ORAL_TABLET | Freq: Three times a day (TID) | ORAL | 0 refills | Status: DC

## 2016-05-08 NOTE — ED Triage Notes (Signed)
States she had a tooth pulled this am  Now having increased swelling and pain

## 2016-05-08 NOTE — ED Provider Notes (Signed)
[facility] Emergency Department Provider Note  ____________________________________________  Time seen: Approximately 12:19 PM  I have reviewed the triage vital signs and the nursing notes.   HISTORY  Chief Complaint Dental Pain    HPI Kylie [name] is a 26 y.o. female , NAD, presents to the emergency department for evaluation of dental pain. Patient states she was seen at her dentist this morning to have a tooth pulled. States the tooth was removed due to significant breakage and cavities. States she was not given anything for pain and is noted through the day that her pain and some swelling has increased since the procedure. Has taken over-the-counter medications without any alleviation of pain. He is understanding that she has to rinse and cleanse the area and has been doing so. Denies any trauma, injury to the area. Has had no fevers, chills or body aches. Denies any swelling about the lips/tongue/throat.   Past Medical History:  Diagnosis Date  . Asthma   . Irritable bowel syndrome     There are no active problems to display for this patient.   History reviewed. No pertinent surgical history.  Prior to Admission medications   Medication Sig Start Date End Date Taking? Authorizing Provider  citalopram (CELEXA) 10 MG tablet Take 10 mg by mouth daily.   Yes Historical Provider, MD  amoxicillin (AMOXIL) 500 MG tablet Take 1 tablet (500 mg total) by mouth 3 (three) times daily with meals. 05/08/16   Ahron Hulbert L Dam Ashraf, PA-C  HYDROcodone-acetaminophen (NORCO) 5-325 MG tablet Take 1 tablet by mouth every 6 (six) hours as needed for severe pain. 05/08/16   Mare Ludtke L Cleto Claggett, PA-C    Allergies Patient has no known allergies.  No family history on file.  Social History Social History  Substance Use Topics  . Smoking status: Former Smoker    Packs/day: 0.50    Types: Cigarettes    Quit date: 04/09/2016  . Smokeless tobacco: Never Used  . Alcohol use No      Review of Systems  Constitutional: No fever/chills ENT: Positive dental pain. No swelling about the lips/tongue/throat. Musculoskeletal: Negative for general myalgias.  Skin: Positive dental swelling. Negative for rash or redness. Neurological: Negative for headaches  ____________________________________________   PHYSICAL EXAM:  VITAL SIGNS: ED Triage Vitals  Enc Vitals Group     BP 05/08/16 1150 117/64     Pulse Rate 05/08/16 1150 78     Resp 05/08/16 1150 20     Temp 05/08/16 1150 98.4 F (36.9 C)     Temp Source 05/08/16 1150 Oral     SpO2 05/08/16 1150 99 %     Weight 05/08/16 1149 135 lb (61.2 kg)     Height 05/08/16 1149 5\' 9"  (1.753 m)     Head Circumference --      Peak Flow --      Pain Score 05/08/16 1149 7     Pain Loc --      Pain Edu? --      Excl. in GC? --      Constitutional: Alert and oriented. Well appearing and in no acute distress but in pain and crying. Eyes: Conjunctivae are normal. Head: Atraumatic. ENT:      Nose: No epistaxis or rhinorrhea.      Mouth/Throat: Mucous membranes are moist. Poor dentition throughout. Tooth #20 has been removed with cavitation of the gumline noted where tooth was previously implanted. Mild bleeding to the area. Gumline is sensitive and tender  to the touch. Neck: No stridor. Supple with full range of motion. Hematological/Lymphatic/Immunilogical: No cervical lymphadenopathy. Cardiovascular:  Good peripheral circulation. Respiratory: Normal respiratory effort without tachypnea or retractions.  Neurologic:  Normal speech and language. No gross focal neurologic deficits are appreciated.  Skin:  Skin is warm, dry and intact. No rash noted. Psychiatric: Mood and affect are normal. Speech and behavior are normal. Patient exhibits appropriate insight and judgement.   ____________________________________________    LABS  None ____________________________________________  EKG  None ____________________________________________  RADIOLOGY  None ____________________________________________    PROCEDURES  Procedure(s) performed: None   Procedures   Medications  dexamethasone (DECADRON) injection 10 mg (10 mg Intramuscular Given 05/08/16 1244)   ____________________________________________   INITIAL IMPRESSION / ASSESSMENT AND PLAN / ED COURSE  Pertinent labs & imaging results that were available during my care of the patient were reviewed by me and considered in my medical decision making (see chart for details).     Patient's diagnosis is consistent with dental pain status post dental extraction. Patient will be discharged home with prescriptions for Norco and amoxicillin to take as directed. Patient is to continue follow instructions as given by her dentist in regards to post extraction care. Patient is to follow up with with her dentist in 1 day if symptoms persist past this treatment course. Patient is given ED precautions to return to the ED for any worsening or new symptoms.   ____________________________________________  FINAL CLINICAL IMPRESSION(S) / ED DIAGNOSES  Final diagnoses:  Pain, dental      NEW MEDICATIONS STARTED DURING THIS VISIT:  New Prescriptions   AMOXICILLIN (AMOXIL) 500 MG TABLET    Take 1 tablet (500 mg total) by mouth 3 (three) times daily with meals.   HYDROCODONE-ACETAMINOPHEN (NORCO) 5-325 MG TABLET    Take 1 tablet by mouth every 6 (six) hours as needed for severe pain.         Hope Pigeon, PA-C 05/08/16 1339    Arnaldo Natal, MD 05/08/16 [id number]

## 2016-05-13 ENCOUNTER — Emergency Department
Admission: EM | Admit: 2016-05-13 | Discharge: 2016-05-13 | Disposition: A | Discharge status: Home / Self Care | Payer: Medicaid Other | Attending: Emergency Medicine | Admitting: Emergency Medicine

## 2016-05-13 ENCOUNTER — Encounter: Payer: Self-pay | Admitting: Emergency Medicine

## 2016-05-13 DIAGNOSIS — J0100 Acute maxillary sinusitis, unspecified: Secondary | ICD-10-CM

## 2016-05-13 DIAGNOSIS — J029 Acute pharyngitis, unspecified: Secondary | ICD-10-CM | POA: Diagnosis present

## 2016-05-13 DIAGNOSIS — J45909 Unspecified asthma, uncomplicated: Secondary | ICD-10-CM | POA: Insufficient documentation

## 2016-05-13 DIAGNOSIS — Z87891 Personal history of nicotine dependence: Secondary | ICD-10-CM | POA: Diagnosis not present

## 2016-05-13 MED ORDER — FEXOFENADINE-PSEUDOEPHED ER 60-120 MG PO TB12: 1.0000 | ORAL_TABLET | Freq: Two times a day (BID) | ORAL | 0 refills | Status: DC

## 2016-05-13 MED ORDER — LIDOCAINE VISCOUS 2 % MT SOLN: 5.0000 mL | Freq: Four times a day (QID) | OROMUCOSAL | 0 refills | Status: DC | PRN

## 2016-05-13 MED ORDER — AMOXICILLIN 500 MG PO CAPS: 500.0000 mg | ORAL_CAPSULE | Freq: Three times a day (TID) | ORAL | 0 refills | Status: DC

## 2016-05-13 MED ORDER — IBUPROFEN 600 MG PO TABS: 600.0000 mg | ORAL_TABLET | Freq: Three times a day (TID) | ORAL | 0 refills | Status: DC | PRN

## 2016-05-13 MED ORDER — FIRST-DUKES MOUTHWASH MT SUSP: 10.0000 mL | Freq: Four times a day (QID) | OROMUCOSAL | 0 refills | Status: DC

## 2016-05-13 NOTE — ED Provider Notes (Signed)
[facility] Emergency Department Provider Note   ____________________________________________   None    (approximate)  I have reviewed the triage vital signs and the nursing notes.   HISTORY  Chief Complaint Sore Throat; Cough; and Nasal Congestion    HPI Kylie [name] is a 26 y.o. female patient complainingof sore throat, nasal congestion, cough, and body aches for 2 weeks. Patient stated no relief over-the-counter medications. Patient state complaint is worsening the last 3-5 days. Patient had taken a flu shot this season. Patient recently pain as a 7/10. Patient arrives pain as "achy". Patient able to tolerate food and fluids.   Past Medical History:  Diagnosis Date  . Asthma   . Irritable bowel syndrome     There are no active problems to display for this patient.   History reviewed. No pertinent surgical history.  Prior to Admission medications   Medication Sig Start Date End Date Taking? Authorizing Provider  amoxicillin (AMOXIL) 500 MG capsule Take 1 capsule (500 mg total) by mouth 3 (three) times daily. 05/13/16   Joni Reining, PA-C  amoxicillin (AMOXIL) 500 MG tablet Take 1 tablet (500 mg total) by mouth 3 (three) times daily with meals. 05/08/16   Jami L Hagler, PA-C  citalopram (CELEXA) 10 MG tablet Take 10 mg by mouth daily.    Historical Provider, MD  Diphenhyd-Hydrocort-Nystatin (FIRST-DUKES MOUTHWASH) SUSP Use as directed 10 mLs in the mouth or throat 4 (four) times daily. 05/13/16   Joni Reining, PA-C  fexofenadine-pseudoephedrine (ALLEGRA-D) 60-120 MG 12 hr tablet Take 1 tablet by mouth 2 (two) times daily. 05/13/16   Joni Reining, PA-C  HYDROcodone-acetaminophen (NORCO) 5-325 MG tablet Take 1 tablet by mouth every 6 (six) hours as needed for severe pain. 05/08/16   Jami L Hagler, PA-C  ibuprofen (ADVIL,MOTRIN) 600 MG tablet Take 1 tablet (600 mg total) by mouth every 8 (eight) hours as needed. 05/13/16   Joni Reining, PA-C    lidocaine (XYLOCAINE) 2 % solution Use as directed 5 mLs in the mouth or throat every 6 (six) hours as needed for mouth pain. Mixed with mouthwash swish and swallow 05/13/16   Joni Reining, PA-C    Allergies Patient has no known allergies.  History reviewed. No pertinent family history.  Social History Social History  Substance Use Topics  . Smoking status: Former Smoker    Packs/day: 0.50    Types: Cigarettes    Quit date: 04/09/2016  . Smokeless tobacco: Never Used  . Alcohol use No    Review of Systems Constitutional: No fever/chills Eyes: No visual changes. ENT: Sore throat and nasal congestion.  Cardiovascular: Denies chest pain. Respiratory: Denies shortness of breath.Nonproductive cough  Gastrointestinal: No abdominal pain.  No nausea, no vomiting.  No diarrhea.  No constipation. Genitourinary: Negative for dysuria. Musculoskeletal: Negative for back pain. Skin: Negative for rash. Neurological: Negative for headaches, focal weakness or numbness. Psychiatric:Depression ____________________________________________   PHYSICAL EXAM:  VITAL SIGNS: ED Triage Vitals  Enc Vitals Group     BP 05/13/16 0813 (!) 131/92     Pulse Rate 05/13/16 0813 74     Resp 05/13/16 0813 20     Temp 05/13/16 0813 98.2 F (36.8 C)     Temp Source 05/13/16 0813 Oral     SpO2 05/13/16 0813 97 %     Weight 05/13/16 0808 135 lb (61.2 kg)     Height --      Head Circumference --  Peak Flow --      Pain Score 05/13/16 0808 7     Pain Loc --      Pain Edu? --      Excl. in GC? --     Constitutional: Alert and oriented. Well appearing and in no acute distress. Eyes: Conjunctivae are normal. PERRL. EOMI. Head: Atraumatic. Nose:Edematous nasal turbinates take rhinorrhea. Bilateral frontal and maxillary guarding with palpation. Mouth/Throat: Mucous membranes are moist.  Oropharynx non-erythematous. Neck: No stridor. *No cervical spine tenderness to palpation. Cardiovascular:  Normal rate, regular rhythm. Grossly normal heart sounds.  Good peripheral circulation. Respiratory: Normal respiratory effort.  No retractions. Lungs CTAB. Gastrointestinal: Soft and nontender. No distention. No abdominal bruits. No CVA tenderness. Musculoskeletal: No lower extremity tenderness nor edema.  No joint effusions. Neurologic:  Normal speech and language. No gross focal neurologic deficits are appreciated. No gait instability. Skin:  Skin is warm, dry and intact. No rash noted. Psychiatric: Mood and affect are normal. Speech and behavior are normal.  ____________________________________________   LABS (all labs ordered are listed, but only abnormal results are displayed)  Labs Reviewed - No data to display ____________________________________________  EKG   ____________________________________________  RADIOLOGY   ____________________________________________   PROCEDURES  Procedure(s) performed: None  Procedures  Critical Care performed: No  ____________________________________________   INITIAL IMPRESSION / ASSESSMENT AND PLAN / ED COURSE  Pertinent labs & imaging results that were available during my care of the patient were reviewed by me and considered in my medical decision making (see chart for details).  Maxillary sinusitis and sore throat. Patient given discharge instructions. Patient given prescription for amoxicillin, Dukes mouthwash, viscous lidocaine, Allegra-D, and ibuprofen. Patient given a work no. Patient advised follow-up family condition persists one in 5 days.      ____________________________________________   FINAL CLINICAL IMPRESSION(S) / ED DIAGNOSES  Final diagnoses:  Subacute maxillary sinusitis  Sore throat      NEW MEDICATIONS STARTED DURING THIS VISIT:  New Prescriptions   AMOXICILLIN (AMOXIL) 500 MG CAPSULE    Take 1 capsule (500 mg total) by mouth 3 (three) times daily.   DIPHENHYD-HYDROCORT-NYSTATIN  (FIRST-DUKES MOUTHWASH) SUSP    Use as directed 10 mLs in the mouth or throat 4 (four) times daily.   FEXOFENADINE-PSEUDOEPHEDRINE (ALLEGRA-D) 60-120 MG 12 HR TABLET    Take 1 tablet by mouth 2 (two) times daily.   IBUPROFEN (ADVIL,MOTRIN) 600 MG TABLET    Take 1 tablet (600 mg total) by mouth every 8 (eight) hours as needed.   LIDOCAINE (XYLOCAINE) 2 % SOLUTION    Use as directed 5 mLs in the mouth or throat every 6 (six) hours as needed for mouth pain. Mixed with mouthwash swish and swallow     Note:  This document was prepared using Dragon voice recognition software and may include unintentional dictation errors.    Joni Reiningonald K Felis Quillin, PA-C 05/13/16 [id number]    Nita Sicklearolina Veronese, MD 05/16/16 [phone]

## 2016-05-13 NOTE — ED Notes (Signed)
See triage note  States she has a cough and feels congested  Cough is occasionally prod  Denies any fever but has had chills  Also body aches over the past 3-5 days

## 2016-05-13 NOTE — ED Triage Notes (Signed)
Pt to ed with c/o cough, congestion, body aches, and sore throat x 2 weeks.

## 2016-05-14 LAB — POCT RAPID STREP A: Streptococcus, Group A Screen (Direct): NEGATIVE

## 2016-06-03 ENCOUNTER — Emergency Department: Payer: No Typology Code available for payment source

## 2016-06-03 ENCOUNTER — Emergency Department
Admission: EM | Admit: 2016-06-03 | Discharge: 2016-06-03 | Disposition: A | Discharge status: Home / Self Care | Payer: No Typology Code available for payment source | Attending: Emergency Medicine | Admitting: Emergency Medicine

## 2016-06-03 ENCOUNTER — Encounter: Payer: Self-pay | Admitting: Emergency Medicine

## 2016-06-03 DIAGNOSIS — Z87891 Personal history of nicotine dependence: Secondary | ICD-10-CM | POA: Insufficient documentation

## 2016-06-03 DIAGNOSIS — S59912A Unspecified injury of left forearm, initial encounter: Secondary | ICD-10-CM | POA: Diagnosis present

## 2016-06-03 DIAGNOSIS — Y999 Unspecified external cause status: Secondary | ICD-10-CM | POA: Insufficient documentation

## 2016-06-03 DIAGNOSIS — Y92410 Unspecified street and highway as the place of occurrence of the external cause: Secondary | ICD-10-CM | POA: Insufficient documentation

## 2016-06-03 DIAGNOSIS — Y9389 Activity, other specified: Secondary | ICD-10-CM | POA: Insufficient documentation

## 2016-06-03 DIAGNOSIS — J45909 Unspecified asthma, uncomplicated: Secondary | ICD-10-CM | POA: Insufficient documentation

## 2016-06-03 DIAGNOSIS — S40012A Contusion of left shoulder, initial encounter: Secondary | ICD-10-CM | POA: Insufficient documentation

## 2016-06-03 DIAGNOSIS — S5012XA Contusion of left forearm, initial encounter: Secondary | ICD-10-CM | POA: Diagnosis not present

## 2016-06-03 DIAGNOSIS — S40022A Contusion of left upper arm, initial encounter: Secondary | ICD-10-CM

## 2016-06-03 MED ORDER — ACETAMINOPHEN 500 MG PO TABS
1000.0000 mg | ORAL_TABLET | Freq: Once | ORAL | Status: AC
  Administered 2016-06-03: 1000 mg via ORAL
  Filled 2016-06-03: qty 2

## 2016-06-03 MED ORDER — HYDROCODONE-ACETAMINOPHEN 5-325 MG PO TABS: 1.0000 | ORAL_TABLET | ORAL | 0 refills | Status: DC | PRN

## 2016-06-03 NOTE — Discharge Instructions (Signed)
[facility] if any continued problems. Ice to arm as needed for swelling and pain. Begin taking Norco as needed for pain only as directed.   You may not drive or operate machinery while taking this medication. After this he may take Tylenol or ibuprofen as needed for pain.
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
## 2016-07-15 DIAGNOSIS — F418 Other specified anxiety disorders: Secondary | ICD-10-CM | POA: Insufficient documentation

## 2016-07-15 DIAGNOSIS — N644 Mastodynia: Secondary | ICD-10-CM | POA: Insufficient documentation

## 2016-07-15 LAB — HM HIV SCREENING LAB: HM HIV Screening: NEGATIVE

## 2016-08-14 ENCOUNTER — Encounter: Payer: Self-pay | Admitting: Emergency Medicine

## 2016-08-14 DIAGNOSIS — Z87891 Personal history of nicotine dependence: Secondary | ICD-10-CM | POA: Diagnosis not present

## 2016-08-14 DIAGNOSIS — R1032 Left lower quadrant pain: Secondary | ICD-10-CM | POA: Diagnosis present

## 2016-08-14 DIAGNOSIS — J45909 Unspecified asthma, uncomplicated: Secondary | ICD-10-CM | POA: Diagnosis not present

## 2016-08-14 DIAGNOSIS — A599 Trichomoniasis, unspecified: Secondary | ICD-10-CM | POA: Insufficient documentation

## 2016-08-14 LAB — CBC
HCT: 38.2 % (ref 35.0–47.0)
Hemoglobin: 13.1 g/dL (ref 12.0–16.0)
MCH: 29.5 pg (ref 26.0–34.0)
MCHC: 34.2 g/dL (ref 32.0–36.0)
MCV: 86.1 fL (ref 80.0–100.0)
Platelets: 201 10*3/uL (ref 150–440)
RBC: 4.43 MIL/uL (ref 3.80–5.20)
RDW: 14.7 % — AB (ref 11.5–14.5)
WBC: 6 10*3/uL (ref 3.6–11.0)

## 2016-08-14 LAB — URINALYSIS, COMPLETE (UACMP) WITH MICROSCOPIC
BACTERIA UA: NONE SEEN
Bilirubin Urine: NEGATIVE
Glucose, UA: NEGATIVE mg/dL
Ketones, ur: NEGATIVE mg/dL
Nitrite: NEGATIVE
Protein, ur: NEGATIVE mg/dL
SPECIFIC GRAVITY, URINE: 1.002 — AB (ref 1.005–1.030)
pH: 7 (ref 5.0–8.0)

## 2016-08-14 LAB — COMPREHENSIVE METABOLIC PANEL
ALK PHOS: 76 U/L (ref 38–126)
ALT: 12 U/L — AB (ref 14–54)
AST: 15 U/L (ref 15–41)
Albumin: 4 g/dL (ref 3.5–5.0)
Anion gap: 7 (ref 5–15)
BILIRUBIN TOTAL: 0.4 mg/dL (ref 0.3–1.2)
BUN: 12 mg/dL (ref 6–20)
CALCIUM: 8.9 mg/dL (ref 8.9–10.3)
CHLORIDE: 104 mmol/L (ref 101–111)
CO2: 28 mmol/L (ref 22–32)
CREATININE: 0.62 mg/dL (ref 0.44–1.00)
GFR calc Af Amer: >60 mL/min (ref >60)
Glucose, Bld: 99 mg/dL (ref 65–99)
Potassium: 3.8 mmol/L (ref 3.5–5.1)
Sodium: 139 mmol/L (ref 135–145)
TOTAL PROTEIN: 8 g/dL (ref 6.5–8.1)

## 2016-08-14 LAB — POCT PREGNANCY, URINE: PREG TEST UR: NEGATIVE

## 2016-08-14 LAB — LIPASE, BLOOD: Lipase: 30 U/L (ref 11–51)

## 2016-08-14 NOTE — ED Triage Notes (Signed)
Pt ambulatory to triage with steady gait, no distress noted. Pt reports she has had lower left abdominal pain x3 days. Pt sts she was diagnosed with an ovarian cyst last year and was unable to follow up with referral to OBGYN afterwards.

## 2016-08-15 ENCOUNTER — Emergency Department
Admission: EM | Admit: 2016-08-15 | Discharge: 2016-08-15 | Disposition: A | Discharge status: Home / Self Care | Payer: Medicaid Other | Attending: Student in an Organized Health Care Education/Training Program | Admitting: Student in an Organized Health Care Education/Training Program

## 2016-08-15 ENCOUNTER — Emergency Department: Payer: Medicaid Other

## 2016-08-15 ENCOUNTER — Telehealth: Payer: Self-pay | Admitting: Emergency Medicine

## 2016-08-15 DIAGNOSIS — R1032 Left lower quadrant pain: Secondary | ICD-10-CM

## 2016-08-15 DIAGNOSIS — A599 Trichomoniasis, unspecified: Secondary | ICD-10-CM

## 2016-08-15 LAB — CHLAMYDIA/NGC RT PCR (ARMC ONLY)
Chlamydia Tr: DETECTED — AB
N gonorrhoeae: NOT DETECTED

## 2016-08-15 LAB — WET PREP, GENITAL
Clue Cells Wet Prep HPF POC: NONE SEEN
SPERM: NONE SEEN
YEAST WET PREP: NONE SEEN

## 2016-08-15 MED ORDER — SODIUM CHLORIDE 0.9 % IV BOLUS (SEPSIS)
1000.0000 mL | Freq: Once | INTRAVENOUS | Status: AC
  Administered 2016-08-15: 1000 mL via INTRAVENOUS

## 2016-08-15 MED ORDER — MORPHINE SULFATE (PF) 4 MG/ML IV SOLN
4.0000 mg | INTRAVENOUS | Status: DC | PRN
  Administered 2016-08-15: 4 mg via INTRAVENOUS
  Filled 2016-08-15: qty 1

## 2016-08-15 MED ORDER — METRONIDAZOLE 250 MG PO TABS: 250.0000 mg | ORAL_TABLET | Freq: Three times a day (TID) | ORAL | 0 refills | Status: AC

## 2016-08-15 MED ORDER — PROMETHAZINE HCL 25 MG/ML IJ SOLN
12.5000 mg | Freq: Once | INTRAMUSCULAR | Status: AC
  Administered 2016-08-15: 12.5 mg via INTRAVENOUS
  Filled 2016-08-15: qty 1

## 2016-08-15 MED ORDER — KETOROLAC TROMETHAMINE 30 MG/ML IJ SOLN
15.0000 mg | Freq: Once | INTRAMUSCULAR | Status: AC
  Administered 2016-08-15: 15 mg via INTRAVENOUS
  Filled 2016-08-15: qty 1

## 2016-08-15 MED ORDER — PROMETHAZINE HCL 12.5 MG PO TABS: 12.5000 mg | ORAL_TABLET | Freq: Four times a day (QID) | ORAL | 0 refills | Status: DC | PRN

## 2016-08-15 NOTE — ED Provider Notes (Signed)
[facility] Emergency Department Provider Note    None    (approximate)  I have reviewed the triage vital signs and the nursing notes.   HISTORY  Chief Complaint Abdominal Pain    HPI Kylie [name] is a 26 y.o. female with history of irritable bowel syndrome as well as previously diagnosed left ovarian cyst presents with acute left lower abdominal pain. States that she noted the pain worsening over the past 3 days if she just completed her period Restasis pain suddenly became worse to 10 out of 10 in severity around 8 PM tonight. Does feel nauseated but no vomiting. Denies any vaginal discharge. Is still having vaginal spotting. Denies any back pain. No diarrhea. No dysuria.   Past Medical History:  Diagnosis Date  . Asthma   . Irritable bowel syndrome    History reviewed. No pertinent family history. History reviewed. No pertinent surgical history. There are no active problems to display for this patient.     Prior to Admission medications   Medication Sig Start Date End Date Taking? Authorizing Provider  citalopram (CELEXA) 10 MG tablet Take 10 mg by mouth daily.    [provider]  HYDROcodone-acetaminophen (NORCO/VICODIN) 5-325 MG tablet Take 1 tablet by mouth every 4 (four) hours as needed for moderate pain. 06/03/16   Tommi Rumps, PA-C  metroNIDAZOLE (FLAGYL) 250 MG tablet Take 1 tablet (250 mg total) by mouth 3 (three) times daily. 08/15/16 08/22/16  Willy Eddy, MD  promethazine (PHENERGAN) 12.5 MG tablet Take 1 tablet (12.5 mg total) by mouth every 6 (six) hours as needed for nausea or vomiting. 08/15/16   Willy Eddy, MD    Allergies Patient has no known allergies.    Social History Social History  Substance Use Topics  . Smoking status: Former Smoker    Packs/day: 0.50    Types: Cigarettes    Quit date: 04/09/2016  . Smokeless tobacco: Never Used  . Alcohol use No    Review of Systems Patient denies  headaches, rhinorrhea, blurry vision, numbness, shortness of breath, chest pain, edema, cough, abdominal pain, nausea, vomiting, diarrhea, dysuria, fevers, rashes or hallucinations unless otherwise stated above in HPI. ____________________________________________   PHYSICAL EXAM:  VITAL SIGNS: Vitals:   08/15/16 0145 08/15/16 0243  BP: (!) 121/92 116/70  Pulse: 61 68  Resp: 18 20  Temp:      Constitutional: Alert and oriented.  in no acute distress. Eyes: Conjunctivae are normal. PERRL. EOMI. Head: Atraumatic. Nose: No congestion/rhinnorhea. Mouth/Throat: Mucous membranes are moist.  Oropharynx non-erythematous. Neck: No stridor. Painless ROM. No cervical spine tenderness to palpation Hematological/Lymphatic/Immunilogical: No cervical lymphadenopathy. Cardiovascular: Normal rate, regular rhythm. Grossly normal heart sounds.  Good peripheral circulation. Respiratory: Normal respiratory effort.  No retractions. Lungs CTAB. Gastrointestinal: Soft with mild ttp of llq with rebound or guardingNo distention. No abdominal bruits. No CVA tenderness. Musculoskeletal: No lower extremity tenderness nor edema.  No joint effusions. Neurologic:  Normal speech and language. No gross focal neurologic deficits are appreciated. No gait instability. Skin:  Skin is warm, dry and intact. No rash noted. Psychiatric: Mood and affect are normal. Speech and behavior are normal.  ____________________________________________   LABS (all labs ordered are listed, but only abnormal results are displayed)  Results for orders placed or performed during the hospital encounter of 08/15/16 (from the past 24 hour(s))  Lipase, blood     Status: None   Collection Time: 08/14/16 10:59 PM  Result Value Ref Range  Lipase 30 11 - 51 U/L  Comprehensive metabolic panel     Status: Abnormal   Collection Time: 08/14/16 10:59 PM  Result Value Ref Range   Sodium 139 135 - 145 mmol/L   Potassium 3.8 3.5 - 5.1 mmol/L    Chloride 104 101 - 111 mmol/L   CO2 28 22 - 32 mmol/L   Glucose, Bld 99 65 - 99 mg/dL   BUN 12 6 - 20 mg/dL   Creatinine, Ser 4.780.62 0.44 - 1.00 mg/dL   Calcium 8.9 8.9 - 29.510.3 mg/dL   Total Protein 8.0 6.5 - 8.1 g/dL   Albumin 4.0 3.5 - 5.0 g/dL   AST 15 15 - 41 U/L   ALT 12 (L) 14 - 54 U/L   Alkaline Phosphatase 76 38 - 126 U/L   Total Bilirubin 0.4 0.3 - 1.2 mg/dL   GFR calc non Af Amer >60 >60 mL/min   GFR calc Af Amer >60 >60 mL/min   Anion gap 7 5 - 15  CBC     Status: Abnormal   Collection Time: 08/14/16 10:59 PM  Result Value Ref Range   WBC 6.0 3.6 - 11.0 K/uL   RBC 4.43 3.80 - 5.20 MIL/uL   Hemoglobin 13.1 12.0 - 16.0 g/dL   HCT 62.138.2 30.835.0 - 65.747.0 %   MCV 86.1 80.0 - 100.0 fL   MCH 29.5 26.0 - 34.0 pg   MCHC 34.2 32.0 - 36.0 g/dL   RDW 84.614.7 (H) 96.211.5 - 95.214.5 %   Platelets 201 150 - 440 K/uL  Urinalysis, Complete w Microscopic     Status: Abnormal   Collection Time: 08/14/16 10:59 PM  Result Value Ref Range   Color, Urine STRAW (A) YELLOW   APPearance CLEAR (A) CLEAR   Specific Gravity, Urine 1.002 (L) 1.005 - 1.030   pH 7.0 5.0 - 8.0   Glucose, UA NEGATIVE NEGATIVE mg/dL   Hgb urine dipstick LARGE (A) NEGATIVE   Bilirubin Urine NEGATIVE NEGATIVE   Ketones, ur NEGATIVE NEGATIVE mg/dL   Protein, ur NEGATIVE NEGATIVE mg/dL   Nitrite NEGATIVE NEGATIVE   Leukocytes, UA SMALL (A) NEGATIVE   RBC / HPF 0-5 0 - 5 RBC/hpf   WBC, UA 0-5 0 - 5 WBC/hpf   Bacteria, UA NONE SEEN NONE SEEN   Squamous Epithelial / LPF 0-5 (A) NONE SEEN   Mucous PRESENT   Pregnancy, urine POC     Status: None   Collection Time: 08/14/16 11:07 PM  Result Value Ref Range   Preg Test, Ur NEGATIVE NEGATIVE  Wet prep, genital     Status: Abnormal   Collection Time: 08/15/16  3:02 AM  Result Value Ref Range   Yeast Wet Prep HPF POC NONE SEEN NONE SEEN   Trich, Wet Prep PRESENT (A) NONE SEEN   Clue Cells Wet Prep HPF POC NONE SEEN NONE SEEN   WBC, Wet Prep HPF POC FEW (A) NONE SEEN   Sperm NONE  SEEN    ____________________________________________  EKG____________________________________________  RADIOLOGY  I personally reviewed all radiographic images ordered to evaluate for the above acute complaints and reviewed radiology reports and findings.  These findings were personally discussed with the patient.  Please see medical record for radiology report.  ____________________________________________   PROCEDURES  Procedure(s) performed:  Procedures    Critical Care performed: no ____________________________________________   INITIAL IMPRESSION / ASSESSMENT AND PLAN / ED COURSE  Pertinent labs & imaging results that were available during my care of the patient  were reviewed by me and considered in my medical decision making (see chart for details).  DDX: cystu, torsion, toa, ectopic, uti, colitis  ZYAH GOMM is a 26 y.o. who presents to the ED with left lower quadrant abdominal pain as described above.  Patient is AFVSS in ED. Exam as above. Given current presentation have considered the above differential. The pain related to cyst. We'll order ultrasound evaluate. Blood work is fortunately reassuring and the patient is not pregnant.  The patient will be placed on continuous pulse oximetry and telemetry for monitoring.  Laboratory evaluation will be sent to evaluate for the above complaints.     Clinical Course as of Aug 16 342  Thu Aug 15, 2016  0300 Patient reassessed. Abdominal exam benign. Ultrasound is reassuring. Do not feel that CT imaging clinically indicated.  [PR]  [phone] Patient with evidence of trichomoniasis. We'll start patient on appropriate treatment. Patient otherwise stable for discharge home for further workup as an outpatient.  [PR]    Clinical Course User Index [PR] Willy Eddy, MD     ____________________________________________   FINAL CLINICAL IMPRESSION(S) / ED DIAGNOSES  Final diagnoses:  LLQ pain  Trichimoniasis       NEW MEDICATIONS STARTED DURING THIS VISIT:  New Prescriptions   METRONIDAZOLE (FLAGYL) 250 MG TABLET    Take 1 tablet (250 mg total) by mouth 3 (three) times daily.   PROMETHAZINE (PHENERGAN) 12.5 MG TABLET    Take 1 tablet (12.5 mg total) by mouth every 6 (six) hours as needed for nausea or vomiting.     Note:  This document was prepared using Dragon voice recognition software and may include unintentional dictation errors.    Willy Eddy, MD 08/15/16 [phone]

## 2016-08-15 NOTE — ED Notes (Signed)
Pt went to ultrasound.

## 2016-08-15 NOTE — ED Notes (Signed)
Pt returned from ultrasound

## 2016-08-15 NOTE — Telephone Encounter (Addendum)
Called patient due to test result chlamydia detected.  Was not treated in the ED.  No answer and no voicemail.  Will try later and send letter if patient not contacted.  Would like her to go to [phone]--[phone] for treatment.    5/25--contacted patient by phone.  I explained result and need for treatment.  I told her I could call in azithromycin 1 gram po per dr Darnelle Catalanmalinda, but that he recommended treatment for both GC and chlamydia.  I gave her [facility] number for std clinic and she agrees to ccall them now.  I will call the med [facility] [address] and she will let them know if she is just going [facility] with the treatment for GC.  Explained need for partner tx.

## 2016-08-15 NOTE — Discharge Instructions (Signed)

## 2016-10-15 ENCOUNTER — Emergency Department
Admission: EM | Admit: 2016-10-15 | Discharge: 2016-10-15 | Disposition: A | Discharge status: Home / Self Care | Payer: Medicaid Other | Attending: Emergency Medicine | Admitting: Emergency Medicine

## 2016-10-15 ENCOUNTER — Encounter: Payer: Self-pay | Admitting: Emergency Medicine

## 2016-10-15 ENCOUNTER — Emergency Department: Payer: Medicaid Other

## 2016-10-15 DIAGNOSIS — S99921A Unspecified injury of right foot, initial encounter: Secondary | ICD-10-CM | POA: Diagnosis present

## 2016-10-15 DIAGNOSIS — Y939 Activity, unspecified: Secondary | ICD-10-CM | POA: Diagnosis not present

## 2016-10-15 DIAGNOSIS — J45909 Unspecified asthma, uncomplicated: Secondary | ICD-10-CM | POA: Diagnosis not present

## 2016-10-15 DIAGNOSIS — W270XXA Contact with workbench tool, initial encounter: Secondary | ICD-10-CM | POA: Insufficient documentation

## 2016-10-15 DIAGNOSIS — Y929 Unspecified place or not applicable: Secondary | ICD-10-CM | POA: Diagnosis not present

## 2016-10-15 DIAGNOSIS — Y999 Unspecified external cause status: Secondary | ICD-10-CM | POA: Diagnosis not present

## 2016-10-15 DIAGNOSIS — Z87891 Personal history of nicotine dependence: Secondary | ICD-10-CM | POA: Insufficient documentation

## 2016-10-15 DIAGNOSIS — Z79899 Other long term (current) drug therapy: Secondary | ICD-10-CM | POA: Diagnosis not present

## 2016-10-15 NOTE — ED Notes (Signed)
Pt instructed on crutch use.

## 2016-10-15 NOTE — ED Notes (Signed)
See triage note  States she dropped a hammer on to right foot  Having pain to top of foot below great toe

## 2016-10-15 NOTE — ED Triage Notes (Signed)
Pt to ed with c/o right foot pain post hammer falling onto it today.  Pt with mild swelling noted, and pain with ambulation.

## 2016-10-15 NOTE — ED Provider Notes (Signed)
[facility] Emergency Department Provider Note  ____________________________________________  Time seen: Approximately 1:32 PM  I have reviewed the triage vital signs and the nursing notes.   HISTORY  Chief Complaint Foot Pain    HPI Kylie [name] is a 26 y.o. female that presents to the emergency departmentwith right foot pain after dropping a hammer on her foot this morning. She is having pain with moving her big toe. No additional injuries. She denies shortness breath, chest pain, nausea, vomiting, abdominal pain, numbness, tingling.   Past Medical History:  Diagnosis Date  . Asthma   . Irritable bowel syndrome     There are no active problems to display for this patient.   History reviewed. No pertinent surgical history.  Prior to Admission medications   Medication Sig Start Date End Date Taking? Authorizing Provider  citalopram (CELEXA) 10 MG tablet Take 10 mg by mouth daily.    [provider]  HYDROcodone-acetaminophen (NORCO/VICODIN) 5-325 MG tablet Take 1 tablet by mouth every 4 (four) hours as needed for moderate pain. 06/03/16   Tommi RumpsSummers, Rhonda L, PA-C  promethazine (PHENERGAN) 12.5 MG tablet Take 1 tablet (12.5 mg total) by mouth every 6 (six) hours as needed for nausea or vomiting. 08/15/16   Willy Eddyobinson, Patrick, MD    Allergies Patient has no known allergies.  History reviewed. No pertinent family history.  Social History Social History  Substance Use Topics  . Smoking status: Former Smoker    Packs/day: 0.50    Types: Cigarettes    Quit date: 04/09/2016  . Smokeless tobacco: Never Used  . Alcohol use No     Review of Systems  Constitutional: No fever/chills Cardiovascular: No chest pain. Respiratory: No SOB. Gastrointestinal: No abdominal pain.  No nausea, no vomiting.  Musculoskeletal: Positive for pain. Skin: Negative for rash, abrasions, lacerations, ecchymosis. Neurological: Negative for headaches, numbness or  tingling   ____________________________________________   PHYSICAL EXAM:  VITAL SIGNS: ED Triage Vitals  Enc Vitals Group     BP 10/15/16 1250 112/81     Pulse Rate 10/15/16 1250 87     Resp 10/15/16 1250 18     Temp 10/15/16 1250 98.1 F (36.7 C)     Temp Source 10/15/16 1250 Oral     SpO2 10/15/16 1250 100 %     Weight 10/15/16 1246 135 lb (61.2 kg)     Height 10/15/16 1246 5\' 9"  (1.753 m)     Head Circumference --      Peak Flow --      Pain Score 10/15/16 1245 7     Pain Loc --      Pain Edu? --      Excl. in GC? --      Constitutional: Alert and oriented. Well appearing and in no acute distress. Eyes: Conjunctivae are normal. PERRL. EOMI. Head: Atraumatic. ENT:      Ears:      Nose: No congestion/rhinnorhea.      Mouth/Throat: Mucous membranes are moist.  Neck: No stridor.  Cardiovascular: Normal rate, regular rhythm.  Good peripheral circulation. 2+ dorsalis pedis pulses. Respiratory: Normal respiratory effort without tachypnea or retractions. Lungs CTAB. Good air entry to the bases with no decreased or absent breath sounds. Musculoskeletal: No gross deformities appreciated. Mild swelling to the base of great toe with tenderness to palpation. Neurologic:  Normal speech and language. No gross focal neurologic deficits are appreciated. Sensation of toes intact. Skin:  Skin is warm, dry and intact.  ____________________________________________   LABS (all labs ordered are listed, but only abnormal results are displayed)  Labs Reviewed - No data to display ____________________________________________  EKG   ____________________________________________  RADIOLOGY Lexine Baton, personally viewed and evaluated these images (plain radiographs) as part of my medical decision making, as well as reviewing the written report by the radiologist.  Dg Foot Complete Right  Result Date: 10/15/2016 CLINICAL DATA:  26 year old who dropped a hammer onto the dorsum  of the right foot earlier today. Pain localizing to the medial foot. Initial encounter. EXAM: RIGHT FOOT COMPLETE - 3+ VIEW COMPARISON:  None. FINDINGS: No evidence of acute fracture or dislocation. Joint spaces well preserved. Well-preserved bone mineral density. No intrinsic osseous abnormalities. IMPRESSION: Normal examination. Electronically Signed   By: Hulan Saas M.D.   On: 10/15/2016 13:11    ____________________________________________    PROCEDURES  Procedure(s) performed:    Procedures    Medications - No data to display   ____________________________________________   INITIAL IMPRESSION / ASSESSMENT AND PLAN / ED COURSE  Pertinent labs & imaging results that were available during my care of the patient were reviewed by me and considered in my medical decision making (see chart for details).  Review of the [place] CSRS was performed in accordance of the [facility] prior to dispensing any controlled drugs.     Patient presented to the emergency department for evaluation of foot pain after dropping a hammer on her foot. Vital signs and exam are reassuring. X-ray negative for acute bony abnormalities. Postop shoe and crutches were given. Patient is to follow up with PCP as directed. Patient is given ED precautions to return to the ED for any worsening or new symptoms.     ____________________________________________  FINAL CLINICAL IMPRESSION(S) / ED DIAGNOSES  Final diagnoses:  Injury of right foot, initial encounter      NEW MEDICATIONS STARTED DURING THIS VISIT:  Discharge Medication List as of 10/15/2016  2:06 PM          This chart was dictated using voice recognition software/Dragon. Despite best efforts to proofread, errors can occur which can change the meaning. Any change was purely unintentional.    Enid Derry, PA-C 10/15/16 1557    Jene Every, MD 10/29/16 [phone]

## 2017-04-20 ENCOUNTER — Encounter: Payer: Self-pay | Admitting: Emergency Medicine

## 2017-04-20 ENCOUNTER — Emergency Department: Payer: Medicaid Other

## 2017-04-20 ENCOUNTER — Other Ambulatory Visit: Payer: Self-pay

## 2017-04-20 ENCOUNTER — Emergency Department
Admission: EM | Admit: 2017-04-20 | Discharge: 2017-04-20 | Disposition: A | Discharge status: Home / Self Care | Payer: Medicaid Other | Attending: Emergency Medicine | Admitting: Emergency Medicine

## 2017-04-20 DIAGNOSIS — Z79899 Other long term (current) drug therapy: Secondary | ICD-10-CM | POA: Insufficient documentation

## 2017-04-20 DIAGNOSIS — W19XXXA Unspecified fall, initial encounter: Secondary | ICD-10-CM | POA: Insufficient documentation

## 2017-04-20 DIAGNOSIS — J45909 Unspecified asthma, uncomplicated: Secondary | ICD-10-CM | POA: Diagnosis not present

## 2017-04-20 DIAGNOSIS — Z87891 Personal history of nicotine dependence: Secondary | ICD-10-CM | POA: Insufficient documentation

## 2017-04-20 DIAGNOSIS — Y999 Unspecified external cause status: Secondary | ICD-10-CM | POA: Diagnosis not present

## 2017-04-20 DIAGNOSIS — R350 Frequency of micturition: Secondary | ICD-10-CM | POA: Diagnosis not present

## 2017-04-20 DIAGNOSIS — S6991XA Unspecified injury of right wrist, hand and finger(s), initial encounter: Secondary | ICD-10-CM | POA: Diagnosis present

## 2017-04-20 DIAGNOSIS — Y939 Activity, unspecified: Secondary | ICD-10-CM | POA: Insufficient documentation

## 2017-04-20 DIAGNOSIS — Y929 Unspecified place or not applicable: Secondary | ICD-10-CM | POA: Diagnosis not present

## 2017-04-20 DIAGNOSIS — S60221A Contusion of right hand, initial encounter: Secondary | ICD-10-CM | POA: Insufficient documentation

## 2017-04-20 LAB — URINALYSIS, COMPLETE (UACMP) WITH MICROSCOPIC
BILIRUBIN URINE: NEGATIVE
GLUCOSE, UA: NEGATIVE mg/dL
Hgb urine dipstick: NEGATIVE
Ketones, ur: NEGATIVE mg/dL
NITRITE: NEGATIVE
PROTEIN: NEGATIVE mg/dL
Specific Gravity, Urine: 1.01 (ref 1.005–1.030)
pH: 7 (ref 5.0–8.0)

## 2017-04-20 LAB — POCT PREGNANCY, URINE: Preg Test, Ur: NEGATIVE

## 2017-04-20 MED ORDER — ETODOLAC 500 MG PO TABS: 500.0000 mg | ORAL_TABLET | Freq: Two times a day (BID) | ORAL | 0 refills | Status: DC

## 2017-04-20 NOTE — ED Triage Notes (Signed)
Pt slipped last night and when she tried to catch herself she hurt her right hand. Pt c/o pain and swelling in the right hand.

## 2017-04-20 NOTE — ED Provider Notes (Signed)
[facility] EMERGENCY DEPARTMENT Provider Note   CSN: [id number] Arrival date & time: 04/20/17  1349     History   Chief Complaint Chief Complaint  Patient presents with  . Hand Pain    HPI Kylie [name] is a 27 y.o. female presents to the emergency department for evaluation of right hand patient states earlier today she suffered a mechanical fall, hit her fourth and fifth metacarpal against a hard object while falling.  She is having pain and swelling to the ulnar aspect of the right hand.  She denies any limited range of motion but has pain with range of motion.  She is been taken Tylenol and ibuprofen with minimal to no improvement.  Her pain is moderate.  She denies any other injury throughout her body.  She is having some increase in urinary frequency with no urinary burning or pain.  She denies any back pain, fevers, vomiting.  No abdominal pain chest pain or shortness of breath.  HPI  Past Medical History:  Diagnosis Date  . Asthma   . Irritable bowel syndrome     There are no active problems to display for this patient.   History reviewed. No pertinent surgical history.  OB History    No data available       Home Medications    Prior to Admission medications   Medication Sig Start Date End Date Taking? Authorizing Provider  citalopram (CELEXA) 10 MG tablet Take 10 mg by mouth daily.    [provider]  etodolac (LODINE) 500 MG tablet Take 1 tablet (500 mg total) by mouth 2 (two) times daily. 04/20/17   Evon Slack, PA-C  HYDROcodone-acetaminophen (NORCO/VICODIN) 5-325 MG tablet Take 1 tablet by mouth every 4 (four) hours as needed for moderate pain. 06/03/16   Tommi Rumps, PA-C  promethazine (PHENERGAN) 12.5 MG tablet Take 1 tablet (12.5 mg total) by mouth every 6 (six) hours as needed for nausea or vomiting. 08/15/16   Willy Eddy, MD    Family History No family history on file.  Social History Social History    Tobacco Use  . Smoking status: Former Smoker    Packs/day: 0.50    Types: Cigarettes    Last attempt to quit: 04/09/2016    Years since quitting: 1.0  . Smokeless tobacco: Never Used  Substance Use Topics  . Alcohol use: No  . Drug use: No     Allergies   Patient has no known allergies.   Review of Systems Review of Systems  Constitutional: Negative for fever.  Respiratory: Negative for shortness of breath.   Cardiovascular: Negative for chest pain.  Gastrointestinal: Negative for abdominal distention.  Genitourinary: Positive for frequency. Negative for difficulty urinating, dysuria, flank pain and pelvic pain.  Musculoskeletal: Positive for arthralgias and joint swelling. Negative for neck pain and neck stiffness.  Skin: Negative for wound.  Neurological: Negative for dizziness, weakness and headaches.     Physical Exam Updated Vital Signs BP 129/71 (BP Location: Left Arm)   Pulse 82   Temp 98.6 F (37 C) (Oral)   Resp 20   Wt 61.2 kg (135 lb)   SpO2 99%   BMI 19.94 kg/m   Physical Exam  Constitutional: She is oriented to person, place, and time. She appears well-developed and well-nourished.  HENT:  Head: Normocephalic and atraumatic.  Neck: Normal range of motion.  Cardiovascular: Normal rate.  Pulmonary/Chest: Effort normal. No respiratory distress.  Abdominal: Soft. She exhibits  no distension. There is no tenderness. There is no guarding.  Musculoskeletal: Normal range of motion. She exhibits tenderness. She exhibits no edema.  Examination of the right hand shows patient has no significant swelling.  No skin breakdown noted.  She is tender along the fourth and fifth metacarpal shaft with no limited range of motion with flexion or extension.  She is able to maintain full active extension of the digits.  She is neurovascular intact.  Neurological: She is alert and oriented to person, place, and time.  Skin: Skin is warm. No rash noted.  Psychiatric: She  has a normal mood and affect. Her behavior is normal. Thought content normal.     ED Treatments / Results  Labs (all labs ordered are listed, but only abnormal results are displayed) Labs Reviewed  URINALYSIS, COMPLETE (UACMP) WITH MICROSCOPIC - Abnormal; Notable for the following components:      Result Value   Color, Urine YELLOW (*)    APPearance HAZY (*)    Leukocytes, UA SMALL (*)    Bacteria, UA RARE (*)    Squamous Epithelial / LPF 0-5 (*)    All other components within normal limits  URINE CULTURE  POCT PREGNANCY, URINE  POC URINE PREG, ED    EKG  EKG Interpretation None       Radiology Dg Hand Complete Right  Result Date: 04/20/2017 CLINICAL DATA:  Status post fall last night onto the right hand. Pain. Initial encounter. EXAM: RIGHT HAND - COMPLETE 3+ VIEW COMPARISON:  None. FINDINGS: There is no evidence of fracture or dislocation. There is no evidence of arthropathy or other focal bone abnormality. Soft tissues are unremarkable. IMPRESSION: Negative exam. Electronically Signed   By: Drusilla Kanner M.D.   On: 04/20/2017 15:43   SPLINT APPLICATION Date/Time: 4:21 PM Authorized by: Patience Musca Consent: Verbal consent obtained. Risks and benefits: risks, benefits and alternatives were discussed Consent given by: patient Splint applied by: *ED orthopedic technician Location details: Right wrist Splint type: Prefabricated Velcro wrist brace Supplies used: Velcro wrist brace Post-procedure: The splinted body part was neurovascularly unchanged following the procedure. Patient tolerance: Patient tolerated the procedure well with no immediate complications.     Procedures Procedures (including critical care time)  Medications Ordered in ED Medications - No data to display   Initial Impression / Assessment and Plan / ED Course  I have reviewed the triage vital signs and the nursing notes.  Pertinent labs & imaging results that were available  during my care of the patient were reviewed by me and considered in my medical decision making (see chart for details).     27 year old female with right hand contusion.  X-rays ordered and reviewed by me today show no evidence of acute bony abnormality along the area of point tenderness on the fourth and fifth metacarpal.  No tendon deficits noted.  She is placed into a Velcro wrist brace today in the emergency department.  She will wear as needed for comfort.  Encouraged patient to take anti-inflammatory medication and Tylenol as needed.  She is given a note for work today.  As far as increase in urinary frequency without dysuria urinalysis locate acute UTI.  Will order urine culture.  Patient is educated on signs and symptoms return to ED for.  Final Clinical Impressions(s) / ED Diagnoses   Final diagnoses:  Contusion of right hand, initial encounter  Urinary frequency    ED Discharge Orders        Ordered  etodolac (LODINE) 500 MG tablet  2 times daily     04/20/17 1612       Ronnette JuniperGaines, Zyrion Coey C, PA-C 04/20/17 1622    Arnaldo NatalMalinda, Paul F, MD 04/30/17 2351

## 2017-04-20 NOTE — Discharge Instructions (Signed)
Please wear splint, rest and ice right hand.  Take etodolac twice daily as needed with food.  You may also take Tylenol for additional pain relief.  Follow-up with primary care provider or orthopedist in 1 week if no improvement.

## 2017-04-20 NOTE — ED Notes (Signed)
See triage note   Presents s/p fall  States she fell last pm  Tried to catch herself  Landed on right hand  having increased pain and swelling to hand

## 2017-04-23 ENCOUNTER — Emergency Department
Admission: EM | Admit: 2017-04-23 | Discharge: 2017-04-23 | Disposition: A | Discharge status: Home / Self Care | Payer: Medicaid Other | Attending: Emergency Medicine | Admitting: Emergency Medicine

## 2017-04-23 ENCOUNTER — Encounter: Payer: Self-pay | Admitting: Emergency Medicine

## 2017-04-23 DIAGNOSIS — Z87891 Personal history of nicotine dependence: Secondary | ICD-10-CM | POA: Insufficient documentation

## 2017-04-23 DIAGNOSIS — R300 Dysuria: Secondary | ICD-10-CM | POA: Diagnosis present

## 2017-04-23 DIAGNOSIS — J45909 Unspecified asthma, uncomplicated: Secondary | ICD-10-CM | POA: Insufficient documentation

## 2017-04-23 DIAGNOSIS — Z79899 Other long term (current) drug therapy: Secondary | ICD-10-CM | POA: Insufficient documentation

## 2017-04-23 DIAGNOSIS — N390 Urinary tract infection, site not specified: Secondary | ICD-10-CM | POA: Diagnosis not present

## 2017-04-23 LAB — BASIC METABOLIC PANEL
ANION GAP: 9 (ref 5–15)
BUN: 11 mg/dL (ref 6–20)
CO2: 25 mmol/L (ref 22–32)
CREATININE: 0.64 mg/dL (ref 0.44–1.00)
Calcium: 8.8 mg/dL — ABNORMAL LOW (ref 8.9–10.3)
Chloride: 104 mmol/L (ref 101–111)
GFR calc Af Amer: >60 mL/min (ref >60)
GLUCOSE: 98 mg/dL (ref 65–99)
Potassium: 3.6 mmol/L (ref 3.5–5.1)
Sodium: 138 mmol/L (ref 135–145)

## 2017-04-23 LAB — CBC WITH DIFFERENTIAL/PLATELET
BASOS ABS: 0 10*3/uL (ref 0–0.1)
Basophils Relative: 1 %
EOS PCT: 1 %
Eosinophils Absolute: 0 10*3/uL (ref 0–0.7)
HCT: 36 % (ref 35.0–47.0)
Hemoglobin: 12.2 g/dL (ref 12.0–16.0)
LYMPHS PCT: 47 %
Lymphs Abs: 1.6 10*3/uL (ref 1.0–3.6)
MCH: 29.2 pg (ref 26.0–34.0)
MCHC: 33.9 g/dL (ref 32.0–36.0)
MCV: 86.2 fL (ref 80.0–100.0)
MONO ABS: 0.7 10*3/uL (ref 0.2–0.9)
MONOS PCT: 19 %
Neutro Abs: 1.1 10*3/uL — ABNORMAL LOW (ref 1.4–6.5)
Neutrophils Relative %: 32 %
PLATELETS: 122 10*3/uL — AB (ref 150–440)
RBC: 4.18 MIL/uL (ref 3.80–5.20)
RDW: 13.8 % (ref 11.5–14.5)
WBC: 3.4 10*3/uL — ABNORMAL LOW (ref 3.6–11.0)

## 2017-04-23 LAB — URINALYSIS, COMPLETE (UACMP) WITH MICROSCOPIC
Bilirubin Urine: NEGATIVE
GLUCOSE, UA: NEGATIVE mg/dL
HGB URINE DIPSTICK: NEGATIVE
Ketones, ur: NEGATIVE mg/dL
NITRITE: NEGATIVE
PROTEIN: NEGATIVE mg/dL
Specific Gravity, Urine: 1.006 (ref 1.005–1.030)
pH: 7 (ref 5.0–8.0)

## 2017-04-23 LAB — URINE CULTURE

## 2017-04-23 LAB — POCT PREGNANCY, URINE: Preg Test, Ur: NEGATIVE

## 2017-04-23 MED ORDER — SULFAMETHOXAZOLE-TRIMETHOPRIM 800-160 MG PO TABS
1.0000 | ORAL_TABLET | Freq: Once | ORAL | Status: AC
  Administered 2017-04-23: 1 via ORAL
  Filled 2017-04-23: qty 1

## 2017-04-23 MED ORDER — PHENAZOPYRIDINE HCL 200 MG PO TABS
200.0000 mg | ORAL_TABLET | Freq: Once | ORAL | Status: AC
  Administered 2017-04-23: 200 mg via ORAL
  Filled 2017-04-23: qty 1

## 2017-04-23 MED ORDER — KETOROLAC TROMETHAMINE 60 MG/2ML IM SOLN
60.0000 mg | Freq: Once | INTRAMUSCULAR | Status: AC
  Administered 2017-04-23: 60 mg via INTRAMUSCULAR
  Filled 2017-04-23: qty 2

## 2017-04-23 MED ORDER — PHENAZOPYRIDINE HCL 200 MG PO TABS: 200.0000 mg | ORAL_TABLET | Freq: Three times a day (TID) | ORAL | 0 refills | Status: AC | PRN

## 2017-04-23 NOTE — ED Provider Notes (Signed)
[facility] Emergency Department Provider Note  Time seen: 11:14 AM  I have reviewed the triage vital signs and the nursing notes.   HISTORY  Chief Complaint Dysuria    HPI Kylie [name] is a 27 y.o. female with a past medical history of asthma, IBS, presents to the emergency department with dysuria.  According to the Kylie for the past 5 days she has been experiencing significant pressure when she urinates.  States the pain worsened today now 8 or 9 out of 10.  States she has missed 5 days of work because of this pressure.  Went to the emergency department 3 days ago for hand pain, also mentioned her dysuria at that time.  She was told that her urine looked clean but a urine culture would be sent.  Kylie denies any fever.  States significant dysuria which she describes as a pressure sensation.  States occasional nausea but denies any vomiting.  Denies diarrhea.  Denies vaginal bleeding or discharge.  Past Medical History:  Diagnosis Date  . Asthma   . Irritable bowel syndrome     There are no active problems to display for this Kylie.   History reviewed. No pertinent surgical history.  Prior to Admission medications   Medication Sig Start Date End Date Taking? Authorizing Provider  citalopram (CELEXA) 10 MG tablet Take 10 mg by mouth daily.    [provider]  etodolac (LODINE) 500 MG tablet Take 1 tablet (500 mg total) by mouth 2 (two) times daily. 04/20/17   Evon SlackGaines, Thomas C, PA-C  HYDROcodone-acetaminophen (NORCO/VICODIN) 5-325 MG tablet Take 1 tablet by mouth every 4 (four) hours as needed for moderate pain. 06/03/16   Tommi RumpsSummers, Rhonda L, PA-C  promethazine (PHENERGAN) 12.5 MG tablet Take 1 tablet (12.5 mg total) by mouth every 6 (six) hours as needed for nausea or vomiting. 08/15/16   Willy Eddyobinson, Patrick, MD    No Known Allergies  History reviewed. No pertinent family history.  Social History Social History   Tobacco Use  . Smoking  status: Former Smoker    Packs/day: 0.50    Types: Cigarettes    Last attempt to quit: 04/09/2016    Years since quitting: 1.0  . Smokeless tobacco: Never Used  Substance Use Topics  . Alcohol use: No  . Drug use: Yes    Types: Marijuana    Review of Systems Constitutional: Negative for fever. Eyes: Negative for visual complaints ENT: Negative for recent illness/congestion Cardiovascular: Negative for chest pain. Respiratory: Negative for shortness of breath. Gastrointestinal: Lower abdominal pain/pressure.  Positive for nausea.  Negative for vomiting or diarrhea Genitourinary: Positive for dysuria/pressure when urinating. Musculoskeletal: Right hand pain Skin: Negative for skin complaints  Neurological: Negative for headache All other ROS negative  ____________________________________________   PHYSICAL EXAM:  VITAL SIGNS: ED Triage Vitals  Enc Vitals Group     BP 04/23/17 0928 118/63     Pulse Rate 04/23/17 0928 (!) 103     Resp 04/23/17 0928 18     Temp 04/23/17 0928 97.9 F (36.6 C)     Temp Source 04/23/17 0928 Oral     SpO2 04/23/17 0928 100 %     Weight 04/23/17 0929 168 lb (76.2 kg)     Height 04/23/17 0929 5\' 10"  (1.778 m)     Head Circumference --      Peak Flow --      Pain Score 04/23/17 0928 9     Pain Loc --  Pain Edu? --      Excl. in GC? --    Constitutional: Alert and oriented. Well appearing and in no distress. Eyes: Normal exam ENT   Head: Normocephalic and atraumatic   Mouth/Throat: Mucous membranes are moist. Cardiovascular: Normal rate, regular rhythm. No murmur Respiratory: Normal respiratory effort without tachypnea nor retractions. Breath sounds are clear Gastrointestinal: Soft, mild to moderate suprapubic tenderness to palpation.  Abdomen otherwise benign.  No rebound or guarding.  No distention. Musculoskeletal: Good range of motion in all extremities.  No extremity edema. Neurologic:  Normal speech and language. No gross  focal neurologic deficits Skin:  Skin is warm, dry and intact.  Psychiatric: Mood and affect are normal.   ____________________________________________   INITIAL IMPRESSION / ASSESSMENT AND PLAN / ED COURSE  Pertinent labs & imaging results that were available during my care of the Kylie were reviewed by me and considered in my medical decision making (see chart for details).  Kylie presents to the emergency department for lower abdominal pain/pressure as well as dysuria for the past 5 days.  Kylie was seen in the emergency department 3 days ago for hand pain.  States hand pain has improved but she continues to have urinary symptoms.  Differential would include urinary tract infection, pelvic infection, STD.  Denies any vaginal symptoms such as discharge or bleeding.  In reviewing the Kylie's records she did not fact have a urine culture sent 3 days ago which grew out greater than 100,000 colonies of E. coli which is pansensitive.  Kylie states she also saw her primary care doctor yesterday who prescribed her antibiotics for her urinary tract infection, Bactrim twice daily for 3 days but she has not started taking the medication yet.  We will dose the Kylie her first dose of Bactrim which her urinary infection appears to be sensitive to.  Discussed with the Kylie the need to take her prescription as prescribed by her doctor.  We will also add Pyridium and dose Toradol in the emergency department.  Kylie agreeable to this plan of care.  Urinalysis today shows 6-30 white blood cells.  Given the recent positive culture with white blood cells and today's urinalysis we will treat, Kylie [name] has antibiotics prescribed to her which she filled yesterday but is not yet started.  We will dose her first dose of antibiotic in the emergency department.  Discharged with Pyridium and PCP follow-up.  ____________________________________________   FINAL CLINICAL IMPRESSION(S) / ED  DIAGNOSES  Urinary tract infection    Minna Antis, MD 04/23/17 1142

## 2017-04-23 NOTE — ED Notes (Signed)
Pt states "when me and my boyfriend had sex last night it made me bleed a little and then stopped"

## 2017-04-23 NOTE — ED Notes (Signed)
Patient given two cups of water to provide urine sample

## 2017-04-23 NOTE — ED Triage Notes (Signed)
Pt to ED with c/o of dysuria. Pt states she has not uriated since yesterday.

## 2018-04-19 ENCOUNTER — Emergency Department
Admission: EM | Admit: 2018-04-19 | Discharge: 2018-04-19 | Disposition: A | Discharge status: Home / Self Care | Payer: Medicaid Other | Attending: Student in an Organized Health Care Education/Training Program | Admitting: Student in an Organized Health Care Education/Training Program

## 2018-04-19 ENCOUNTER — Encounter: Payer: Self-pay | Admitting: Emergency Medicine

## 2018-04-19 DIAGNOSIS — R112 Nausea with vomiting, unspecified: Secondary | ICD-10-CM | POA: Insufficient documentation

## 2018-04-19 DIAGNOSIS — R197 Diarrhea, unspecified: Secondary | ICD-10-CM | POA: Insufficient documentation

## 2018-04-19 DIAGNOSIS — Z79899 Other long term (current) drug therapy: Secondary | ICD-10-CM | POA: Insufficient documentation

## 2018-04-19 DIAGNOSIS — Z87891 Personal history of nicotine dependence: Secondary | ICD-10-CM | POA: Insufficient documentation

## 2018-04-19 DIAGNOSIS — J45909 Unspecified asthma, uncomplicated: Secondary | ICD-10-CM | POA: Insufficient documentation

## 2018-04-19 LAB — URINALYSIS, COMPLETE (UACMP) WITH MICROSCOPIC
Bilirubin Urine: NEGATIVE
Glucose, UA: NEGATIVE mg/dL
HGB URINE DIPSTICK: NEGATIVE
Ketones, ur: 5 mg/dL — AB
Nitrite: NEGATIVE
Protein, ur: NEGATIVE mg/dL
Specific Gravity, Urine: 1.014 (ref 1.005–1.030)
pH: 6 (ref 5.0–8.0)

## 2018-04-19 LAB — LIPASE, BLOOD: Lipase: 22 U/L (ref 11–51)

## 2018-04-19 LAB — CBC WITH DIFFERENTIAL/PLATELET
Abs Immature Granulocytes: 0 10*3/uL (ref 0.00–0.07)
BASOS PCT: 0 %
Basophils Absolute: 0 10*3/uL (ref 0.0–0.1)
Eosinophils Absolute: 0 10*3/uL (ref 0.0–0.5)
Eosinophils Relative: 1 %
HCT: 36.9 % (ref 36.0–46.0)
Hemoglobin: 12.8 g/dL (ref 12.0–15.0)
Immature Granulocytes: 0 %
Lymphocytes Relative: 24 %
Lymphs Abs: 0.7 10*3/uL (ref 0.7–4.0)
MCH: 30.9 pg (ref 26.0–34.0)
MCHC: 34.7 g/dL (ref 30.0–36.0)
MCV: 89.1 fL (ref 80.0–100.0)
Monocytes Absolute: 0.4 10*3/uL (ref 0.1–1.0)
Monocytes Relative: 14 %
Neutro Abs: 1.7 10*3/uL (ref 1.7–7.7)
Neutrophils Relative %: 61 %
PLATELETS: 141 10*3/uL — AB (ref 150–400)
RBC: 4.14 MIL/uL (ref 3.87–5.11)
RDW: 12.5 % (ref 11.5–15.5)
WBC: 2.8 10*3/uL — ABNORMAL LOW (ref 4.0–10.5)
nRBC: 0 % (ref 0.0–0.2)

## 2018-04-19 LAB — COMPREHENSIVE METABOLIC PANEL
ALT: 15 U/L (ref 0–44)
AST: 20 U/L (ref 15–41)
Albumin: 4 g/dL (ref 3.5–5.0)
Alkaline Phosphatase: 62 U/L (ref 38–126)
Anion gap: 6 (ref 5–15)
BUN: 14 mg/dL (ref 6–20)
CO2: 27 mmol/L (ref 22–32)
Calcium: 8.7 mg/dL — ABNORMAL LOW (ref 8.9–10.3)
Chloride: 104 mmol/L (ref 98–111)
Creatinine, Ser: 0.57 mg/dL (ref 0.44–1.00)
GFR calc Af Amer: >60 mL/min (ref >60)
GFR calc non Af Amer: >60 mL/min (ref >60)
Glucose, Bld: 120 mg/dL — ABNORMAL HIGH (ref 70–99)
POTASSIUM: 3.3 mmol/L — AB (ref 3.5–5.1)
Sodium: 137 mmol/L (ref 135–145)
Total Bilirubin: 0.8 mg/dL (ref 0.3–1.2)
Total Protein: 7.4 g/dL (ref 6.5–8.1)

## 2018-04-19 LAB — POCT PREGNANCY, URINE: Preg Test, Ur: NEGATIVE

## 2018-04-19 LAB — INFLUENZA PANEL BY PCR (TYPE A & B)
Influenza A By PCR: NEGATIVE
Influenza B By PCR: NEGATIVE

## 2018-04-19 MED ORDER — PROMETHAZINE HCL 12.5 MG PO TABS: 12.5000 mg | ORAL_TABLET | Freq: Four times a day (QID) | ORAL | 0 refills | Status: DC | PRN

## 2018-04-19 MED ORDER — PROMETHAZINE HCL 25 MG/ML IJ SOLN
12.5000 mg | Freq: Four times a day (QID) | INTRAMUSCULAR | Status: DC | PRN
  Filled 2018-04-19: qty 1

## 2018-04-19 MED ORDER — SODIUM CHLORIDE 0.9 % IV BOLUS: 500.0000 mL | Freq: Once | INTRAVENOUS | Status: DC

## 2018-04-19 MED ORDER — PROMETHAZINE HCL 25 MG/ML IJ SOLN
25.0000 mg | Freq: Four times a day (QID) | INTRAMUSCULAR | Status: DC | PRN
  Administered 2018-04-19: 25 mg via INTRAVENOUS

## 2018-04-19 MED ORDER — SODIUM CHLORIDE 0.9 % IV BOLUS
1000.0000 mL | Freq: Once | INTRAVENOUS | Status: AC
  Administered 2018-04-19: 1000 mL via INTRAVENOUS

## 2018-04-19 NOTE — ED Provider Notes (Signed)
[facility] Emergency Department Provider Note    First MD Initiated Contact with Patient 04/19/18 1012     (approximate)  I have reviewed the triage vital signs and the nursing notes.   HISTORY  Chief Complaint Emesis    HPI Kylie [name] is a 28 y.o. female below listed past medical history presents the ER for 24 hours of nausea vomiting diarrhea.  States is nonbloody in nature.  States she had nonbloody emesis.  No melena.  Denies any history of Crohn's or colitis.  No recent antibiotics.  States that symptoms started after she ate boiled eggs and a can of tuna.  States that her symptoms have subsided but she is unable to keep any water down is concerned that she is dehydrated.  Denies any measured fevers.    Past Medical History:  Diagnosis Date  . Asthma   . Irritable bowel syndrome    History reviewed. No pertinent family history. History reviewed. No pertinent surgical history. There are no active problems to display for this patient.     Prior to Admission medications   Medication Sig Start Date End Date Taking? Authorizing Provider  CRANBERRY PO Take by mouth as directed.    [provider]  etodolac (LODINE) 500 MG tablet Take 1 tablet (500 mg total) by mouth 2 (two) times daily. Patient not taking: Reported on 04/23/2017 04/20/17   Evon Slack, PA-C  HYDROcodone-acetaminophen (NORCO/VICODIN) 5-325 MG tablet Take 1 tablet by mouth every 4 (four) hours as needed for moderate pain. Patient not taking: Reported on 04/23/2017 06/03/16   Tommi Rumps, PA-C  phenazopyridine (PYRIDIUM) 200 MG tablet Take 1 tablet (200 mg total) by mouth 3 (three) times daily as needed for pain. 04/23/17 04/23/18  Minna Antis, MD  promethazine (PHENERGAN) 12.5 MG tablet Take 1 tablet (12.5 mg total) by mouth every 6 (six) hours as needed for nausea or vomiting. Patient not taking: Reported on 04/23/2017 08/15/16   Willy Eddy, MD    promethazine (PHENERGAN) 12.5 MG tablet Take 1 tablet (12.5 mg total) by mouth every 6 (six) hours as needed for nausea or vomiting. 04/19/18   Willy Eddy, MD    Allergies Patient has no known allergies.    Social History Social History   Tobacco Use  . Smoking status: Former Smoker    Packs/day: 0.50    Types: Cigarettes    Last attempt to quit: 04/09/2016    Years since quitting: 2.0  . Smokeless tobacco: Never Used  Substance Use Topics  . Alcohol use: No  . Drug use: Yes    Types: Marijuana    Review of Systems Patient denies headaches, rhinorrhea, blurry vision, numbness, shortness of breath, chest pain, edema, cough, abdominal pain, nausea, vomiting, diarrhea, dysuria, fevers, rashes or hallucinations unless otherwise stated above in HPI. ____________________________________________   PHYSICAL EXAM:  VITAL SIGNS: Vitals:   04/19/18 1007 04/19/18 1132  BP: (!) 144/79 (!) 107/45  Pulse: (!) 101 85  Resp: 18   Temp: 98.4 F (36.9 C)   SpO2: 99% 100%    Constitutional: Alert and oriented.  Eyes: Conjunctivae are normal.  Head: Atraumatic. Nose: No congestion/rhinnorhea. Mouth/Throat: Mucous membranes are moist.   Neck: No stridor. Painless ROM.  Cardiovascular: Normal rate, regular rhythm. Grossly normal heart sounds.  Good peripheral circulation. Respiratory: Normal respiratory effort.  No retractions. Lungs CTAB. Gastrointestinal: Soft and nontender. No distention. No abdominal bruits. No CVA tenderness. Genitourinary: Musculoskeletal: No lower extremity tenderness  nor edema.  No joint effusions. Neurologic:  Normal speech and language. No gross focal neurologic deficits are appreciated. No facial droop Skin:  Skin is warm, dry and intact. No rash noted. Psychiatric: Mood and affect are normal. Speech and behavior are normal.  ____________________________________________   LABS (all labs ordered are listed, but only abnormal results are  displayed)  Results for orders placed or performed during the hospital encounter of 04/19/18 (from the past 24 hour(s))  CBC with Differential/Platelet     Status: Abnormal   Collection Time: 04/19/18 10:49 AM  Result Value Ref Range   WBC 2.8 (L) 4.0 - 10.5 K/uL   RBC 4.14 3.87 - 5.11 MIL/uL   Hemoglobin 12.8 12.0 - 15.0 g/dL   HCT 16.136.9 09.636.0 - 04.546.0 %   MCV 89.1 80.0 - 100.0 fL   MCH 30.9 26.0 - 34.0 pg   MCHC 34.7 30.0 - 36.0 g/dL   RDW 40.912.5 81.111.5 - 91.415.5 %   Platelets 141 (L) 150 - 400 K/uL   nRBC 0.0 0.0 - 0.2 %   Neutrophils Relative % 61 %   Neutro Abs 1.7 1.7 - 7.7 K/uL   Lymphocytes Relative 24 %   Lymphs Abs 0.7 0.7 - 4.0 K/uL   Monocytes Relative 14 %   Monocytes Absolute 0.4 0.1 - 1.0 K/uL   Eosinophils Relative 1 %   Eosinophils Absolute 0.0 0.0 - 0.5 K/uL   Basophils Relative 0 %   Basophils Absolute 0.0 0.0 - 0.1 K/uL   Immature Granulocytes 0 %   Abs Immature Granulocytes 0.00 0.00 - 0.07 K/uL  Comprehensive metabolic panel     Status: Abnormal   Collection Time: 04/19/18 10:49 AM  Result Value Ref Range   Sodium 137 135 - 145 mmol/L   Potassium 3.3 (L) 3.5 - 5.1 mmol/L   Chloride 104 98 - 111 mmol/L   CO2 27 22 - 32 mmol/L   Glucose, Bld 120 (H) 70 - 99 mg/dL   BUN 14 6 - 20 mg/dL   Creatinine, Ser 7.820.57 0.44 - 1.00 mg/dL   Calcium 8.7 (L) 8.9 - 10.3 mg/dL   Total Protein 7.4 6.5 - 8.1 g/dL   Albumin 4.0 3.5 - 5.0 g/dL   AST 20 15 - 41 U/L   ALT 15 0 - 44 U/L   Alkaline Phosphatase 62 38 - 126 U/L   Total Bilirubin 0.8 0.3 - 1.2 mg/dL   GFR calc non Af Amer >60 >60 mL/min   GFR calc Af Amer >60 >60 mL/min   Anion gap 6 5 - 15  Lipase, blood     Status: None   Collection Time: 04/19/18 10:49 AM  Result Value Ref Range   Lipase 22 11 - 51 U/L  Urinalysis, Complete w Microscopic     Status: Abnormal   Collection Time: 04/19/18 10:49 AM  Result Value Ref Range   Color, Urine YELLOW (A) YELLOW   APPearance HAZY (A) CLEAR   Specific Gravity, Urine 1.014  1.005 - 1.030   pH 6.0 5.0 - 8.0   Glucose, UA NEGATIVE NEGATIVE mg/dL   Hgb urine dipstick NEGATIVE NEGATIVE   Bilirubin Urine NEGATIVE NEGATIVE   Ketones, ur 5 (A) NEGATIVE mg/dL   Protein, ur NEGATIVE NEGATIVE mg/dL   Nitrite NEGATIVE NEGATIVE   Leukocytes, UA SMALL (A) NEGATIVE   RBC / HPF 0-5 0 - 5 RBC/hpf   WBC, UA 0-5 0 - 5 WBC/hpf   Bacteria, UA RARE (A) NONE SEEN  Squamous Epithelial / LPF 11-20 0 - 5   Mucus PRESENT   Influenza panel by PCR (type A & B)     Status: None   Collection Time: 04/19/18 11:31 AM  Result Value Ref Range   Influenza A By PCR NEGATIVE NEGATIVE   Influenza B By PCR NEGATIVE NEGATIVE  Pregnancy, urine POC     Status: None   Collection Time: 04/19/18  1:13 PM  Result Value Ref Range   Preg Test, Ur NEGATIVE NEGATIVE   ____________________________________________ ____________________________________________  RADIOLOGY   ____________________________________________   PROCEDURES  Procedure(s) performed:  Procedures    Critical Care performed: no ____________________________________________   INITIAL IMPRESSION / ASSESSMENT AND PLAN / ED COURSE  Pertinent labs & imaging results that were available during my care of the patient were reviewed by me and considered in my medical decision making (see chart for details).   DDX: dehydration, enteritis, gastritic, colitis, electrolyte abn  Kylie [name] is a 28 y.o. who presents to the ED with symptoms as described above. Patient is AFVSS in ED. Exam as above. Given current presentation have considered the above differential.  Blood work will be ordered to evaluate for the by differential.  We will give IV fluids.  Will give IV antiemetic and reassess.  Her abdominal exam soft and benign.  Doubt acute intra-abdominal process  Clinical Course as of Apr 19 1398  Sun Apr 19, 2018  1343 Patient with significant improvement in her symptoms.  Requesting discharge and work note.  At this point do  believe she stable and appropriate for discharge home.   [PR]    Clinical Course User Index [PR] Willy Eddyobinson, Jamarco Zaldivar, MD     As part of my medical decision making, I reviewed the following data within the electronic MEDICAL RECORD NUMBER Nursing notes reviewed and incorporated, Labs reviewed, notes from prior ED visits and [place] Controlled Substance Database   ____________________________________________   FINAL CLINICAL IMPRESSION(S) / ED DIAGNOSES  Final diagnoses:  Nausea vomiting and diarrhea      NEW MEDICATIONS STARTED DURING THIS VISIT:  New Prescriptions   PROMETHAZINE (PHENERGAN) 12.5 MG TABLET    Take 1 tablet (12.5 mg total) by mouth every 6 (six) hours as needed for nausea or vomiting.     Note:  This document was prepared using Dragon voice recognition software and may include unintentional dictation errors.    Willy Eddyobinson, Aima Mcwhirt, MD 04/19/18 1401

## 2018-04-19 NOTE — ED Triage Notes (Signed)
FIRST NURSE NOTE-here for vomiting, diarrhea, body aches.  Ambulatory, NAD. Mask applied

## 2018-04-19 NOTE — ED Triage Notes (Signed)
Here for generalized body cramps. Reports has been unable to stop vomiting since yesterday.  Cannot keep anything down. Has also had diarrhea. No abdominal pain.  VSS.

## 2019-01-07 ENCOUNTER — Encounter: Payer: Self-pay | Admitting: Emergency Medicine

## 2019-01-07 ENCOUNTER — Other Ambulatory Visit: Payer: Self-pay

## 2019-01-07 DIAGNOSIS — Z79899 Other long term (current) drug therapy: Secondary | ICD-10-CM | POA: Insufficient documentation

## 2019-01-07 DIAGNOSIS — J45909 Unspecified asthma, uncomplicated: Secondary | ICD-10-CM | POA: Insufficient documentation

## 2019-01-07 DIAGNOSIS — K529 Noninfective gastroenteritis and colitis, unspecified: Secondary | ICD-10-CM | POA: Insufficient documentation

## 2019-01-07 DIAGNOSIS — Z87891 Personal history of nicotine dependence: Secondary | ICD-10-CM | POA: Insufficient documentation

## 2019-01-07 DIAGNOSIS — F1210 Cannabis abuse, uncomplicated: Secondary | ICD-10-CM | POA: Insufficient documentation

## 2019-01-07 LAB — CBC
HCT: 39.6 % (ref 36.0–46.0)
Hemoglobin: 13.8 g/dL (ref 12.0–15.0)
MCH: 31.2 pg (ref 26.0–34.0)
MCHC: 34.8 g/dL (ref 30.0–36.0)
MCV: 89.6 fL (ref 80.0–100.0)
Platelets: 267 10*3/uL (ref 150–400)
RBC: 4.42 MIL/uL (ref 3.87–5.11)
RDW: 12.2 % (ref 11.5–15.5)
WBC: 8.2 10*3/uL (ref 4.0–10.5)
nRBC: 0 % (ref 0.0–0.2)

## 2019-01-07 LAB — URINALYSIS, COMPLETE (UACMP) WITH MICROSCOPIC
Bilirubin Urine: NEGATIVE
Glucose, UA: NEGATIVE mg/dL
Hgb urine dipstick: NEGATIVE
Ketones, ur: NEGATIVE mg/dL
Leukocytes,Ua: NEGATIVE
Nitrite: NEGATIVE
Protein, ur: NEGATIVE mg/dL
Specific Gravity, Urine: 1.011 (ref 1.005–1.030)
pH: 7 (ref 5.0–8.0)

## 2019-01-07 LAB — COMPREHENSIVE METABOLIC PANEL
ALT: 15 U/L (ref 0–44)
AST: 20 U/L (ref 15–41)
Albumin: 4.4 g/dL (ref 3.5–5.0)
Alkaline Phosphatase: 82 U/L (ref 38–126)
Anion gap: 6 (ref 5–15)
BUN: 10 mg/dL (ref 6–20)
CO2: 25 mmol/L (ref 22–32)
Calcium: 9.2 mg/dL (ref 8.9–10.3)
Chloride: 105 mmol/L (ref 98–111)
Creatinine, Ser: 0.53 mg/dL (ref 0.44–1.00)
GFR calc Af Amer: >60 mL/min (ref >60)
GFR calc non Af Amer: >60 mL/min (ref >60)
Glucose, Bld: 96 mg/dL (ref 70–99)
Potassium: 3.6 mmol/L (ref 3.5–5.1)
Sodium: 136 mmol/L (ref 135–145)
Total Bilirubin: 0.7 mg/dL (ref 0.3–1.2)
Total Protein: 8.2 g/dL — ABNORMAL HIGH (ref 6.5–8.1)

## 2019-01-07 LAB — POCT PREGNANCY, URINE: Preg Test, Ur: NEGATIVE

## 2019-01-07 LAB — LIPASE, BLOOD: Lipase: 17 U/L (ref 11–51)

## 2019-01-07 NOTE — ED Triage Notes (Signed)
Pt in via POV, complaints of generalized abdominal pain w/ N/V/D since last night.  Vitals WDL, NAD noted at this time.

## 2019-01-08 ENCOUNTER — Emergency Department
Admission: EM | Admit: 2019-01-08 | Discharge: 2019-01-08 | Disposition: A | Discharge status: Home / Self Care | Payer: Self-pay | Attending: Emergency Medicine | Admitting: Emergency Medicine

## 2019-01-08 DIAGNOSIS — K529 Noninfective gastroenteritis and colitis, unspecified: Secondary | ICD-10-CM

## 2019-01-08 MED ORDER — ONDANSETRON 4 MG PO TBDP: ORAL_TABLET | ORAL | 0 refills | Status: DC

## 2019-01-08 MED ORDER — ONDANSETRON 4 MG PO TBDP
8.0000 mg | ORAL_TABLET | Freq: Once | ORAL | Status: AC
  Administered 2019-01-08: 02:00:00 8 mg via ORAL
  Filled 2019-01-08: qty 2

## 2019-01-08 NOTE — Discharge Instructions (Signed)

## 2019-01-08 NOTE — ED Notes (Signed)
PT given gingerale and encouraged to take small sips when she feels ready

## 2019-01-08 NOTE — ED Provider Notes (Addendum)
[facility] Emergency Department Provider Note  ____________________________________________   First MD Initiated Contact with Patient 01/08/19 0131     (approximate)  I have reviewed the triage vital signs and the nursing notes.   HISTORY  Chief Complaint Emesis and Diarrhea    HPI Kylie [name] is a 28 y.o. female with medical history as listed below which notably includes irritable bowel syndrome who presents for evaluation of about 24 hours of persistent nausea, vomiting, and diarrhea.  She says that the symptoms started acutely and were immediately severe about 24 hours ago with simultaneous vomiting and diarrhea.  She says that the stools initially were not watery but have become watery over time.  She has had at least 7 or 8 bowel movements over the course of the day.  She says that she last vomited about an hour prior to arrival which was about 4 hours ago.  She  said that nothing in particular makes his symptoms better or worse and she has not been able to keep much down.  This last happened to her about 9 or 10 months ago when she came to the ED and felt better after some fluids and IV antiemetics.  She has not been around COVID-19 patients.  She denies fever/chills, sore throat, chest pain, shortness of breath.  She is having some mild intermittent generalized abdominal cramping associated with the vomiting and diarrhea.        Past Medical History:  Diagnosis Date  . Asthma   . Irritable bowel syndrome     There are no active problems to display for this patient.   History reviewed. No pertinent surgical history.  Prior to Admission medications   Medication Sig Start Date End Date Taking? Authorizing Provider  CRANBERRY PO Take by mouth as directed.    [provider]  etodolac (LODINE) 500 MG tablet Take 1 tablet (500 mg total) by mouth 2 (two) times daily. Patient not taking: Reported on 04/23/2017 04/20/17   Duanne Guess, PA-C   HYDROcodone-acetaminophen (NORCO/VICODIN) 5-325 MG tablet Take 1 tablet by mouth every 4 (four) hours as needed for moderate pain. Patient not taking: Reported on 04/23/2017 06/03/16   Johnn Hai, PA-C  ondansetron (ZOFRAN ODT) 4 MG disintegrating tablet Allow 1-2 tablets to dissolve in your mouth every 8 hours as needed for nausea/vomiting 01/08/19   Hinda Kehr, MD  promethazine (PHENERGAN) 12.5 MG tablet Take 1 tablet (12.5 mg total) by mouth every 6 (six) hours as needed for nausea or vomiting. Patient not taking: Reported on 04/23/2017 08/15/16   Merlyn Lot, MD  promethazine (PHENERGAN) 12.5 MG tablet Take 1 tablet (12.5 mg total) by mouth every 6 (six) hours as needed for nausea or vomiting. 04/19/18   Merlyn Lot, MD    Allergies Patient has no known allergies.  No family history on file.  Social History Social History   Tobacco Use  . Smoking status: Former Smoker    Packs/day: 0.50    Types: Cigarettes    Quit date: 04/09/2016    Years since quitting: 2.7  . Smokeless tobacco: Never Used  Substance Use Topics  . Alcohol use: No  . Drug use: Yes    Types: Marijuana    Review of Systems Constitutional: No fever/chills Eyes: No visual changes. ENT: No sore throat. Cardiovascular: Denies chest pain. Respiratory: Denies shortness of breath. Gastrointestinal: Acute onset nausea, vomiting, and diarrhea with some generalized abdominal cramping as described above. Genitourinary: Negative for  dysuria. Musculoskeletal: Negative for neck pain.  Negative for back pain. Integumentary: Negative for rash. Neurological: Negative for headaches, focal weakness or numbness.   ____________________________________________   PHYSICAL EXAM:  VITAL SIGNS: ED Triage Vitals [01/07/19 2244]  Enc Vitals Group     BP 126/86     Pulse Rate 72     Resp 16     Temp 97.7 F (36.5 C)     Temp Source Oral     SpO2 99 %     Weight 88.5 kg (195 lb)     Height 1.778 m  (5\' 10" )     Head Circumference      Peak Flow      Pain Score 7     Pain Loc      Pain Edu?      Excl. in GC?     Constitutional: Alert and oriented.  Appears like she does not feel well but does not appear toxic and is in no acute distress at this time. Eyes: Conjunctivae are normal.  Head: Atraumatic. Nose: No congestion/rhinnorhea. Mouth/Throat: Mucous membranes are moist. Neck: No stridor.  No meningeal signs.   Cardiovascular: Normal rate, regular rhythm. Good peripheral circulation. Grossly normal heart sounds. Respiratory: Normal respiratory effort.  No retractions. Gastrointestinal: Soft and nontender. No distention.  Musculoskeletal: No lower extremity tenderness nor edema. No gross deformities of extremities. Neurologic:  Normal speech and language. No gross focal neurologic deficits are appreciated.  Skin:  Skin is warm, dry and intact. Psychiatric: Mood and affect are normal. Speech and behavior are normal.  ____________________________________________   LABS (all labs ordered are listed, but only abnormal results are displayed)  Labs Reviewed  COMPREHENSIVE METABOLIC PANEL - Abnormal; Notable for the following components:      Result Value   Total Protein 8.2 (*)    All other components within normal limits  URINALYSIS, COMPLETE (UACMP) WITH MICROSCOPIC - Abnormal; Notable for the following components:   Color, Urine YELLOW (*)    APPearance CLEAR (*)    Bacteria, UA RARE (*)    All other components within normal limits  LIPASE, BLOOD  CBC  POC URINE PREG, ED  POCT PREGNANCY, URINE   ____________________________________________  EKG  None - EKG not ordered by ED physician ____________________________________________  RADIOLOGY Marylou MccoyI, Jadrian Bulman, personally viewed and evaluated these images (plain radiographs) as part of my medical decision making, as well as reviewing the written report by the radiologist.  ED MD interpretation: No indication for  emergent imaging  Official radiology report(s): No results found.  ____________________________________________   PROCEDURES   Procedure(s) performed (including Critical Care):  Procedures   ____________________________________________   INITIAL IMPRESSION / MDM / ASSESSMENT AND PLAN / ED COURSE  As part of my medical decision making, I reviewed the following data within the electronic MEDICAL RECORD NUMBER Nursing notes reviewed and incorporated, Labs reviewed , Old chart reviewed and Notes from prior ED visits   Differential diagnosis includes, but is not limited to, viral gastroenteritis, bacterial infection, pre-existing foodborne pathogen, electrolyte or metabolic abnormality, acute kidney injury.  In spite of the patient's symptoms her lab work is all within normal limits including normal kidney function and electrolytes.  She has no ketones or sign of infection in her urine.  She is feeling better than she did earlier.  I offered her an IV with a liter of normal saline and IV Zofran but she prefers to try some oral Zofran and a p.o. challenge.  I  will give her a prescription for Zofran and encouraged plenty of rehydration and a bland diet for the next couple of days.  I gave my usual customary return precautions.      Clinical Course as of Jan 07 237  Fri Jan 08, 2019  0237 Tolerated PO intake, will d/c   [CF]    Clinical Course User Index [CF] Loleta Rose, MD     ____________________________________________  FINAL CLINICAL IMPRESSION(S) / ED DIAGNOSES  Final diagnoses:  Gastroenteritis     MEDICATIONS GIVEN DURING THIS VISIT:  Medications  ondansetron (ZOFRAN-ODT) disintegrating tablet 8 mg (8 mg Oral Given 01/08/19 0144)     ED Discharge Orders         Ordered    ondansetron (ZOFRAN ODT) 4 MG disintegrating tablet     01/08/19 0141          *Please note:  Kylie [name] was evaluated in Emergency Department on 01/08/2019 for the symptoms described  in the history of present illness. She was evaluated in the context of the global COVID-19 pandemic, which necessitated consideration that the patient might be at risk for infection with the SARS-CoV-2 virus that causes COVID-19. Institutional protocols and algorithms that pertain to the evaluation of patients at risk for COVID-19 are in a state of rapid change based on information released by regulatory bodies including the CDC and federal and state organizations. These policies and algorithms were followed during the patient's care in the ED.  Some ED evaluations and interventions may be delayed as a result of limited staffing during the pandemic.*  Note:  This document was prepared using Dragon voice recognition software and may include unintentional dictation errors.   Loleta Rose, MD 01/08/19 Gretel Acre    Loleta Rose, MD 01/08/19 [phone]

## 2019-02-24 ENCOUNTER — Encounter: Payer: Self-pay | Admitting: Emergency Medicine

## 2019-02-24 ENCOUNTER — Other Ambulatory Visit: Payer: Self-pay

## 2019-02-24 DIAGNOSIS — R102 Pelvic and perineal pain: Secondary | ICD-10-CM | POA: Insufficient documentation

## 2019-02-24 DIAGNOSIS — N390 Urinary tract infection, site not specified: Secondary | ICD-10-CM | POA: Insufficient documentation

## 2019-02-24 DIAGNOSIS — Z87891 Personal history of nicotine dependence: Secondary | ICD-10-CM | POA: Insufficient documentation

## 2019-02-24 DIAGNOSIS — J45909 Unspecified asthma, uncomplicated: Secondary | ICD-10-CM | POA: Insufficient documentation

## 2019-02-24 DIAGNOSIS — F1210 Cannabis abuse, uncomplicated: Secondary | ICD-10-CM | POA: Insufficient documentation

## 2019-02-24 NOTE — ED Triage Notes (Signed)
Pt to triage via w/c with no distress noted, mask in place; pt reports left lower abd pain since Monday with no accomp symptoms; st has had irregular periods recently

## 2019-02-25 ENCOUNTER — Emergency Department: Payer: Self-pay

## 2019-02-25 ENCOUNTER — Emergency Department
Admission: EM | Admit: 2019-02-25 | Discharge: 2019-02-25 | Disposition: A | Discharge status: Home / Self Care | Payer: Self-pay | Attending: Emergency Medicine | Admitting: Emergency Medicine

## 2019-02-25 DIAGNOSIS — N390 Urinary tract infection, site not specified: Secondary | ICD-10-CM

## 2019-02-25 DIAGNOSIS — R102 Pelvic and perineal pain: Secondary | ICD-10-CM

## 2019-02-25 LAB — URINALYSIS, COMPLETE (UACMP) WITH MICROSCOPIC
Bilirubin Urine: NEGATIVE
Glucose, UA: NEGATIVE mg/dL
Ketones, ur: NEGATIVE mg/dL
Nitrite: POSITIVE — AB
Protein, ur: 30 mg/dL — AB
RBC / HPF: >50 RBC/hpf — ABNORMAL HIGH (ref 0–5)
Specific Gravity, Urine: 1.018 (ref 1.005–1.030)
WBC, UA: >50 WBC/hpf — ABNORMAL HIGH (ref 0–5)
pH: 5 (ref 5.0–8.0)

## 2019-02-25 LAB — CBC WITH DIFFERENTIAL/PLATELET
Abs Immature Granulocytes: 0.02 10*3/uL (ref 0.00–0.07)
Basophils Absolute: 0.1 10*3/uL (ref 0.0–0.1)
Basophils Relative: 1 %
Eosinophils Absolute: 0.1 10*3/uL (ref 0.0–0.5)
Eosinophils Relative: 1 %
HCT: 39 % (ref 36.0–46.0)
Hemoglobin: 13.9 g/dL (ref 12.0–15.0)
Immature Granulocytes: 0 %
Lymphocytes Relative: 34 %
Lymphs Abs: 2.4 10*3/uL (ref 0.7–4.0)
MCH: 30.6 pg (ref 26.0–34.0)
MCHC: 35.6 g/dL (ref 30.0–36.0)
MCV: 85.9 fL (ref 80.0–100.0)
Monocytes Absolute: 0.6 10*3/uL (ref 0.1–1.0)
Monocytes Relative: 8 %
Neutro Abs: 4 10*3/uL (ref 1.7–7.7)
Neutrophils Relative %: 56 %
Platelets: 220 10*3/uL (ref 150–400)
RBC: 4.54 MIL/uL (ref 3.87–5.11)
RDW: 12.4 % (ref 11.5–15.5)
WBC: 7.2 10*3/uL (ref 4.0–10.5)
nRBC: 0 % (ref 0.0–0.2)

## 2019-02-25 LAB — COMPREHENSIVE METABOLIC PANEL
ALT: 20 U/L (ref 0–44)
AST: 17 U/L (ref 15–41)
Albumin: 4.2 g/dL (ref 3.5–5.0)
Alkaline Phosphatase: 78 U/L (ref 38–126)
Anion gap: 7 (ref 5–15)
BUN: 11 mg/dL (ref 6–20)
CO2: 27 mmol/L (ref 22–32)
Calcium: 9 mg/dL (ref 8.9–10.3)
Chloride: 104 mmol/L (ref 98–111)
Creatinine, Ser: 0.62 mg/dL (ref 0.44–1.00)
GFR calc Af Amer: >60 mL/min (ref >60)
GFR calc non Af Amer: >60 mL/min (ref >60)
Glucose, Bld: 102 mg/dL — ABNORMAL HIGH (ref 70–99)
Potassium: 3.9 mmol/L (ref 3.5–5.1)
Sodium: 138 mmol/L (ref 135–145)
Total Bilirubin: 0.6 mg/dL (ref 0.3–1.2)
Total Protein: 7.7 g/dL (ref 6.5–8.1)

## 2019-02-25 LAB — POCT PREGNANCY, URINE: Preg Test, Ur: NEGATIVE

## 2019-02-25 LAB — HCG, QUANTITATIVE, PREGNANCY: hCG, Beta Chain, Quant, S: <1 m[IU]/mL (ref <5)

## 2019-02-25 LAB — LIPASE, BLOOD: Lipase: 26 U/L (ref 11–51)

## 2019-02-25 MED ORDER — SODIUM CHLORIDE 0.9 % IV SOLN
1.0000 g | Freq: Once | INTRAVENOUS | Status: AC
  Administered 2019-02-25: 08:00:00 1 g via INTRAVENOUS
  Filled 2019-02-25: qty 10

## 2019-02-25 MED ORDER — CEPHALEXIN 500 MG PO CAPS: 500.0000 mg | ORAL_CAPSULE | Freq: Four times a day (QID) | ORAL | 0 refills | Status: AC

## 2019-02-25 MED ORDER — SODIUM CHLORIDE 0.9 % IV BOLUS
1000.0000 mL | Freq: Once | INTRAVENOUS | Status: AC
  Administered 2019-02-25: 04:00:00 1000 mL via INTRAVENOUS

## 2019-02-25 MED ORDER — PHENAZOPYRIDINE HCL 200 MG PO TABS: 200.0000 mg | ORAL_TABLET | Freq: Three times a day (TID) | ORAL | 0 refills | Status: DC | PRN

## 2019-02-25 NOTE — ED Provider Notes (Signed)
[facility] Emergency Department Provider Note   ____________________________________________   I have reviewed the triage vital signs and the nursing notes.   HISTORY  Chief Complaint Abdominal Pain   History limited by: Not Limited   HPI Kylie [name] is a 28 y.o. female who presents to the emergency department today because of concern for abdominal pain. The pain started 3 days ago. It was initially located across her lower abdomen but has now localized more the the left lower quadrant. The patient states that the first day she had the pain she did have a large amount of vaginal bleeding which is unusual for her. The patient denies similar pain in the past. Did have discomfort with urination that first day but denies any bad odor to her urine. Denies any fevers.   Records reviewed. Per medical record review patient has a history of asthma, IBS.   Past Medical History:  Diagnosis Date  . Asthma   . Irritable bowel syndrome     There are no active problems to display for this patient.   History reviewed. No pertinent surgical history.  Prior to Admission medications   Medication Sig Start Date End Date Taking? Authorizing Provider  CRANBERRY PO Take by mouth as directed.    [provider]  etodolac (LODINE) 500 MG tablet Take 1 tablet (500 mg total) by mouth 2 (two) times daily. Patient not taking: Reported on 04/23/2017 04/20/17   Evon Slack, PA-C  HYDROcodone-acetaminophen (NORCO/VICODIN) 5-325 MG tablet Take 1 tablet by mouth every 4 (four) hours as needed for moderate pain. Patient not taking: Reported on 04/23/2017 06/03/16   Tommi Rumps, PA-C  ondansetron (ZOFRAN ODT) 4 MG disintegrating tablet Allow 1-2 tablets to dissolve in your mouth every 8 hours as needed for nausea/vomiting 01/08/19   Loleta Rose, MD  promethazine (PHENERGAN) 12.5 MG tablet Take 1 tablet (12.5 mg total) by mouth every 6 (six) hours as needed for nausea  or vomiting. Patient not taking: Reported on 04/23/2017 08/15/16   Willy Eddy, MD  promethazine (PHENERGAN) 12.5 MG tablet Take 1 tablet (12.5 mg total) by mouth every 6 (six) hours as needed for nausea or vomiting. 04/19/18   Willy Eddy, MD    Allergies Patient has no known allergies.  No family history on file.  Social History Social History   Tobacco Use  . Smoking status: Former Smoker    Packs/day: 0.50    Types: Cigarettes    Quit date: 04/09/2016    Years since quitting: 2.8  . Smokeless tobacco: Never Used  Substance Use Topics  . Alcohol use: No  . Drug use: Yes    Types: Marijuana    Review of Systems Constitutional: No fever/chills Eyes: No visual changes. ENT: No sore throat. Cardiovascular: Denies chest pain. Respiratory: Denies shortness of breath. Gastrointestinal: Positive for abdominal pain.  Genitourinary: Positive for dysuria. Musculoskeletal: Negative for back pain. Skin: Negative for rash. Neurological: Negative for headaches, focal weakness or numbness.  ____________________________________________   PHYSICAL EXAM:  VITAL SIGNS: ED Triage Vitals  Enc Vitals Group     BP 02/24/19 2329 139/80     Pulse Rate 02/24/19 2329 (!) 107     Resp 02/24/19 2329 18     Temp 02/24/19 2329 98.7 F (37.1 C)     Temp Source 02/24/19 2329 Oral     SpO2 02/24/19 2329 99 %     Weight 02/24/19 2327 195 lb (88.5 kg)  Height 02/24/19 2327 5\' 10"  (1.778 m)     Head Circumference --      Peak Flow --      Pain Score 02/24/19 2327 8   Constitutional: Alert and oriented.  Eyes: Conjunctivae are normal.  ENT      Head: Normocephalic and atraumatic.      Nose: No congestion/rhinnorhea.      Mouth/Throat: Mucous membranes are moist.      Neck: No stridor. Hematological/Lymphatic/Immunilogical: No cervical lymphadenopathy. Cardiovascular: Normal rate, regular rhythm.  No murmurs, rubs, or gallops.  Respiratory: Normal respiratory effort without  tachypnea nor retractions. Breath sounds are clear and equal bilaterally. No wheezes/rales/rhonchi. Gastrointestinal: Soft and tender in the right lower quadrant.  Genitourinary: Deferred Musculoskeletal: Normal range of motion in all extremities. No lower extremity edema. Neurologic:  Normal speech and language. No gross focal neurologic deficits are appreciated.  Skin:  Skin is warm, dry and intact. No rash noted. Psychiatric: Mood and affect are normal. Speech and behavior are normal. Patient exhibits appropriate insight and judgment.  ____________________________________________    LABS (pertinent positives/negatives)  Lipase 26 CBC wbc 7.2, hgb 13.9, plt 220 CMP wnl except glu 102 bhcg <1 Upreg negative UA cloudy, moderate hgb dipstick ____________________________________________   EKG  None  ____________________________________________    RADIOLOGY  US pelvis No acute abnormality. Stable cyst. Multiple follicles in bilateral ovaries.  ____________________________________________   PROCEDURES  Procedures  ____________________________________________   INITIAL IMPRESSION / ASSESSMENT AND PLAN / ED COURSE  Pertinent labs & imaging results that were available during my care of the patient were reviewed by me and considered in my medical decision making (see chart for details).   Patient presented to the emergency department today because of concern for abdominal pain. Worse on the left side. Blood work without leukocytosis. Doubt appendicitis/diverticulitis. Did obtain US which did not show any concerning ovarian pathology. UA was concerning for urinary tract infection. Will give dose of antibiotics in the emergency department. Will discharge with further antibiotics.  ____________________________________________   FINAL CLINICAL IMPRESSION(S) / ED DIAGNOSES  Final diagnoses:  Left adnexal tenderness  Lower urinary tract infectious disease     Note: This  dictation was prepared with Dragon dictation. Any transcriptional errors that result from this process are unintentional     Nance Pear, MD 02/25/19 [phone]

## 2019-02-25 NOTE — Discharge Instructions (Addendum)
Please seek medical attention for any high fevers, chest pain, shortness of breath, change in behavior, persistent vomiting, bloody stool or any other new or concerning symptoms.  

## 2019-02-25 NOTE — ED Notes (Signed)
Patient transported to Ultrasound 

## 2019-02-27 LAB — URINE CULTURE: Culture: >=100000 — AB

## 2019-03-09 ENCOUNTER — Encounter: Payer: Self-pay | Admitting: *Deleted

## 2019-03-09 ENCOUNTER — Other Ambulatory Visit: Payer: Self-pay

## 2019-03-09 DIAGNOSIS — J029 Acute pharyngitis, unspecified: Secondary | ICD-10-CM | POA: Insufficient documentation

## 2019-03-09 DIAGNOSIS — Z79899 Other long term (current) drug therapy: Secondary | ICD-10-CM | POA: Insufficient documentation

## 2019-03-09 DIAGNOSIS — J45909 Unspecified asthma, uncomplicated: Secondary | ICD-10-CM | POA: Insufficient documentation

## 2019-03-09 DIAGNOSIS — Z87891 Personal history of nicotine dependence: Secondary | ICD-10-CM | POA: Insufficient documentation

## 2019-03-09 NOTE — ED Triage Notes (Signed)
Pt to triage via wheelchair. Pt is non verbal and writing notes.  Pt has a sore throat for 3 days.  Pt also has a headache.  Pt alert.

## 2019-03-10 ENCOUNTER — Emergency Department: Payer: Self-pay

## 2019-03-10 ENCOUNTER — Emergency Department
Admission: EM | Admit: 2019-03-10 | Discharge: 2019-03-10 | Disposition: A | Discharge status: Home / Self Care | Payer: Self-pay | Attending: Emergency Medicine | Admitting: Emergency Medicine

## 2019-03-10 DIAGNOSIS — J029 Acute pharyngitis, unspecified: Secondary | ICD-10-CM

## 2019-03-10 LAB — CBC WITH DIFFERENTIAL/PLATELET
Abs Immature Granulocytes: 0.01 10*3/uL (ref 0.00–0.07)
Basophils Absolute: 0 10*3/uL (ref 0.0–0.1)
Basophils Relative: 0 %
Eosinophils Absolute: 0.2 10*3/uL (ref 0.0–0.5)
Eosinophils Relative: 2 %
HCT: 38.9 % (ref 36.0–46.0)
Hemoglobin: 13.4 g/dL (ref 12.0–15.0)
Immature Granulocytes: 0 %
Lymphocytes Relative: 38 %
Lymphs Abs: 2.7 10*3/uL (ref 0.7–4.0)
MCH: 30.9 pg (ref 26.0–34.0)
MCHC: 34.4 g/dL (ref 30.0–36.0)
MCV: 89.6 fL (ref 80.0–100.0)
Monocytes Absolute: 0.6 10*3/uL (ref 0.1–1.0)
Monocytes Relative: 8 %
Neutro Abs: 3.8 10*3/uL (ref 1.7–7.7)
Neutrophils Relative %: 52 %
Platelets: 215 10*3/uL (ref 150–400)
RBC: 4.34 MIL/uL (ref 3.87–5.11)
RDW: 12.5 % (ref 11.5–15.5)
WBC: 7.3 10*3/uL (ref 4.0–10.5)
nRBC: 0 % (ref 0.0–0.2)

## 2019-03-10 LAB — BASIC METABOLIC PANEL WITH GFR
Anion gap: 9 (ref 5–15)
BUN: 16 mg/dL (ref 6–20)
CO2: 26 mmol/L (ref 22–32)
Calcium: 9 mg/dL (ref 8.9–10.3)
Chloride: 100 mmol/L (ref 98–111)
Creatinine, Ser: 0.58 mg/dL (ref 0.44–1.00)
GFR calc Af Amer: >60 mL/min
GFR calc non Af Amer: >60 mL/min
Glucose, Bld: 94 mg/dL (ref 70–99)
Potassium: 4.3 mmol/L (ref 3.5–5.1)
Sodium: 135 mmol/L (ref 135–145)

## 2019-03-10 LAB — GROUP A STREP BY PCR: Group A Strep by PCR: NOT DETECTED

## 2019-03-10 MED ORDER — IOHEXOL 300 MG/ML  SOLN
75.0000 mL | Freq: Once | INTRAMUSCULAR | Status: AC | PRN
  Administered 2019-03-10: 75 mL via INTRAVENOUS

## 2019-03-10 MED ORDER — SODIUM CHLORIDE 0.9 % IV SOLN
3.0000 g | INTRAVENOUS | Status: AC
  Administered 2019-03-10: 3 g via INTRAVENOUS
  Filled 2019-03-10: qty 8

## 2019-03-10 MED ORDER — DEXAMETHASONE SODIUM PHOSPHATE 10 MG/ML IJ SOLN
10.0000 mg | Freq: Once | INTRAMUSCULAR | Status: AC
  Administered 2019-03-10: 10 mg via INTRAVENOUS
  Filled 2019-03-10: qty 1

## 2019-03-10 MED ORDER — MAGIC MOUTHWASH W/LIDOCAINE: 5.0000 mL | Freq: Four times a day (QID) | ORAL | 0 refills | Status: DC | PRN

## 2019-03-10 MED ORDER — AMOXICILLIN-POT CLAVULANATE 875-125 MG PO TABS: 1.0000 | ORAL_TABLET | Freq: Two times a day (BID) | ORAL | 0 refills | Status: AC

## 2019-03-10 MED ORDER — AMOXICILLIN-POT CLAVULANATE 875-125 MG PO TABS: 1.0000 | ORAL_TABLET | Freq: Two times a day (BID) | ORAL | 0 refills | Status: DC

## 2019-03-10 NOTE — Discharge Instructions (Signed)
As we discussed, your lab work and CT scan of your neck and throat were reassuring with no obvious or emergent abnormalities.  Given the pain and swelling you are experiencing you were given a dose of steroids and I have prescribed you antibiotics for the next 7 days.  Please take the full course of treatment.  I also prescribed a medicine that you can use to "swish and spit" and gargle, and this may provide you with some relief from your sore throat.  You may also take over-the-counter ibuprofen and Tylenol according to label instructions for pain and symptom relief.  Please stay hydrated with plenty of fluids even if you do not feel like eating food.  If you were swabbed for COVID-19, please check MyChart over the next couple of days for the results.  In the meantime I recommend that you stay isolated from other people and continue to use your mask in public if you have to leave the house.  Return to the emergency department if you develop new or worsening symptoms that concern you.

## 2019-03-10 NOTE — ED Notes (Addendum)
Pt c/o sore throat x3 days. Denies fever. States she has been unable to eat or drink d/t pain. No white patches noted in throat. Pt in NAD at this time.

## 2019-03-10 NOTE — ED Provider Notes (Signed)
[facility] Emergency Department Provider Note  ____________________________________________   First MD Initiated Contact with Patient 03/10/19 [phone]     (approximate)  I have reviewed the triage vital signs and the nursing notes.   HISTORY  Chief Complaint Sore Throat    HPI Kylie [name] is a 28 y.o. female with no contributory past medical history who presents for evaluation of 3 days of sore throat.  She said that she feels like there is swelling and it is becoming difficult to swallow and speak.  She denies any other symptoms including fever/chills, chest pain, shortness of breath, cough, nausea, vomiting, abdominal pain, generalized body aches, and fatigue.  She says that the sore throat is severe, sharp, and nothing in particular makes it better or worse.  The swelling seems to be in the front of her neck on both sides.  She has some chronic dental issues but has no dental pain at this time and no particular dental issue.  The pain started in her throat rather than starting in her teeth.  She said that the pain is on both sides below her jaw and radiates up particularly on the right side of her face and head.         Past Medical History:  Diagnosis Date  . Asthma   . Irritable bowel syndrome     There are no problems to display for this patient.   No past surgical history on file.  Prior to Admission medications   Medication Sig Start Date End Date Taking? Authorizing Provider  amoxicillin-clavulanate (AUGMENTIN) 875-125 MG tablet Take 1 tablet by mouth every 12 (twelve) hours for 10 days. 03/10/19 03/20/19  Loleta RoseForbach, Keitra Carusone, MD  CRANBERRY PO Take by mouth as directed.    [provider]  etodolac (LODINE) 500 MG tablet Take 1 tablet (500 mg total) by mouth 2 (two) times daily. Patient not taking: Reported on 04/23/2017 04/20/17   Evon SlackGaines, Thomas C, PA-C  HYDROcodone-acetaminophen (NORCO/VICODIN) 5-325 MG tablet Take 1 tablet by mouth every  4 (four) hours as needed for moderate pain. Patient not taking: Reported on 04/23/2017 06/03/16   Tommi RumpsSummers, Rhonda L, PA-C  magic mouthwash w/lidocaine SOLN Take 5 mLs by mouth 4 (four) times daily as needed for mouth pain. Swish and spit, do not swallow the solution. 03/10/19   Loleta RoseForbach, Areli Frary, MD  ondansetron (ZOFRAN ODT) 4 MG disintegrating tablet Allow 1-2 tablets to dissolve in your mouth every 8 hours as needed for nausea/vomiting 01/08/19   Loleta RoseForbach, Daesean Lazarz, MD  phenazopyridine (PYRIDIUM) 200 MG tablet Take 1 tablet (200 mg total) by mouth 3 (three) times daily as needed for pain. 02/25/19 02/25/20  Phineas SemenGoodman, Graydon, MD  promethazine (PHENERGAN) 12.5 MG tablet Take 1 tablet (12.5 mg total) by mouth every 6 (six) hours as needed for nausea or vomiting. Patient not taking: Reported on 04/23/2017 08/15/16   Willy Eddyobinson, Patrick, MD  promethazine (PHENERGAN) 12.5 MG tablet Take 1 tablet (12.5 mg total) by mouth every 6 (six) hours as needed for nausea or vomiting. 04/19/18   Willy Eddyobinson, Patrick, MD    Allergies Patient has no known allergies.  No family history on file.  Social History Social History   Tobacco Use  . Smoking status: Former Smoker    Packs/day: 0.50    Types: Cigarettes    Quit date: 04/09/2016    Years since quitting: 2.9  . Smokeless tobacco: Never Used  Substance Use Topics  . Alcohol use: No  . Drug use:  Yes    Types: Marijuana    Review of Systems Constitutional: No fever/chills Eyes: No visual changes. ENT: Severe sore throat with pain and difficulty swallowing. Cardiovascular: Denies chest pain. Respiratory: Denies shortness of breath. Gastrointestinal: No abdominal pain.  No nausea, no vomiting.  No diarrhea.  No constipation. Genitourinary: Negative for dysuria. Musculoskeletal: Negative for neck pain.  Negative for back pain. Integumentary: Negative for rash. Neurological: Negative for headaches, focal weakness or  numbness.   ____________________________________________   PHYSICAL EXAM:  VITAL SIGNS: ED Triage Vitals  Enc Vitals Group     BP 03/09/19 2241 119/73     Pulse Rate 03/09/19 2241 89     Resp 03/09/19 2241 20     Temp 03/09/19 2241 98.9 F (37.2 C)     Temp Source 03/09/19 2241 Oral     SpO2 03/09/19 2241 100 %     Weight 03/09/19 2247 88.5 kg (195 lb)     Height 03/09/19 2247 1.778 m (5\' 10" )     Head Circumference --      Peak Flow --      Pain Score 03/09/19 2247 8     Pain Loc --      Pain Edu? --      Excl. in [place]? --     Constitutional: Alert and oriented.  Appears uncomfortable and ill but nontoxic. Eyes: Conjunctivae are normal.  Head: Atraumatic. Nose: No congestion/rhinnorhea. Mouth/Throat: Oropharynx is nonerythematous.  Exam is limited due to either the patient's inability to open her mouth (trismus) or unwillingness to do so because it hurts.  Poor visualization of the posterior oropharynx but no clear or suggestion of abscess such as peritonsillar abscess.  No dental tenderness.  There are chronic dental caries but no obvious acute infection or abscess.  Soft and nontender to palpation below the tongue with no swelling. Neck: No stridor.  No meningeal signs.  The patient has a soft but essentially normal voice when encouraged to speak with no obvious dysphonia.  However she does have obvious swelling externally submandibularly and bilaterally with tenderness to palpation and manipulation throughout the anterior neck as well as to the larynx.  She is tolerating lying supine and is tolerating her secretions. Cardiovascular: Normal rate, regular rhythm. Good peripheral circulation. Grossly normal heart sounds. Respiratory: Normal respiratory effort.  No retractions. Gastrointestinal: Soft and nontender. No distention.  Musculoskeletal: No lower extremity tenderness nor edema. No gross deformities of extremities. Neurologic:  Normal speech and language. No gross focal  neurologic deficits are appreciated.  Skin:  Skin is warm, dry and intact.   ____________________________________________   LABS (all labs ordered are listed, but only abnormal results are displayed)  Labs Reviewed  GROUP A STREP BY PCR  NOVEL CORONAVIRUS, NAA (HOSP ORDER, SEND-OUT TO REF LAB; TAT 18-24 HRS)  CBC WITH DIFFERENTIAL/PLATELET  BASIC METABOLIC PANEL   ____________________________________________  EKG  No indication for EKG ____________________________________________  RADIOLOGY Ursula Alert, personally viewed and evaluated these images (plain radiographs) as part of my medical decision making, as well as reviewing the written report by the radiologist.  ED MD interpretation:  No acute abnormalities identified on CT neck.  Official radiology report(s): CT Soft Tissue Neck W Contrast  Result Date: 03/10/2019 CLINICAL DATA:  Initial evaluation for acute sore throat for 3 days. EXAM: CT NECK WITH CONTRAST TECHNIQUE: Multidetector CT imaging of the neck was performed using the standard protocol following the bolus administration of intravenous contrast. CONTRAST:  77mL OMNIPAQUE IOHEXOL  300 MG/ML  SOLN COMPARISON:  None available. FINDINGS: Pharynx and larynx: Oral cavity within normal limits without discrete mass or collection. Few scattered dental caries and periapical lucencies noted about the dentition without associated inflammatory changes. Palatine tonsils symmetric and within normal limits bilaterally. No tonsillar or peritonsillar abscess. Parapharyngeal fat maintained. Remainder of the oropharynx and nasopharynx within normal limits. No retropharyngeal collection. Epiglottis normal. Vallecula clear. Remainder of the hypopharynx and supraglottic larynx within normal limits. True cords symmetric and within normal limits. Subglottic airway clear. Salivary glands: Salivary glands including the parotid and submandibular glands within normal limits. Thyroid: Tiny  subcentimeter hypodense right thyroid nodule noted, which is not meet imaging criteria for further evaluation with dedicated thyroid ultrasound. Thyroid otherwise unremarkable. Lymph nodes: No pathologically enlarged lymph nodes identified within the neck. Vascular: Normal intravascular enhancement seen throughout the neck. Limited intracranial: Unremarkable. Visualized orbits: Unremarkable. Mastoids and visualized paranasal sinuses: Visualized paranasal sinuses are largely clear. Visualize mastoids and middle ear cavities are well pneumatized and free of fluid. Skeleton: No acute osseous abnormality. No discrete lytic or blastic osseous lesions. Upper chest: Visualized upper chest demonstrates no acute finding. Other: None. IMPRESSION: 1. Normal CT of the neck. No acute inflammatory changes or other abnormality identified. 2. Poor dentition without acute inflammatory changes. Electronically Signed   By: Rise Mu M.D.   On: 03/10/2019 02:49    ____________________________________________   PROCEDURES   Procedure(s) performed (including Critical Care):  Procedures   ____________________________________________   INITIAL IMPRESSION / MDM / ASSESSMENT AND PLAN / ED COURSE  As part of my medical decision making, I reviewed the following data within the electronic MEDICAL RECORD NUMBER History obtained from family, Nursing notes reviewed and incorporated, Labs reviewed , Old chart reviewed, Notes from prior ED visits and [place] Controlled Substance Database   Differential diagnosis includes, but is not limited to, epiglottitis, tonsillitis/pharyngitis, retropharyngeal abscess, peritonsillar abscess, Ludwig's angina, COVID-19.  The patient has obvious anterior bilateral neck swelling which is very tender and concerning for a deep neck space infection.  I think COVID-19 is less likely and she has no other systemic symptoms, just the sore throat and swelling.  She believes she was fully vaccinated  as a child.  Airway is not in immediate danger but I think it is appropriate to further evaluate with a CT scan given the probability of a deep neck space infection.  I am ordering Decadron 10 mg IV, rapid strep swab, and I will also begin treatment with Unasyn 3 g IV given the probability of infection requiring antibiotic treatment based on physical exam.      Clinical Course as of Mar 09 322  Wed Mar 10, 2019  0132 Normal, no leukocytosis  CBC with Differential [CF]  0143 Normal BMP  Basic metabolic panel [CF]  0206 Group A Strep by PCR: NOT DETECTED [CF]  0312 The patient is sleeping comfortably but awoke when I talked to her.  I updated her about the reassuring results.  Given the swelling that I can appreciate on physical exam although it is not present on the CT, I will treat her with Augmentin.  She does not need another dose of steroids as the Decadron will work for a couple of days.  I will also prescribe some Magic mouthwash.  I gave her the option of getting a COVID-19 outpatient test before she leaves but she fell back asleep before she could decide.  I will put in the order and the nurses can  discuss it with her before she goes.  I gave my usual and customary return precautions.   [CF]    Clinical Course User Index [CF] Loleta Rose, MD     ____________________________________________  FINAL CLINICAL IMPRESSION(S) / ED DIAGNOSES  Final diagnoses:  Sore throat     MEDICATIONS GIVEN DURING THIS VISIT:  Medications  dexamethasone (DECADRON) injection 10 mg (10 mg Intravenous Given 03/10/19 0121)  Ampicillin-Sulbactam (UNASYN) 3 g in sodium chloride 0.9 % 100 mL IVPB (0 g Intravenous Stopped 03/10/19 0317)  iohexol (OMNIPAQUE) 300 MG/ML solution 75 mL (75 mLs Intravenous Contrast Given 03/10/19 0200)     ED Discharge Orders         Ordered    amoxicillin-clavulanate (AUGMENTIN) 875-125 MG tablet  Every 12 hours,   Status:  Discontinued     03/10/19 0316    magic  mouthwash w/lidocaine SOLN  4 times daily PRN    Note to Pharmacy: Please mix viscous lidocaine 2% with magic mouthwash solution so that the lidocaine comprises approximately 25 % of the total solution.   03/10/19 0316    amoxicillin-clavulanate (AUGMENTIN) 875-125 MG tablet  Every 12 hours     03/10/19 0316          *Please note:  Kylie [name] was evaluated in Emergency Department on 03/10/2019 for the symptoms described in the history of present illness. She was evaluated in the context of the global COVID-19 pandemic, which necessitated consideration that the patient might be at risk for infection with the SARS-CoV-2 virus that causes COVID-19. Institutional protocols and algorithms that pertain to the evaluation of patients at risk for COVID-19 are in a state of rapid change based on information released by regulatory bodies including the CDC and federal and state organizations. These policies and algorithms were followed during the patient's care in the ED.  Some ED evaluations and interventions may be delayed as a result of limited staffing during the pandemic.*  Note:  This document was prepared using Dragon voice recognition software and may include unintentional dictation errors.   Loleta Rose, MD 03/10/19 [phone]

## 2019-03-10 NOTE — ED Notes (Signed)
Pt transported to to CT 

## 2019-03-10 NOTE — ED Notes (Signed)
MD at bedside. 

## 2019-03-10 NOTE — ED Notes (Signed)
Pt refused to be tested for COVID

## 2019-03-16 ENCOUNTER — Other Ambulatory Visit: Payer: Self-pay

## 2019-03-16 ENCOUNTER — Emergency Department: Payer: Self-pay

## 2019-03-16 ENCOUNTER — Emergency Department
Admission: EM | Admit: 2019-03-16 | Discharge: 2019-03-16 | Disposition: A | Discharge status: Home / Self Care | Payer: Self-pay | Attending: Emergency Medicine | Admitting: Emergency Medicine

## 2019-03-16 DIAGNOSIS — W010XXA Fall on same level from slipping, tripping and stumbling without subsequent striking against object, initial encounter: Secondary | ICD-10-CM | POA: Insufficient documentation

## 2019-03-16 DIAGNOSIS — Y998 Other external cause status: Secondary | ICD-10-CM | POA: Insufficient documentation

## 2019-03-16 DIAGNOSIS — S93401A Sprain of unspecified ligament of right ankle, initial encounter: Secondary | ICD-10-CM | POA: Insufficient documentation

## 2019-03-16 DIAGNOSIS — Z87891 Personal history of nicotine dependence: Secondary | ICD-10-CM | POA: Insufficient documentation

## 2019-03-16 DIAGNOSIS — Y9389 Activity, other specified: Secondary | ICD-10-CM | POA: Insufficient documentation

## 2019-03-16 DIAGNOSIS — F1210 Cannabis abuse, uncomplicated: Secondary | ICD-10-CM | POA: Insufficient documentation

## 2019-03-16 DIAGNOSIS — Y929 Unspecified place or not applicable: Secondary | ICD-10-CM | POA: Insufficient documentation

## 2019-03-16 DIAGNOSIS — J45909 Unspecified asthma, uncomplicated: Secondary | ICD-10-CM | POA: Insufficient documentation

## 2019-03-16 MED ORDER — IBUPROFEN 600 MG PO TABS: 600.0000 mg | ORAL_TABLET | Freq: Three times a day (TID) | ORAL | 0 refills | Status: DC | PRN

## 2019-03-16 MED ORDER — TRAMADOL HCL 50 MG PO TABS: 50.0000 mg | ORAL_TABLET | Freq: Four times a day (QID) | ORAL | 0 refills | Status: DC | PRN

## 2019-03-16 MED ORDER — OXYCODONE-ACETAMINOPHEN 5-325 MG PO TABS
1.0000 | ORAL_TABLET | Freq: Once | ORAL | Status: AC
  Administered 2019-03-16: 1 via ORAL
  Filled 2019-03-16: qty 1

## 2019-03-16 MED ORDER — IBUPROFEN 600 MG PO TABS
600.0000 mg | ORAL_TABLET | Freq: Once | ORAL | Status: AC
  Administered 2019-03-16: 600 mg via ORAL
  Filled 2019-03-16: qty 1
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
## 2019-03-16 NOTE — Discharge Instructions (Addendum)
Follow discharge care instructions.  Wear ankle splint for 3 to 5 days as needed.  Take medication as directed.

## 2019-07-28 DIAGNOSIS — F418 Other specified anxiety disorders: Secondary | ICD-10-CM

## 2019-07-28 DIAGNOSIS — N644 Mastodynia: Secondary | ICD-10-CM

## 2019-07-29 ENCOUNTER — Other Ambulatory Visit: Payer: Self-pay

## 2019-07-29 ENCOUNTER — Ambulatory Visit: Payer: Self-pay | Admitting: Family Medicine

## 2019-07-29 ENCOUNTER — Encounter: Payer: Self-pay | Admitting: Family Medicine

## 2019-07-29 DIAGNOSIS — Z202 Contact with and (suspected) exposure to infections with a predominantly sexual mode of transmission: Secondary | ICD-10-CM

## 2019-07-29 DIAGNOSIS — Z113 Encounter for screening for infections with a predominantly sexual mode of transmission: Secondary | ICD-10-CM

## 2019-07-29 LAB — WET PREP FOR TRICH, YEAST, CLUE
Trichomonas Exam: NEGATIVE
Yeast Exam: NEGATIVE

## 2019-07-29 LAB — PREGNANCY, URINE: Preg Test, Ur: NEGATIVE

## 2019-07-29 MED ORDER — AZITHROMYCIN 500 MG PO TABS
1000.0000 mg | ORAL_TABLET | Freq: Once | ORAL | Status: AC
  Administered 2019-07-29: 1000 mg via ORAL

## 2019-07-29 MED ORDER — CEFTRIAXONE SODIUM 250 MG IJ SOLR
500.0000 mg | Freq: Once | INTRAMUSCULAR | Status: AC
  Administered 2019-07-29: 500 mg via INTRAMUSCULAR

## 2019-07-29 NOTE — Progress Notes (Signed)
[facility] Department STI clinic/screening visit  Subjective:  Kylie [name] is a 29 y.o. female being seen today for  Chief Complaint  Patient presents with  . SEXUALLY TRANSMITTED DISEASE    STD screening including bloodwork     The patient reports they do have symptoms. Patient reports that they do not desire a pregnancy in the next year. They reported they are not interested in discussing contraception today.   Patient has the following medical conditions:   Patient Active Problem List   Diagnosis Date Noted  . 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 Breast pain, bilateral 07/15/2016    HPI  Pt reports she is here for gonorrhea treatment, is a contact. Endorses lower abd pain x10 days. She is 1 wk late for menses.   See flowsheet for further details and programmatic requirements.    No LMP recorded. Last sex: around 07/20/2019 BCM: none Desires EC? n/a   No components found for: HCV  The following portions of the patient's history were reviewed and updated as appropriate: allergies, current medications, past medical history, past social history, past surgical history and problem list.  Objective:  There were no vitals filed for this visit.   Physical Exam Vitals and nursing note reviewed.  Constitutional:      Appearance: Normal appearance.  HENT:     Head: Normocephalic and atraumatic.     Mouth/Throat:     Mouth: Mucous membranes are moist.     Pharynx: Oropharynx is clear. No oropharyngeal exudate or posterior oropharyngeal erythema.  Pulmonary:     Effort: Pulmonary effort is normal.  Abdominal:     General: Abdomen is flat.     Palpations: There is no mass.     Tenderness: There is no abdominal tenderness. There is no rebound.  Genitourinary:    General: Normal vulva.     Exam position: Lithotomy position.     Pubic Area: No rash or pubic lice.      Labia:        Right: No rash or lesion.        Left: No rash or lesion.    Vagina: Vaginal discharge (yellow, ph>4,5) present. No erythema, bleeding or lesions.     Cervix: No cervical motion tenderness, discharge, friability, lesion or erythema.     Uterus: Normal.      Adnexa: Right adnexa normal and left adnexa normal.     Rectum: Normal.  Lymphadenopathy:     Head:     Right side of head: No preauricular or posterior auricular adenopathy.     Left side of head: No preauricular or posterior auricular adenopathy.     Cervical: No cervical adenopathy.     Upper Body:     Right upper body: No supraclavicular or axillary adenopathy.     Left upper body: No supraclavicular or axillary adenopathy.     Lower Body: No right inguinal adenopathy. No left inguinal adenopathy.  Skin:    General: Skin is warm and dry.     Findings: No rash.  Neurological:     Mental Status: She is alert and oriented to person, place, and time.      Assessment and Plan:  LORIENE TAUNTON is a 29 y.o. female presenting to the [facility] Department for STI screening    1. Screening examination for venereal disease -Pt with symptoms. Screenings today as below. Treat wet prep per standing order. -Patient does meet criteria for HepB, HepC Screening.  Declines these screenings. -Counseled on warning s/sx and when to seek care. Recommended condom use with all sex and discussed importance of condom use for STI prevention. - WET PREP FOR TRICH, YEAST, CLUE - Chlamydia/Gonorrhea [facility] - HIV [facility] - Syphilis [facility]ology, [facility]  2. Gonorrhea contact -Pt is a contact to gonorrhea, unsure if chlamydia test was done/positive/negative. As she is at risk of pregnancy will treat as below: -Pt counseled regarding medication. No known allergies to these medications.  -Advised no sex for 7 days after both pt and partner completes treatment and encouraged condoms with all sex. - Pregnancy, urine - azithromycin (ZITHROMAX) tablet 1,000 mg - cefTRIAXone (ROCEPHIN)  injection 500 mg   Return if symptoms worsen or fail to improve.  No future appointments.  Ann Held, PA-C

## 2019-07-29 NOTE — Progress Notes (Signed)
Provider reviewed UPT and wet mount results. Per verbal order by Samara Snide, PA, pt treated with 500 mg of Ceftriaxone IM and 1 gram of Azithromycin PO x 1 DOT for contact to [facility]. Provider orders completed.

## 2019-08-06 ENCOUNTER — Telehealth: Payer: Self-pay

## 2019-08-06 DIAGNOSIS — A549 Gonococcal infection, unspecified: Secondary | ICD-10-CM

## 2019-08-09 NOTE — Telephone Encounter (Signed)
TC to patient. Verified ID via password/SS#. Informed of positive GC. Patient and partner have already been treated. Instructed TOC in 3 months. Richmond Campbell, RN

## 2019-11-15 ENCOUNTER — Other Ambulatory Visit: Payer: Self-pay

## 2019-11-15 ENCOUNTER — Ambulatory Visit (LOCAL_COMMUNITY_HEALTH_CENTER): Payer: Self-pay

## 2019-11-15 VITALS — BP 118/79 | Ht 70.0 in | Wt 189.0 lb

## 2019-11-15 DIAGNOSIS — Z3201 Encounter for pregnancy test, result positive: Secondary | ICD-10-CM

## 2019-11-15 LAB — PREGNANCY, URINE: Preg Test, Ur: POSITIVE — AB

## 2019-11-15 MED ORDER — PRENATAL VITAMIN 27-0.8 MG PO TABS: 1.0000 | ORAL_TABLET | Freq: Every day | ORAL | 0 refills | Status: DC

## 2019-11-15 NOTE — Progress Notes (Signed)
Plans PNC at [facility]. Carey Lafon, RN  

## 2019-11-18 ENCOUNTER — Emergency Department
Admission: EM | Admit: 2019-11-18 | Discharge: 2019-11-18 | Disposition: A | Discharge status: Home / Self Care | Payer: Self-pay | Attending: Emergency Medicine | Admitting: Emergency Medicine

## 2019-11-18 ENCOUNTER — Other Ambulatory Visit: Payer: Self-pay

## 2019-11-18 DIAGNOSIS — O99891 Other specified diseases and conditions complicating pregnancy: Secondary | ICD-10-CM | POA: Insufficient documentation

## 2019-11-18 DIAGNOSIS — M79602 Pain in left arm: Secondary | ICD-10-CM | POA: Insufficient documentation

## 2019-11-18 DIAGNOSIS — M542 Cervicalgia: Secondary | ICD-10-CM | POA: Insufficient documentation

## 2019-11-18 DIAGNOSIS — Z5321 Procedure and treatment not carried out due to patient leaving prior to being seen by health care provider: Secondary | ICD-10-CM | POA: Insufficient documentation

## 2019-11-18 DIAGNOSIS — Z3A08 8 weeks gestation of pregnancy: Secondary | ICD-10-CM | POA: Insufficient documentation

## 2019-11-18 NOTE — ED Notes (Signed)
No answer when called several times from lobby 

## 2019-11-18 NOTE — ED Triage Notes (Addendum)
Pt in with co left neck pain x 2 days radiates down left shoulder. Painful to move head from side to side and painful to lift left arm. Also tender to touch to left neck and left clavicular area. Pt is approx [redacted] weeks pregnant, denies any pregnancy concerns.

## 2019-12-30 ENCOUNTER — Emergency Department
Admission: EM | Admit: 2019-12-30 | Discharge: 2019-12-30 | Disposition: A | Discharge status: Home / Self Care | Payer: Self-pay | Attending: Emergency Medicine | Admitting: Emergency Medicine

## 2019-12-30 ENCOUNTER — Emergency Department: Payer: Self-pay

## 2019-12-30 ENCOUNTER — Other Ambulatory Visit: Payer: Self-pay

## 2019-12-30 DIAGNOSIS — F17210 Nicotine dependence, cigarettes, uncomplicated: Secondary | ICD-10-CM | POA: Insufficient documentation

## 2019-12-30 DIAGNOSIS — O210 Mild hyperemesis gravidarum: Secondary | ICD-10-CM | POA: Insufficient documentation

## 2019-12-30 DIAGNOSIS — R102 Pelvic and perineal pain: Secondary | ICD-10-CM

## 2019-12-30 DIAGNOSIS — Z3A12 12 weeks gestation of pregnancy: Secondary | ICD-10-CM | POA: Insufficient documentation

## 2019-12-30 LAB — COMPREHENSIVE METABOLIC PANEL
ALT: 19 U/L (ref 0–44)
AST: 17 U/L (ref 15–41)
Albumin: 4 g/dL (ref 3.5–5.0)
Alkaline Phosphatase: 67 U/L (ref 38–126)
Anion gap: 12 (ref 5–15)
BUN: 11 mg/dL (ref 6–20)
CO2: 20 mmol/L — ABNORMAL LOW (ref 22–32)
Calcium: 9.2 mg/dL (ref 8.9–10.3)
Chloride: 103 mmol/L (ref 98–111)
Creatinine, Ser: 0.34 mg/dL — ABNORMAL LOW (ref 0.44–1.00)
GFR calc non Af Amer: >60 mL/min (ref >60)
Glucose, Bld: 81 mg/dL (ref 70–99)
Potassium: 3.4 mmol/L — ABNORMAL LOW (ref 3.5–5.1)
Sodium: 135 mmol/L (ref 135–145)
Total Bilirubin: 0.9 mg/dL (ref 0.3–1.2)
Total Protein: 8.4 g/dL — ABNORMAL HIGH (ref 6.5–8.1)

## 2019-12-30 LAB — URINALYSIS, COMPLETE (UACMP) WITH MICROSCOPIC
Bilirubin Urine: NEGATIVE
Glucose, UA: NEGATIVE mg/dL
Hgb urine dipstick: NEGATIVE
Ketones, ur: 80 mg/dL — AB
Nitrite: NEGATIVE
Protein, ur: 100 mg/dL — AB
Specific Gravity, Urine: 1.031 — ABNORMAL HIGH (ref 1.005–1.030)
pH: 5 (ref 5.0–8.0)

## 2019-12-30 LAB — CBC
HCT: 37.4 % (ref 36.0–46.0)
Hemoglobin: 13.7 g/dL (ref 12.0–15.0)
MCH: 31.5 pg (ref 26.0–34.0)
MCHC: 36.6 g/dL — ABNORMAL HIGH (ref 30.0–36.0)
MCV: 86 fL (ref 80.0–100.0)
Platelets: 207 10*3/uL (ref 150–400)
RBC: 4.35 MIL/uL (ref 3.87–5.11)
RDW: 12.3 % (ref 11.5–15.5)
WBC: 8 10*3/uL (ref 4.0–10.5)
nRBC: 0 % (ref 0.0–0.2)

## 2019-12-30 LAB — HCG, QUANTITATIVE, PREGNANCY: hCG, Beta Chain, Quant, S: 86610 m[IU]/mL — ABNORMAL HIGH (ref <5)

## 2019-12-30 LAB — LIPASE, BLOOD: Lipase: 24 U/L (ref 11–51)

## 2019-12-30 MED ORDER — ONDANSETRON 4 MG PO TBDP: 4.0000 mg | ORAL_TABLET | Freq: Three times a day (TID) | ORAL | 0 refills | Status: DC | PRN

## 2019-12-30 MED ORDER — ONDANSETRON HCL 4 MG/2ML IJ SOLN
4.0000 mg | Freq: Once | INTRAMUSCULAR | Status: AC
  Administered 2019-12-30: 4 mg via INTRAVENOUS

## 2019-12-30 MED ORDER — PRENATAL PLUS 27-1 MG PO TABS
1.0000 | ORAL_TABLET | Freq: Every day | ORAL | 2 refills | Status: AC
Start: 2019-12-30 — End: 2020-03-29

## 2019-12-30 MED ORDER — SODIUM CHLORIDE 0.9 % IV BOLUS
1000.0000 mL | Freq: Once | INTRAVENOUS | Status: AC
  Administered 2019-12-30: 1000 mL via INTRAVENOUS

## 2019-12-30 MED ORDER — ONDANSETRON HCL 4 MG/2ML IJ SOLN
INTRAMUSCULAR | Status: AC
  Filled 2019-12-30: qty 2

## 2019-12-30 NOTE — ED Triage Notes (Signed)
Pt to the er via ems for vomiting. Pt has been vomiting since midnight. Pt is [redacted] weeks pregnant. Pt denies any strange foods. Pt has soreness in abd from vomiting but no pain.

## 2019-12-30 NOTE — ED Notes (Signed)
Pt vomiting in lobby, reports has not been given anything for nausea.  Requested nausea meds from MD

## 2019-12-30 NOTE — ED Notes (Signed)
Pt states that she's been throwing up every hour since last night. Pt states she's had similar issues when she was pregnant with her daughter. Pt states she hasn't peed since last night and that she thinks she's dehydrated.

## 2019-12-30 NOTE — ED Provider Notes (Signed)
[facility] Emergency Department Provider Note ____________________________________________  Time seen: 1915  I have reviewed the triage vital signs and the nursing notes.  HISTORY  Chief Complaint  Emesis  HPI Kylie [name] is a 29 y.o. G3P2 female at [redacted] weeks  gestation, presents to the ED via EMS, for evaluation of emesis.  Patient describes onset of nonbloody, nonbilious emesis and since midnight.  She denies any new food exposures, sick contact, recent travel, or other concerns.  She reports only some mild abdominal soreness from the vomiting but denies any frank abdominal pain.  She also denies any abnormal vaginal bleeding, discharge, pelvic pain, or flank pain. The patient has not established prenatal care at this time.   Past Medical History:  Diagnosis Date   Anemia    History of gonorrhea    treated 07/2019 at [facility]   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 Authorizing Provider  ondansetron (ZOFRAN ODT) 4 MG disintegrating tablet Take 1 tablet (4 mg total) by mouth every 8 (eight) hours as needed. 12/30/19   Byrne Capek, Charlesetta Ivory, PA-C  prenatal vitamin w/FE, FA (PRENATAL 1 + 1) 27-1 MG TABS tablet Take 1 tablet by mouth daily at 12 noon. 12/30/19 03/29/20  Yunior Jain, Charlesetta Ivory, PA-C    Allergies Patient has no known allergies.  Family History  Problem Relation Age of Onset   Hypertension Mother    Depression Father    Diabetes Father    Hypertension Father    Bipolar disorder Father    Breast cancer Paternal Grandmother    Depression Half-Sister      Social History Social History   Tobacco Use   Smoking status: Current Some Day Smoker    Years: 12.00    Types: Cigarettes   Smokeless tobacco: Never Used  Building services engineer Use: Never used  Substance Use Topics   Alcohol use: Yes    Comment: Last ETOH use 10/2019 (unaware pregnant)   Drug use: Yes    Frequency: 14.0 times per week    Types: Marijuana    Comment: 11/15/2019 - 3 - 4 blunts QD per client.    Review of Systems  Constitutional: Negative for fever. Eyes: Negative for visual changes. ENT: Negative for sore throat. Cardiovascular: Negative for chest pain. Respiratory: Negative for shortness of breath. Gastrointestinal: Negative for abdominal pain, and diarrhea.  Reports vomiting as above. Genitourinary: Negative for dysuria.  Denies vaginal bleeding. Musculoskeletal: Negative for back pain. Skin: Negative for rash. Neurological: Negative for headaches, focal weakness or numbness. ____________________________________________  PHYSICAL EXAM:  VITAL SIGNS: ED Triage Vitals  Enc Vitals Group     BP 12/30/19 1756 122/70     Pulse Rate 12/30/19 1756 96     Resp 12/30/19 1756 18     Temp 12/30/19 1756 98.5 F (36.9 C)     Temp Source 12/30/19 1756 Oral     SpO2 12/30/19 1756 99 %     Weight 12/30/19 1758 194 lb (88 kg)     Height 12/30/19 1758 5'  10" (1.778 m)     Head Circumference --      Peak Flow --      Pain Score 12/30/19 1758 2     Pain Loc --      Pain Edu? --      Excl. in GC? --     Constitutional: Alert and oriented. Well appearing and in no distress. Head: Normocephalic and atraumatic. Eyes: Conjunctivae are normal. Normal extraocular movements Mouth/Throat: Mucous membranes are moist. Cardiovascular: Normal rate, regular rhythm. Normal distal pulses. Respiratory: Normal respiratory effort. No wheezes/rales/rhonchi. Gastrointestinal: Soft and nontender. No distention.  Normal active bowel sounds noted.  No CVA tenderness  elicited. GU: deferred Musculoskeletal: Nontender with normal range of motion in all extremities.  Neurologic:  Normal gait without ataxia. Normal speech and language. No gross focal neurologic deficits are appreciated. Skin:  Skin is warm, dry and intact. No rash noted. Psychiatric: Mood and affect are normal. Patient exhibits appropriate insight and judgment. ____________________________________________   LABS (pertinent positives/negatives) Labs Reviewed  COMPREHENSIVE METABOLIC PANEL - Abnormal; Notable for the following components:      Result Value   Potassium 3.4 (*)    CO2 20 (*)    Creatinine, Ser 0.34 (*)    Total Protein 8.4 (*)    All other components within normal limits  CBC - Abnormal; Notable for the following components:   MCHC 36.6 (*)    All other components within normal limits  URINALYSIS, COMPLETE (UACMP) WITH MICROSCOPIC - Abnormal; Notable for the following components:   Color, Urine YELLOW (*)    APPearance CLOUDY (*)    Specific Gravity, Urine 1.031 (*)    Ketones, ur 80 (*)    Protein, ur 100 (*)    Leukocytes,Ua LARGE (*)    Bacteria, UA RARE (*)    All other components within normal limits  HCG, QUANTITATIVE, PREGNANCY - Abnormal; Notable for the following components:   hCG, Beta Chain, Quant, S 86,610 (*)    All other components within normal limits  LIPASE, BLOOD  ____________________________________________   RADIOLOGY  OB Complete Less Than 14 weeks  IMPRESSION: The single live intrauterine pregnancy measuring 13 weeks 1 day ____________________________________________  PROCEDURES  Zofran 4 mg ODT PO Challenge  Procedures ____________________________________________  INITIAL IMPRESSION / ASSESSMENT AND PLAN / ED COURSE  Patient with ED evaluation of nausea and vomiting in the first trimester.  Patient was without other complaint including vaginal discharge or vaginal bleeding.  She reports improvement of her symptoms after IV fluid  and antinausea medicine.  Her labs otherwise reassuring and ultrasound reveals a single IUP consistent with dating.  She tolerated fluid challenge without subsequent emesis.  Exam as above evaluation at this time the patient is encouraged to set up primary OB care with a local health department.  A prescription for Zofran and prenatal vitamins provided with benefit.  Return precautions have been discussed.  Kylie [name] was evaluated in Emergency Department on 12/30/2019 for the symptoms described in the history of present illness. She was evaluated in the context of the global COVID-19 pandemic, which necessitated consideration that the patient might be at risk for infection with the SARS-CoV-2 virus that causes COVID-19. Institutional protocols and algorithms that pertain to the evaluation of patients at risk for COVID-19 are in a state of rapid change based on information released by regulatory bodies including the CDC and federal and state organizations. These policies and algorithms were followed during the patient's care in the ED.  ____________________________________________  FINAL CLINICAL IMPRESSION(S) / ED DIAGNOSES  Final diagnoses:  Hyperemesis gravidarum  Pelvic pain      Wenzel Backlund, Charlesetta Ivory, PA-C 12/30/19 2321    Merwyn Katos, MD 12/30/19 2325

## 2019-12-30 NOTE — Discharge Instructions (Addendum)
Your exam, labs, and ultrasound shows a normally developing fetus. Select an OB to start routine prenatal care. Take the multivitamin daily and the nausea medicine as needed. Return if needed.

## 2020-02-28 ENCOUNTER — Encounter: Payer: Self-pay | Admitting: Obstetrics and Gynecology

## 2020-02-28 ENCOUNTER — Observation Stay
Admission: EM | Admit: 2020-02-28 | Discharge: 2020-02-28 | Disposition: A | Discharge status: Home / Self Care | Payer: Self-pay | Attending: Obstetrics and Gynecology | Admitting: Obstetrics and Gynecology

## 2020-02-28 ENCOUNTER — Other Ambulatory Visit: Payer: Self-pay

## 2020-02-28 DIAGNOSIS — R112 Nausea with vomiting, unspecified: Secondary | ICD-10-CM

## 2020-02-28 DIAGNOSIS — O0932 Supervision of pregnancy with insufficient antenatal care, second trimester: Secondary | ICD-10-CM

## 2020-02-28 DIAGNOSIS — O0001 Abdominal pregnancy with intrauterine pregnancy: Principal | ICD-10-CM | POA: Insufficient documentation

## 2020-02-28 DIAGNOSIS — O26892 Other specified pregnancy related conditions, second trimester: Secondary | ICD-10-CM

## 2020-02-28 DIAGNOSIS — Z3A21 21 weeks gestation of pregnancy: Secondary | ICD-10-CM

## 2020-02-28 DIAGNOSIS — Z87891 Personal history of nicotine dependence: Secondary | ICD-10-CM | POA: Insufficient documentation

## 2020-02-28 DIAGNOSIS — O219 Vomiting of pregnancy, unspecified: Secondary | ICD-10-CM | POA: Diagnosis present

## 2020-02-28 LAB — URINALYSIS, COMPLETE (UACMP) WITH MICROSCOPIC
Bacteria, UA: NONE SEEN
Bilirubin Urine: NEGATIVE
Glucose, UA: NEGATIVE mg/dL
Hgb urine dipstick: NEGATIVE
Ketones, ur: 5 mg/dL — AB
Nitrite: NEGATIVE
Protein, ur: NEGATIVE mg/dL
Specific Gravity, Urine: 1.02 (ref 1.005–1.030)
pH: 5 (ref 5.0–8.0)

## 2020-02-28 LAB — CBC
HCT: 29.9 % — ABNORMAL LOW (ref 36.0–46.0)
Hemoglobin: 10.9 g/dL — ABNORMAL LOW (ref 12.0–15.0)
MCH: 32.2 pg (ref 26.0–34.0)
MCHC: 36.5 g/dL — ABNORMAL HIGH (ref 30.0–36.0)
MCV: 88.2 fL (ref 80.0–100.0)
Platelets: 144 10*3/uL — ABNORMAL LOW (ref 150–400)
RBC: 3.39 MIL/uL — ABNORMAL LOW (ref 3.87–5.11)
RDW: 12.8 % (ref 11.5–15.5)
WBC: 7.4 10*3/uL (ref 4.0–10.5)
nRBC: 0 % (ref 0.0–0.2)

## 2020-02-28 LAB — URINE DRUG SCREEN, QUALITATIVE (ARMC ONLY)
Amphetamines, Ur Screen: NOT DETECTED
Barbiturates, Ur Screen: NOT DETECTED
Benzodiazepine, Ur Scrn: NOT DETECTED
Cannabinoid 50 Ng, Ur ~~LOC~~: POSITIVE — AB
Cocaine Metabolite,Ur ~~LOC~~: NOT DETECTED
MDMA (Ecstasy)Ur Screen: NOT DETECTED
Methadone Scn, Ur: NOT DETECTED
Opiate, Ur Screen: NOT DETECTED
Phencyclidine (PCP) Ur S: NOT DETECTED
Tricyclic, Ur Screen: NOT DETECTED

## 2020-02-28 LAB — COMPREHENSIVE METABOLIC PANEL
ALT: 26 U/L (ref 0–44)
AST: 23 U/L (ref 15–41)
Albumin: 3.1 g/dL — ABNORMAL LOW (ref 3.5–5.0)
Alkaline Phosphatase: 71 U/L (ref 38–126)
Anion gap: 8 (ref 5–15)
BUN: 5 mg/dL — ABNORMAL LOW (ref 6–20)
CO2: 22 mmol/L (ref 22–32)
Calcium: 8.4 mg/dL — ABNORMAL LOW (ref 8.9–10.3)
Chloride: 104 mmol/L (ref 98–111)
Creatinine, Ser: 0.37 mg/dL — ABNORMAL LOW (ref 0.44–1.00)
GFR, Estimated: >60 mL/min (ref >60)
Glucose, Bld: 96 mg/dL (ref 70–99)
Potassium: 3.5 mmol/L (ref 3.5–5.1)
Sodium: 134 mmol/L — ABNORMAL LOW (ref 135–145)
Total Bilirubin: 0.5 mg/dL (ref 0.3–1.2)
Total Protein: 6.5 g/dL (ref 6.5–8.1)

## 2020-02-28 LAB — LIPASE, BLOOD: Lipase: 21 U/L (ref 11–51)

## 2020-02-28 MED ORDER — ONDANSETRON 4 MG PO TBDP
4.0000 mg | ORAL_TABLET | Freq: Four times a day (QID) | ORAL | Status: DC | PRN
  Administered 2020-02-28: 4 mg via ORAL
  Filled 2020-02-28: qty 1

## 2020-02-28 NOTE — Final Progress Note (Signed)
Physician Final Progress Note  Patient ID: Kylie BattiestMary F [name] MRN: [id number] DOB/AGE: 1990/05/04 29 y.o.  Admit date: 02/28/2020 Admitting provider: Conard NovakStephen D Syan Cullimore, MD Discharge date: 02/28/2020   Admission Diagnoses:  1) intrauterine pregnancy at 516w5d  2) nausea and vomiting in pregnancy prior to [redacted] weeks gestation 3) no prenatal care in current pregnancy in second   Discharge Diagnoses:  Active Problems:   Nausea and vomiting during pregnancy prior to [redacted] weeks gestation   No prenatal care in current pregnancy in second trimester   [redacted] weeks gestation of pregnancy   History of Present Illness: The patient is a 29 y.o. female G3P2000 at 7016w5d who presents for acute onset nausea and vomiting with abdominal pain.  At 1130 pm last night she began having acute nausea with 2 episodes of emesis.  She also noted a "band" of her abdomen which became tightened and this tightening would come and go. She pointed to just above her umbilicus as the area of the tightening.  She currently only feels mildly nauseated.  No one else who lives with her has been sick and she has no known sick contacts. She denies fevers, chills.  She notes that the emesis was mostly food, no blood in the emesis.  She has not had diarrhea or constipation or hematochezia or melena. She denies chest pain and shortness of breath.  She denies urinary symptoms and vaginal irritative symptoms. She has not yet established prenatal care, and plans to do so with [facility], per her comments.  Her pregnancy is dated by a 13 week ultrasound, which is different from her LMP dating by 9 days.  She notes +FM, no LOF, no vaginal bleeding, and no lower abdominal pain.   Past Medical History:  Diagnosis Date  . Anemia   . History of gonorrhea    treated 07/2019 at [facility]  [facility]. History of urinary infection   . Irritable bowel syndrome   . Migraine     Past Surgical History:  Procedure Laterality Date  . COLONOSCOPY    .  denies surgical history      No current facility-administered medications on file prior to encounter.   Current Outpatient Medications on File Prior to Encounter  Medication Sig Dispense Refill  . ondansetron (ZOFRAN ODT) 4 MG disintegrating tablet Take 1 tablet (4 mg total) by mouth every 8 (eight) hours as needed. 30 tablet 0  . prenatal vitamin w/FE, FA (PRENATAL 1 + 1) 27-1 MG TABS tablet Take 1 tablet by mouth daily at 12 noon. 30 tablet 2    No Known Allergies  Social History   Socioeconomic History  . Marital status: Single    Spouse name: Not on file  . Number of children: 2  . Years of education: 9  . Highest education level: 9th grade  Occupational History  . Not on file  Tobacco Use  . Smoking status: Former Smoker    Years: 12.00    Types: Cigarettes    Quit date: 10/07/2019    Years since quitting: 0.3  . Smokeless tobacco: Never Used  Vaping Use  . Vaping Use: Never used  Substance and Sexual Activity  . Alcohol use: Yes    Comment: Last ETOH use 10/2019 (unaware pregnant)  . Drug use: Yes    Frequency: 14.0 times per week    Types: Marijuana    Comment: 11/15/2019 - 3 - 4 blunts QD per client; last use was 3 weeks ago   .  Sexual activity: Yes    Partners: Male    Birth control/protection: None    Comment: No BCM used in past 2 - 3 years  Other Topics Concern  . Not on file  Social History Narrative  . Not on file   Social Determinants of Health   Financial Resource Strain:   . Difficulty of Paying Living Expenses: Not on file  Food Insecurity:   . Worried About Programme researcher, broadcasting/film/video in the Last Year: Not on file  . Ran Out of Food in the Last Year: Not on file  Transportation Needs:   . Lack of Transportation (Medical): Not on file  . Lack of Transportation (Non-Medical): Not on file  Physical Activity:   . Days of Exercise per Week: Not on file  . Minutes of Exercise per Session: Not on file  Stress:   . Feeling of Stress : Not on file  Social  Connections:   . Frequency of Communication with Friends and Family: Not on file  . Frequency of Social Gatherings with Friends and Family: Not on file  . Attends Religious Services: Not on file  . Active Member of Clubs or Organizations: Not on file  . Attends Banker Meetings: Not on file  . Marital Status: Not on file  Intimate Partner Violence: Unknown  . Fear of Current or Ex-Partner: No  . Emotionally Abused: No  . Physically Abused: Not on file  . Sexually Abused: No    Family History  Problem Relation Age of Onset  . Hypertension Mother   . Depression Father   . Diabetes Father   . Hypertension Father   . Bipolar disorder Father   . Breast cancer Paternal Grandmother   . Lung cancer Paternal Grandmother   . Depression Half-Sister   . Bipolar disorder Half-Sister   . Lung cancer Paternal Aunt   . Heart disease Paternal Uncle      Review of Systems  Constitutional: Negative.  Negative for chills and fever.  HENT: Negative.   Eyes: Negative.   Respiratory: Negative.   Cardiovascular: Negative.   Gastrointestinal: Positive for abdominal pain, nausea and vomiting. Negative for blood in stool, constipation, diarrhea, heartburn and melena.  Genitourinary: Negative.  Negative for dysuria, hematuria and urgency.  Musculoskeletal: Negative.   Skin: Negative.   Neurological: Negative.   Psychiatric/Behavioral: Negative.      Physical Exam: BP 116/69 (BP Location: Left Arm)   Pulse 88   Temp 97.8 F (36.6 C) (Oral)   Resp 16   Ht 5\' 10"  (1.778 m)   LMP 10/08/2019 (Within Days)   BMI 27.84 kg/m   Physical Exam Constitutional:      General: She is not in acute distress.    Appearance: Normal appearance. She is well-developed and normal weight.  HENT:     Head: Normocephalic and atraumatic.  Eyes:     General: No scleral icterus.    Conjunctiva/sclera: Conjunctivae normal.  Cardiovascular:     Rate and Rhythm: Normal rate and regular rhythm.      Heart sounds: No murmur heard.  No friction rub. No gallop.   Pulmonary:     Effort: Pulmonary effort is normal. No respiratory distress.     Breath sounds: Normal breath sounds. No wheezing or rales.  Abdominal:     General: Bowel sounds are normal. There is no distension.     Palpations: Abdomen is soft. There is no mass.     Tenderness: There is no  abdominal tenderness (no RUQ tenderness, mild epigastric ttp, uterus soft and nontende at level of umb). There is no guarding or rebound.  Musculoskeletal:        General: Normal range of motion.     Cervical back: Normal range of motion and neck supple.  Neurological:     General: No focal deficit present.     Mental Status: She is alert and oriented to person, place, and time.     Cranial Nerves: No cranial nerve deficit.  Skin:    General: Skin is warm and dry.     Findings: No erythema.  Psychiatric:        Mood and Affect: Mood normal.        Behavior: Behavior normal.        Judgment: Judgment normal.     Consults: None  Significant Findings/ Diagnostic Studies:  CBC    Component Value Date/Time   WBC 7.4 02/28/2020 0515   RBC 3.39 (L) 02/28/2020 0515   HGB 10.9 (L) 02/28/2020 0515   HCT 29.9 (L) 02/28/2020 0515   PLT 144 (L) 02/28/2020 0515   MCV 88.2 02/28/2020 0515   MCH 32.2 02/28/2020 0515   MCHC 36.5 (H) 02/28/2020 0515   RDW 12.8 02/28/2020 0515   CMP Latest Ref Rng & Units 02/28/2020  Glucose 70 - 99 mg/dL 96  BUN 6 - 20 mg/dL 5(L)  Creatinine 6.21 - 1.00 mg/dL 3.08(M)  Sodium 578 - 469 mmol/L 134(L)  Potassium 3.5 - 5.1 mmol/L 3.5  Chloride 98 - 111 mmol/L 104  CO2 22 - 32 mmol/L 22  Calcium 8.9 - 10.3 mg/dL 6.2(X)  Total Protein 6.5 - 8.1 g/dL 6.5  Total Bilirubin 0.3 - 1.2 mg/dL 0.5  Alkaline Phos 38 - 126 U/L 71  AST 15 - 41 U/L 23  ALT 0 - 44 U/L 26   Lab Results  Component Value Date   APPEARANCEUR HAZY (A) 02/28/2020   GLUCOSEU NEGATIVE 02/28/2020   BILIRUBINUR NEGATIVE 02/28/2020    KETONESUR 5 (A) 02/28/2020   LABSPEC 1.020 02/28/2020   HGBUR NEGATIVE 02/28/2020   PHURINE 5.0 02/28/2020   NITRITE NEGATIVE 02/28/2020   LEUKOCYTESUR TRACE (A) 02/28/2020   RBCU 0-5 02/28/2020   WBCU 0-5 02/28/2020   BACTERIA NONE SEEN 02/28/2020   EPIU 6-10 02/28/2020   CAOXCRYSTALS PRESENT 02/28/2020     Procedures: none. Fetal heart tones present in 130s.  Hospital Course: The patient was admitted to Labor and Delivery Triage for observation. She had normal vital signs.  The fetal heart rate was noted and appropriate. She was given oral hydration which she tolerated. She was monitored for a few hours and, feeling better, desired discharge.  Since she likely had an acute event she was deemed stable for discharge with precautions to return, if her n/v persisted. She was also encouraged to call our office to set up a visit to establish care.  Discharge Condition: improved   Disposition: Discharge disposition: 01-Home or Self Care       Diet: Regular diet  Discharge Activity: Activity as tolerated   Allergies as of 02/28/2020   No Known Allergies      Follow-up Information    [facility] Schedule an appointment as soon as possible for a visit in 1 week(s).   Specialty: Obstetrics and Gynecology Why: Establish obstetrical care Contact information: [address] [place] [address] [phone]              Total time  spent taking care of this patient: 30 minutes  Signed: Thomasene Mohair, MD  02/28/2020, 8:23 AM

## 2020-02-28 NOTE — OB Triage Note (Signed)
Pt Kylie [name] 29 y.o. presents to the ED complaining of abdominal cramping. Pt is a G3P2 [redacted]w[redacted]d. Pt denies signs and symptons consistent with rupture of membranes or active vaginal bleeding. Pt denies contractions and states positive fetal movement. Doppler heart rate was 130s. Vital signs obtained and within normal limits. Provider to be notified of pt.  Additional comment of note: this pregnancy complains of being shaky at times.

## 2020-02-28 NOTE — Discharge Summary (Signed)
See final progress note. 

## 2020-02-28 NOTE — Progress Notes (Signed)
Discharge instructions reviewed with patient. All questions answered. Pt stable at time of discharge.

## 2020-03-15 ENCOUNTER — Encounter: Payer: Self-pay | Admitting: Obstetrics and Gynecology

## 2020-03-15 ENCOUNTER — Other Ambulatory Visit: Payer: Self-pay

## 2020-03-15 ENCOUNTER — Observation Stay
Admission: EM | Admit: 2020-03-15 | Discharge: 2020-03-15 | Disposition: A | Discharge status: Home / Self Care | Payer: Self-pay | Attending: Obstetrics and Gynecology | Admitting: Obstetrics and Gynecology

## 2020-03-15 ENCOUNTER — Other Ambulatory Visit: Payer: Self-pay | Admitting: Obstetrics

## 2020-03-15 DIAGNOSIS — R300 Dysuria: Secondary | ICD-10-CM | POA: Insufficient documentation

## 2020-03-15 DIAGNOSIS — O26892 Other specified pregnancy related conditions, second trimester: Secondary | ICD-10-CM | POA: Diagnosis present

## 2020-03-15 DIAGNOSIS — O0932 Supervision of pregnancy with insufficient antenatal care, second trimester: Secondary | ICD-10-CM

## 2020-03-15 DIAGNOSIS — O2342 Unspecified infection of urinary tract in pregnancy, second trimester: Principal | ICD-10-CM | POA: Insufficient documentation

## 2020-03-15 DIAGNOSIS — Z3A21 21 weeks gestation of pregnancy: Secondary | ICD-10-CM

## 2020-03-15 DIAGNOSIS — N390 Urinary tract infection, site not specified: Secondary | ICD-10-CM

## 2020-03-15 DIAGNOSIS — Z87891 Personal history of nicotine dependence: Secondary | ICD-10-CM | POA: Insufficient documentation

## 2020-03-15 DIAGNOSIS — Z3A24 24 weeks gestation of pregnancy: Secondary | ICD-10-CM

## 2020-03-15 LAB — DIFFERENTIAL
Abs Immature Granulocytes: 0.05 10*3/uL (ref 0.00–0.07)
Basophils Absolute: 0 10*3/uL (ref 0.0–0.1)
Basophils Relative: 0 %
Eosinophils Absolute: 0.1 10*3/uL (ref 0.0–0.5)
Eosinophils Relative: 1 %
Immature Granulocytes: 1 %
Lymphocytes Relative: 19 %
Lymphs Abs: 1.3 10*3/uL (ref 0.7–4.0)
Monocytes Absolute: 0.5 10*3/uL (ref 0.1–1.0)
Monocytes Relative: 7 %
Neutro Abs: 5.1 10*3/uL (ref 1.7–7.7)
Neutrophils Relative %: 72 %

## 2020-03-15 LAB — CBC
HCT: 33.2 % — ABNORMAL LOW (ref 36.0–46.0)
Hemoglobin: 11.7 g/dL — ABNORMAL LOW (ref 12.0–15.0)
MCH: 31.6 pg (ref 26.0–34.0)
MCHC: 35.2 g/dL (ref 30.0–36.0)
MCV: 89.7 fL (ref 80.0–100.0)
Platelets: 173 10*3/uL (ref 150–400)
RBC: 3.7 MIL/uL — ABNORMAL LOW (ref 3.87–5.11)
RDW: 13 % (ref 11.5–15.5)
WBC: 7.1 10*3/uL (ref 4.0–10.5)
nRBC: 0 % (ref 0.0–0.2)

## 2020-03-15 LAB — URINALYSIS, ROUTINE W REFLEX MICROSCOPIC
Bacteria, UA: NONE SEEN
Bilirubin Urine: NEGATIVE
Glucose, UA: 50 mg/dL — AB
Hgb urine dipstick: NEGATIVE
Ketones, ur: 5 mg/dL — AB
Nitrite: NEGATIVE
Protein, ur: 30 mg/dL — AB
Specific Gravity, Urine: 1.03 (ref 1.005–1.030)
pH: 5 (ref 5.0–8.0)

## 2020-03-15 LAB — OB RESULTS CONSOLE RUBELLA ANTIBODY, IGM: Rubella: IMMUNE

## 2020-03-15 LAB — TYPE AND SCREEN
ABO/RH(D): O POS
Antibody Screen: NEGATIVE

## 2020-03-15 LAB — ABO/RH: ABO/RH(D): O POS

## 2020-03-15 MED ORDER — NITROFURANTOIN MONOHYD MACRO 100 MG PO CAPS
100.0000 mg | ORAL_CAPSULE | Freq: Two times a day (BID) | ORAL | Status: AC
  Administered 2020-03-15: 22:00:00 100 mg via ORAL
  Filled 2020-03-15: qty 1

## 2020-03-15 MED ORDER — PHENAZOPYRIDINE HCL 200 MG PO TABS
200.0000 mg | ORAL_TABLET | Freq: Once | ORAL | Status: AC
  Administered 2020-03-15: 22:00:00 200 mg via ORAL
  Filled 2020-03-15: qty 1

## 2020-03-15 MED ORDER — NITROFURANTOIN MONOHYD MACRO 100 MG PO CAPS: 100.0000 mg | ORAL_CAPSULE | Freq: Two times a day (BID) | ORAL | 1 refills | Status: DC

## 2020-03-15 NOTE — Progress Notes (Signed)
Pt discharged home per order.  Pt stable and ambulatory. An After Visit Summary was printed and given to the patient. Discharge education completed with patient including follow up instructions, prescription pick up, and potential reasons to seek urgent care. Pt received labor and bleeding precautions. Patient able to verbalize understanding, all questions fully answered. Patient instructed to return to ED, call 911, or call MD for any changes in condition. Pt discharged home via personal vehicle.

## 2020-03-15 NOTE — OB Triage Note (Signed)
Pt Kylie [name] 29 y.o. presents to the ED complaining of possible UTI, urgency and frequency with minimal urinary output. Pt. States that she has pain and pressure at her vagina. Pt is a G3P2000 at [redacted]w[redacted]d . Pt denies signs and symptons consistent with rupture of membranes or active vaginal bleeding. Pt denies contractions and states positive fetal movement. External FM and TOCO applied to non-tender abdomen and assessing. Initial FHR is150bpm. Vital signs obtained and within normal limits.

## 2020-03-15 NOTE — Final Progress Note (Signed)
Final Progress Note  Patient ID: Kylie [name] MRN: [id number] DOB/AGE: Nov 13, 1990 29 y.o.  Admit date: 03/15/2020 Admitting provider: Natale Milch, MD Discharge date: 03/15/2020   Admission Diagnoses: Dysuria in pregnancy [redacted] weeks gestation of pregnancy   Discharge Diagnoses:  Active Problems:   Dysuria in pregnancy in second trimester   Abdominal pain in pregnancy, second trimester   [redacted] weeks gestation of pregnancy   Urinary tract infection without hematuria  Insufficient prenatal care  History of Present Illness: The patient is a 29 y.o. female G3P2000 at [redacted]w[redacted]d who presents with complaints of three days of burning with urination, some suprapubic pain, and urinary urgency.  She is currently [redacted] weeks gestation  (dating from a 72 week sono done at [facility] ED). She has not yet had an initial prenatal care visit. She reports that she has applied for Medicaid,but it has not"gone through yet",  And she will use the Health Department for her care.  She reports regular fetal movement, denies any leaking fo fluid, denies vaginal bleeding or contractions. Her baby is moving well.   Past Medical History:  Diagnosis Date  . Anemia   . History of gonorrhea    treated 07/2019 at [facility] Health Dept.  Marland Kitchen History of urinary infection   . Irritable bowel syndrome   . Migraine     Past Surgical History:  Procedure Laterality Date  . COLONOSCOPY    . denies surgical history      No current facility-administered medications on file prior to encounter.   Current Outpatient Medications on File Prior to Encounter  Medication Sig Dispense Refill  . ondansetron (ZOFRAN ODT) 4 MG disintegrating tablet Take 1 tablet (4 mg total) by mouth every 8 (eight) hours as needed. 30 tablet 0  . prenatal vitamin w/FE, FA (PRENATAL 1 + 1) 27-1 MG TABS tablet Take 1 tablet by mouth daily at 12 noon. 30 tablet 2    No Known Allergies  Social History   Socioeconomic History  . Marital status:  Single    Spouse name: Not on file  . Number of children: 2  . Years of education: 9  . Highest education level: 9th grade  Occupational History  . Not on file  Tobacco Use  . Smoking status: Former Smoker    Years: 12.00    Types: Cigarettes    Quit date: 10/07/2019    Years since quitting: 0.4  . Smokeless tobacco: Never Used  Vaping Use  . Vaping Use: Never used  Substance and Sexual Activity  . Alcohol use: Yes    Comment: Last ETOH use 10/2019 (unaware pregnant)  . Drug use: Yes    Frequency: 14.0 times per week    Types: Marijuana    Comment: 11/15/2019 - 3 - 4 blunts QD per client; last use was 3 weeks ago   . Sexual activity: Not Currently    Partners: Male    Birth control/protection: None    Comment: No BCM used in past 2 - 3 years  Other Topics Concern  . Not on file  Social History Narrative  . Not on file   Social Determinants of Health   Financial Resource Strain: Not on file  Food Insecurity: Not on file  Transportation Needs: Not on file  Physical Activity: Not on file  Stress: Not on file  Social Connections: Not on file  Intimate Partner Violence: Unknown  . Fear of Current or Ex-Partner: No  . Emotionally Abused: No  .  Physically Abused: Not on file  . Sexually Abused: No    Family History  Problem Relation Age of Onset  . Hypertension Mother   . Depression Father   . Diabetes Father   . Hypertension Father   . Bipolar disorder Father   . Breast cancer Paternal Grandmother   . Lung cancer Paternal Grandmother   . Depression Half-Sister   . Bipolar disorder Half-Sister   . Lung cancer Paternal Aunt   . Brain cancer Paternal Aunt   . Heart disease Paternal Uncle      Review of Systems  Constitutional: Negative.   HENT: Negative.   Eyes: Negative.   Respiratory: Negative.   Cardiovascular: Negative.   Gastrointestinal: Negative.   Genitourinary: Positive for dysuria, frequency and urgency.  Musculoskeletal: Negative.   Skin:  Negative.   Neurological: Negative.   Endo/Heme/Allergies: Negative.   Psychiatric/Behavioral: Negative.      Physical Exam: BP 126/76 (BP Location: Left Arm)   Pulse 96   Temp 98.1 F (36.7 C) (Oral)   Resp 16   Ht 5\' 10"  (1.778 m)   Wt 95.3 kg   LMP 10/08/2019 (Within Days)   BMI 30.13 kg/m   Physical Exam Constitutional:      Appearance: Normal appearance. She is normal weight.  Genitourinary:     Genitourinary Comments: Fundal height is 24 cms. Exam deferred.  HENT:     Head: Normocephalic and atraumatic.     Nose: Nose normal.  Eyes:     Extraocular Movements: Extraocular movements intact.     Pupils: Pupils are equal, round, and reactive to light.  Cardiovascular:     Rate and Rhythm: Regular rhythm.     Pulses: Normal pulses.  Pulmonary:     Effort: Pulmonary effort is normal.     Breath sounds: Normal breath sounds.  Abdominal:     General: Bowel sounds are normal.     Palpations: Abdomen is soft.  Musculoskeletal:        General: Normal range of motion.     Cervical back: Neck supple.  Neurological:     General: No focal deficit present.     Mental Status: She is alert and oriented to person, place, and time.  Skin:    General: Skin is warm and dry.  Psychiatric:        Behavior: Behavior normal.     Consults: None  Significant Findings/ Diagnostic Studies: labs:  Lab Orders     Culture, Urine- + for leuks, glucose, trace preotein, negative for nitrites, trace bacteria.     Urinalysis, Routine w reflex microscopic Urine, Clean Catch pending     Hepatitis B surface antigen     Rubella screen     RPR     CBC     Differential     HIV Antibody (routine testing w rflx)     Hepatitis C antibody, reflex  Procedures: NST NST Baseline FHR: 145 beats/min Variability: moderate Accelerations: present Decelerations: absent Tocometry: no contractions noted per toco nor palpated  Interpretation:  INDICATIONS: rule out uterine contractions RESULTS:   A NST procedure was performed with FHR monitoring and a normal baseline established, appropriate time of 20-40 minutes of evaluation, and accels >2 seen w 10 x 10 characteristics.  Results show a REACTIVE NST.    Hospital Course: The patient was admitted to Labor and Delivery Triage for observation. She was monitored for a period of time, and the FHTS were very reassuring for this gestational age.  A clean catch urine sample was sent for analysis, and the results indicated a possible urinary tract infection.  She was started on Macrobid for the suspected UTI, and one dose of Pyridium was also provided.  A prescription for Macrobid was sent to her pharmacy.  A prenatal panel was drawn, as she has not yet started care for this pregnancy. She was strongly advised to contact the Health Department to get an appointment quickly as she is in her second trimester. She was then discharged home to follow up at the [facility].      Discharge Condition: good  Disposition:  Discharge disposition: 01-Home or Self Care       Diet: Regular diet  Discharge Activity: Activity as tolerated  Discharge Instructions    Discharge activity:  No Restrictions   Complete by: As directed    Discharge diet:  No restrictions   Complete by: As directed    No sexual activity restrictions   Complete by: As directed    Notify physician for a general feeling that "something is not right"   Complete by: As directed    Notify physician for increase or change in vaginal discharge   Complete by: As directed    Notify physician for intestinal cramps, with or without diarrhea, sometimes described as "gas pain"   Complete by: As directed    Notify physician for leaking of fluid   Complete by: As directed    Notify physician for low, dull backache, unrelieved by heat or Tylenol   Complete by: As directed    Notify physician for menstrual like cramps   Complete by: As directed    Notify physician for pelvic pressure    Complete by: As directed    Notify physician for uterine contractions.  These may be painless and feel like the uterus is tightening or the baby is  "balling up"   Complete by: As directed    Notify physician for vaginal bleeding   Complete by: As directed    PRETERM LABOR:  Includes any of the follwing symptoms that occur between 20 - [redacted] weeks gestation.  If these symptoms are not stopped, preterm labor can result in preterm delivery, placing your baby at risk   Complete by: As directed      Allergies as of 03/15/2020   No Known Allergies           Total time spent taking care of this patient: 40 minutes  Signed: Mirna Mires, CNM  03/15/2020, 10:30 PM

## 2020-03-15 NOTE — Discharge Summary (Signed)
Please see Final Progress Note  Kylie [name], CNM  03/15/2020 10:07 PM

## 2020-03-16 LAB — HEPATITIS B SURFACE ANTIGEN: Hepatitis B Surface Ag: NONREACTIVE

## 2020-03-16 LAB — RPR: RPR Ser Ql: NONREACTIVE

## 2020-03-16 LAB — HIV ANTIBODY (ROUTINE TESTING W REFLEX): HIV Screen 4th Generation wRfx: NONREACTIVE

## 2020-03-17 LAB — RUBELLA SCREEN: Rubella: 1.28 index (ref >0.99)

## 2020-03-17 LAB — URINE CULTURE: Special Requests: NORMAL

## 2020-03-25 NOTE — L&D Delivery Note (Signed)
Delivery Note At 11:27 AM a viable female was delivered via Vaginal, Spontaneous.  Presentation: vertex; Position: Occiput,, Anterior. "Raphael"  Delivery of the head: 06/29/2020 11:26 AM First maneuver: 06/29/2020 11:26 AM, McRoberts Second maneuver: 06/29/2020 11:27 AM, Rotation of posterior shoulder to anterior, then delivery   Time between delivery of head and rest of baby, 40 sec  APGAR: 8, 9  Weight pending .   Placenta status: spon, normal, 3 v cord, .   Cord:  with the following complications: nuchal x1 .    Anesthesia:  Epidural Episiotomy: None Lacerations: 2nd degree Suture Repair: 2.0 vicryl Est. Blood Loss (mL): 200  Mom to postpartum.  Baby to Couplet care / Skin to Skin.  Letitia Libra 06/29/2020, 11:48 AM

## 2020-04-14 ENCOUNTER — Observation Stay
Admission: EM | Admit: 2020-04-14 | Discharge: 2020-04-14 | Disposition: A | Discharge status: Home / Self Care | Payer: Medicaid Other | Attending: Obstetrics and Gynecology | Admitting: Obstetrics and Gynecology

## 2020-04-14 ENCOUNTER — Encounter: Payer: Self-pay | Admitting: Obstetrics and Gynecology

## 2020-04-14 ENCOUNTER — Observation Stay: Payer: Medicaid Other

## 2020-04-14 ENCOUNTER — Other Ambulatory Visit: Payer: Self-pay

## 2020-04-14 DIAGNOSIS — O26893 Other specified pregnancy related conditions, third trimester: Secondary | ICD-10-CM | POA: Insufficient documentation

## 2020-04-14 DIAGNOSIS — Z3A28 28 weeks gestation of pregnancy: Secondary | ICD-10-CM | POA: Diagnosis not present

## 2020-04-14 DIAGNOSIS — O99891 Other specified diseases and conditions complicating pregnancy: Secondary | ICD-10-CM | POA: Diagnosis not present

## 2020-04-14 DIAGNOSIS — R300 Dysuria: Secondary | ICD-10-CM | POA: Diagnosis not present

## 2020-04-14 DIAGNOSIS — O0932 Supervision of pregnancy with insufficient antenatal care, second trimester: Secondary | ICD-10-CM

## 2020-04-14 DIAGNOSIS — O98513 Other viral diseases complicating pregnancy, third trimester: Secondary | ICD-10-CM | POA: Diagnosis not present

## 2020-04-14 DIAGNOSIS — O26892 Other specified pregnancy related conditions, second trimester: Secondary | ICD-10-CM | POA: Diagnosis not present

## 2020-04-14 DIAGNOSIS — U071 COVID-19: Secondary | ICD-10-CM

## 2020-04-14 DIAGNOSIS — M549 Dorsalgia, unspecified: Secondary | ICD-10-CM | POA: Diagnosis not present

## 2020-04-14 DIAGNOSIS — Z3A21 21 weeks gestation of pregnancy: Secondary | ICD-10-CM

## 2020-04-14 LAB — COMPREHENSIVE METABOLIC PANEL
ALT: 21 U/L (ref 0–44)
AST: 22 U/L (ref 15–41)
Albumin: 3.2 g/dL — ABNORMAL LOW (ref 3.5–5.0)
Alkaline Phosphatase: 85 U/L (ref 38–126)
Anion gap: 12 (ref 5–15)
BUN: 7 mg/dL (ref 6–20)
CO2: 21 mmol/L — ABNORMAL LOW (ref 22–32)
Calcium: 8.6 mg/dL — ABNORMAL LOW (ref 8.9–10.3)
Chloride: 104 mmol/L (ref 98–111)
Creatinine, Ser: 0.41 mg/dL — ABNORMAL LOW (ref 0.44–1.00)
GFR, Estimated: >60 mL/min (ref >60)
Glucose, Bld: 106 mg/dL — ABNORMAL HIGH (ref 70–99)
Potassium: 3.5 mmol/L (ref 3.5–5.1)
Sodium: 137 mmol/L (ref 135–145)
Total Bilirubin: 0.7 mg/dL (ref 0.3–1.2)
Total Protein: 7 g/dL (ref 6.5–8.1)

## 2020-04-14 LAB — URINALYSIS, COMPLETE (UACMP) WITH MICROSCOPIC
Bilirubin Urine: NEGATIVE
Glucose, UA: 50 mg/dL — AB
Hgb urine dipstick: NEGATIVE
Ketones, ur: 80 mg/dL — AB
Leukocytes,Ua: NEGATIVE
Nitrite: NEGATIVE
Protein, ur: 30 mg/dL — AB
Specific Gravity, Urine: 1.024 (ref 1.005–1.030)
pH: 5 (ref 5.0–8.0)

## 2020-04-14 LAB — URINE DRUG SCREEN, QUALITATIVE (ARMC ONLY)
Amphetamines, Ur Screen: NOT DETECTED
Barbiturates, Ur Screen: NOT DETECTED
Benzodiazepine, Ur Scrn: NOT DETECTED
Cannabinoid 50 Ng, Ur ~~LOC~~: POSITIVE — AB
Cocaine Metabolite,Ur ~~LOC~~: NOT DETECTED
MDMA (Ecstasy)Ur Screen: NOT DETECTED
Methadone Scn, Ur: NOT DETECTED
Opiate, Ur Screen: NOT DETECTED
Phencyclidine (PCP) Ur S: NOT DETECTED
Tricyclic, Ur Screen: NOT DETECTED

## 2020-04-14 LAB — CHLAMYDIA/NGC RT PCR (ARMC ONLY)
Chlamydia Tr: NOT DETECTED
N gonorrhoeae: NOT DETECTED

## 2020-04-14 LAB — CBC
HCT: 28.7 % — ABNORMAL LOW (ref 36.0–46.0)
Hemoglobin: 10.1 g/dL — ABNORMAL LOW (ref 12.0–15.0)
MCH: 31.5 pg (ref 26.0–34.0)
MCHC: 35.2 g/dL (ref 30.0–36.0)
MCV: 89.4 fL (ref 80.0–100.0)
Platelets: 140 10*3/uL — ABNORMAL LOW (ref 150–400)
RBC: 3.21 MIL/uL — ABNORMAL LOW (ref 3.87–5.11)
RDW: 13.1 % (ref 11.5–15.5)
WBC: 5.6 10*3/uL (ref 4.0–10.5)
nRBC: 0 % (ref 0.0–0.2)

## 2020-04-14 LAB — WET PREP, GENITAL
Clue Cells Wet Prep HPF POC: NONE SEEN
Sperm: NONE SEEN
Trich, Wet Prep: NONE SEEN
Yeast Wet Prep HPF POC: NONE SEEN

## 2020-04-14 LAB — SARS CORONAVIRUS 2 BY RT PCR (HOSPITAL ORDER, PERFORMED IN ~~LOC~~ HOSPITAL LAB): SARS Coronavirus 2: POSITIVE — AB

## 2020-04-14 MED ORDER — LACTATED RINGERS IV SOLN: INTRAVENOUS | Status: DC

## 2020-04-14 MED ORDER — ACETAMINOPHEN 500 MG PO TABS: 1000.0000 mg | ORAL_TABLET | Freq: Four times a day (QID) | ORAL | Status: DC | PRN

## 2020-04-14 MED ORDER — ONDANSETRON HCL 4 MG/2ML IJ SOLN
4.0000 mg | INTRAMUSCULAR | Status: DC | PRN
  Administered 2020-04-14: 4 mg via INTRAVENOUS
  Filled 2020-04-14: qty 2

## 2020-04-14 MED ORDER — LACTATED RINGERS IV BOLUS
500.0000 mL | Freq: Once | INTRAVENOUS | Status: AC
  Administered 2020-04-14: 500 mL via INTRAVENOUS

## 2020-04-14 MED ORDER — ONDANSETRON 4 MG PO TBDP
4.0000 mg | ORAL_TABLET | Freq: Three times a day (TID) | ORAL | 0 refills | Status: DC | PRN
Start: 2020-04-14 — End: 2020-06-29

## 2020-04-14 MED ORDER — FENTANYL CITRATE (PF) 100 MCG/2ML IJ SOLN
25.0000 ug | INTRAMUSCULAR | Status: DC | PRN
  Administered 2020-04-14: 25 ug via INTRAVENOUS
  Filled 2020-04-14: qty 2

## 2020-04-14 NOTE — H&P (Signed)
OB History & Physical   History of Present Illness:  Chief Complaint: back pain, nausea, vomiting  HPI:  Kylie [name] is a 30 y.o. G41P2002 female at [redacted]w[redacted]d dated by 13 week ultrasound.  Her pregnancy has been complicated by insufficient prenatal care, marijuana use, urinary tract infection.    She denies contractions.   She denies leakage of fluid.   She denies vaginal bleeding.   She reports fetal movement.    Patient presents with severe/sharp low back pain that started last night. She has nausea and vomiting she states related to the pain. She is unable to keep anything down since last night. She admits a mild headache. She denies fever or exposure to covid. She denies respiratory symptoms. She denies urinary or vaginal symptoms.   Total weight gain for pregnancy: Not found.   Obstetrical Problem List: pregnancy3 Problems (from 02/28/20 to present)    Problem Noted Resolved   Dysuria in pregnancy in second trimester 03/15/2020 by Mirna Mires, CNM No   No prenatal care in current pregnancy in second trimester 02/28/2020 by Conard Novak, MD No   [redacted] weeks gestation of pregnancy 02/28/2020 by Conard Novak, MD No       Maternal Medical History:   Past Medical History:  Diagnosis Date   Anemia    History of gonorrhea    treated 07/2019 at [facility] Health Dept.   History of urinary infection    Irritable bowel syndrome    Migraine     Past Surgical History:  Procedure Laterality Date   COLONOSCOPY     denies surgical history     NO PAST SURGERIES      No Known Allergies  Prior to Admission medications   Medication Sig Start Date End Date Taking? Authorizing Provider  nitrofurantoin, macrocrystal-monohydrate, (MACROBID) 100 MG capsule Take 1 capsule (100 mg total) by mouth 2 (two) times daily. Patient not taking: No sig reported 03/15/20   Mirna Mires, CNM  ondansetron (ZOFRAN ODT) 4 MG disintegrating tablet Take 1 tablet (4 mg total) by  mouth every 8 (eight) hours as needed. Patient not taking: No sig reported 12/30/19   Menshew, Charlesetta Ivory, PA-C    OB History  Gravida Para Term Preterm AB Living  3 2 2     2   SAB IAB Ectopic Multiple Live Births          2    # Outcome Date GA Lbr Len/2nd Weight Sex Delivery Anes PTL Lv  3 Current           2 Term 03/25/14 [redacted]w[redacted]d   F Vag-Spont   LIV     Birth Comments: [facility], IOL  1 Term 12/21/09 [redacted]w[redacted]d   M Vag-Spont   LIV     Birth Comments: [facility], nuchal cord    Prenatal care site: no established care  Social History: She  reports that she quit smoking about 6 months ago. Her smoking use included cigarettes. She quit after 12.00 years of use. She has never used smokeless tobacco. She reports current alcohol use. She reports current drug use. Frequency: 14.00 times per week. Drug: Marijuana.  Family History: family history includes Bipolar disorder in her father and half-sister; Brain cancer in her paternal aunt; Breast cancer in her paternal grandmother; Depression in her father and half-sister; Diabetes in her father; Heart disease in her paternal uncle; Hypertension in her father and mother; Lung cancer in her paternal aunt and paternal grandmother.  Review of Systems:  Review of Systems  Constitutional: Negative for chills and fever.  HENT: Negative for congestion, ear discharge, ear pain, hearing loss, sinus pain and sore throat.   Eyes: Negative for blurred vision and double vision.  Respiratory: Negative for cough, shortness of breath and wheezing.   Cardiovascular: Negative for chest pain, palpitations and leg swelling.  Gastrointestinal: Positive for nausea and vomiting. Negative for abdominal pain, blood in stool, constipation, diarrhea, heartburn and melena.  Genitourinary: Negative for dysuria, flank pain, frequency, hematuria and urgency.  Musculoskeletal: Positive for back pain. Negative for joint pain and myalgias.  Skin: Negative for itching and rash.   Neurological: Positive for headaches. Negative for dizziness, tingling, tremors, sensory change, speech change, focal weakness, seizures, loss of consciousness and weakness.  Endo/Heme/Allergies: Negative for environmental allergies. Does not bruise/bleed easily.  Psychiatric/Behavioral: Negative for depression, hallucinations, memory loss, substance abuse and suicidal ideas. The patient is not nervous/anxious and does not have insomnia.      Physical Exam:  BP (!) 130/57 (BP Location: Right Arm)    Pulse (!) 118    Temp 99.1 F (37.3 C) (Oral)    Resp 20    LMP 10/08/2019 (Within Days)   Constitutional: Well nourished, well developed female in acute distress.  HEENT: normal Skin: Warm and dry.  Cardiovascular: Regular tachy rate and rhythm.   Extremity: no edema  Respiratory: Clear to auscultation bilateral. Normal respiratory effort Abdomen: FHT present, mild to palpation Back: no CVAT, low back tenderness Neuro: DTRs 2+, Cranial nerves grossly intact Psych: Alert and Oriented x3. No memory deficits.    Baseline FHR: 150 beats/min   Variability: moderate   Accelerations: present   Decelerations: absent Contractions: absent  Overall assessment: reassuring  Results for Kylie, [name] (MRN [id number]) as of 04/14/2020 13:31  Ref. Range 04/14/2020 11:07 04/14/2020 11:10 04/14/2020 11:24 04/14/2020 12:37  COMPREHENSIVE METABOLIC PANEL Unknown Rpt (A)     Sodium Latest Ref Range: 135 - 145 mmol/L 137     Potassium Latest Ref Range: 3.5 - 5.1 mmol/L 3.5     Chloride Latest Ref Range: 98 - 111 mmol/L 104     CO2 Latest Ref Range: 22 - 32 mmol/L 21 (L)     Glucose Latest Ref Range: 70 - 99 mg/dL 160 (H)     BUN Latest Ref Range: 6 - 20 mg/dL 7     Creatinine Latest Ref Range: 0.44 - 1.00 mg/dL 1.09 (L)     Calcium Latest Ref Range: 8.9 - 10.3 mg/dL 8.6 (L)     Anion gap Latest Ref Range: 5 - 15  12     Alkaline Phosphatase Latest Ref Range: 38 - 126 U/L 85     Albumin Latest Ref Range: 3.5  - 5.0 g/dL 3.2 (L)     AST Latest Ref Range: 15 - 41 U/L 22     ALT Latest Ref Range: 0 - 44 U/L 21     Total Protein Latest Ref Range: 6.5 - 8.1 g/dL 7.0     Total Bilirubin Latest Ref Range: 0.3 - 1.2 mg/dL 0.7     GFR, Estimated Latest Ref Range: >60 mL/min >60     WBC Latest Ref Range: 4.0 - 10.5 K/uL 5.6     RBC Latest Ref Range: 3.87 - 5.11 MIL/uL 3.21 (L)     Hemoglobin Latest Ref Range: 12.0 - 15.0 g/dL 32.3 (L)     HCT Latest Ref Range: 36.0 - 46.0 % 28.7 (  L)     MCV Latest Ref Range: 80.0 - 100.0 fL 89.4     MCH Latest Ref Range: 26.0 - 34.0 pg 31.5     MCHC Latest Ref Range: 30.0 - 36.0 g/dL 31.5     RDW Latest Ref Range: 11.5 - 15.5 % 13.1     Platelets Latest Ref Range: 150 - 400 K/uL 140 (L)     nRBC Latest Ref Range: 0.0 - 0.2 % 0.0     SARS CORONAVIRUS 2 (HOSPITAL ORDER, PERFORMED IN [facility]) Unknown   Rpt (A)   Yeast Wet Prep HPF POC Latest Ref Range: NONE SEEN   NONE SEEN    Trich, Wet Prep Latest Ref Range: NONE SEEN   NONE SEEN    Clue Cells Wet Prep HPF POC Latest Ref Range: NONE SEEN   NONE SEEN    WBC, Wet Prep HPF POC Latest Ref Range: NONE SEEN   FEW (A)    WET PREP, GENITAL Unknown  Rpt (A)    US OB COMP + 14 WK Unknown    Rpt  US RENAL Unknown    Rpt   Results of anatomy scan and renal scan are pending Results of UA pending  Lab Results  Component Value Date   SARSCOV2NAA POSITIVE (A) 04/14/2020  ]  Assessment:  ARNESIA VINCELETTE is a 30 y.o. G77P2002 female at [redacted]w[redacted]d with Covid, back pain, insufficient prenatal care.   Plan:  1. Admit to Observation 2. CBC, CMP, UA, Wet Prep, GC/CT, UDS, Covid test 3. Renal u/s, Anatomy u/s 4. IV fluids, bolus followed by continuous, zofran prn, fentanyl prn  5. Fetal well-being: Reassuring 6. Contact/airborn precautions  Tresea Mall, CNM 04/14/2020 1:17 PM

## 2020-04-14 NOTE — Discharge Summary (Signed)
Physician Final Progress Note  Patient ID: Kylie [name] MRN: [id number] DOB/AGE: 1990/04/05 30 y.o.  Admit date: 04/14/2020 Admitting provider: Tresea Mall, CNM Discharge date: 04/14/2020   Admission Diagnoses: Lumbago  Discharge Diagnoses:  Active Problems:   Back pain affecting pregnancy  30 y.o. G3P2002 at [redacted]w[redacted]d by Estimated Date of Delivery: 07/05/20 presenting with lower back pain, night sweats, nausea and vomitting.  UA and renal ultrasound normal.  +FM, no LOF, no VB, no contractions.  COVID test positive.  Discussed supportive measures for covid and symtoms to prompt representation.  Also discussed finding of fetal pyelectasis on ultrasound today and need for follow up at 32-36weeks.  Blood pressure (!) 132/55, pulse (!) 120, temperature 98.1 F (36.7 C), temperature source Oral, resp. rate 16, last menstrual period 10/08/2019, SpO2 98 %.    Consults: None  Significant Findings/ Diagnostic Studies: US OB Comp + 14 Wk  Result Date: 04/14/2020 CLINICAL DATA:  Back pain, no prior prenatal care EXAM: OBSTETRICAL ULTRASOUND >14 WKS FINDINGS: Number of Fetuses: 1 Heart Rate:  157 bpm Movement: Yes Presentation: Transverse Previa: No Placental Location: Posterior Amniotic Fluid (Subjective): There are normal Amniotic Fluid (Objective): AFI = 19.3 cm (5%ile= 9.4 cm, 95%= 22.8 cm for 28 wks) FETAL BIOMETRY BPD: 7.6cm 30w 4d HC:   28.6cm 31w 3d AC:   25.1cm 29w 2d FL:   5.25cm 28w 0d Current Mean GA: 29w 4d                     [place] EDC: 06/26/2020 Assigned GA:  28w 2d            Assigned EDC: 07/05/2020 Estimated Fetal Weight: 1343.5g 70.6%ile FETAL ANATOMY Lateral Ventricles: Appears normal Thalami/CSP: Appears normal Posterior Fossa:  Not visualized Nuchal Region: Not visualized   NFT= N/A > 20 WKS Upper Lip: Appears normal Spine: Not visualized 4 Chamber Heart on Left: Appears normal LVOT: Previously seen RVOT: Not well visualized Stomach on Left: Appears normal 3 Vessel Cord: Appears  normal Cord Insertion site: Not visualized Kidneys: Prominent right renal pelvis measuring 4 mm. Mild left pelviectasis measuring 6 mm. Bladder: Appears normal Extremities: Appears normal Sex: Female Technically difficult due to: None Maternal Findings: Cervix:  3.6 cm in length.  Closed. IMPRESSION: 1. Single live intrauterine pregnancy. 2. Prominent right renal pelvis measuring 4 mm. Mild left pelviectasis measuring 6 mm. Recommend attention on follow-up examination. Electronically Signed   By: Elige Ko   On: 04/14/2020 14:07   US RENAL  Result Date: 04/14/2020 CLINICAL DATA:  Back pain EXAM: RENAL / URINARY TRACT ULTRASOUND COMPLETE COMPARISON:  05/05/2012. FINDINGS: Right Kidney: Renal measurements: 13.2 x 5.0 x 6.5 cm = volume: 223.1 mL. Echogenicity within normal limits. No mass or hydronephrosis visualized. Left Kidney: Renal measurements: 13.9 x 6.2 x 5.4 cm = volume: 241.9 mL. Echogenicity within normal limits. No mass or hydronephrosis visualized. Bladder: Appears normal for degree of bladder distention. Other: None. IMPRESSION: Unremarkable renal ultrasound. Electronically Signed   By: Stana Bunting M.D.   On: 04/14/2020 13:59   Results for orders placed or performed during the hospital encounter of 04/14/20 (from the past 24 hour(s))  CBC     Status: Abnormal   Collection Time: 04/14/20 11:07 AM  Result Value Ref Range   WBC 5.6 4.0 - 10.5 K/uL   RBC 3.21 (L) 3.87 - 5.11 MIL/uL   Hemoglobin 10.1 (L) 12.0 - 15.0 g/dL   HCT 75.4 (L) 49.2 - 01.0 %  MCV 89.4 80.0 - 100.0 fL   MCH 31.5 26.0 - 34.0 pg   MCHC 35.2 30.0 - 36.0 g/dL   RDW 55.7 32.2 - 02.5 %   Platelets 140 (L) 150 - 400 K/uL   nRBC 0.0 0.0 - 0.2 %  Comprehensive metabolic panel     Status: Abnormal   Collection Time: 04/14/20 11:07 AM  Result Value Ref Range   Sodium 137 135 - 145 mmol/L   Potassium 3.5 3.5 - 5.1 mmol/L   Chloride 104 98 - 111 mmol/L   CO2 21 (L) 22 - 32 mmol/L   Glucose, Bld 106 (H) 70 - 99  mg/dL   BUN 7 6 - 20 mg/dL   Creatinine, Ser 4.27 (L) 0.44 - 1.00 mg/dL   Calcium 8.6 (L) 8.9 - 10.3 mg/dL   Total Protein 7.0 6.5 - 8.1 g/dL   Albumin 3.2 (L) 3.5 - 5.0 g/dL   AST 22 15 - 41 U/L   ALT 21 0 - 44 U/L   Alkaline Phosphatase 85 38 - 126 U/L   Total Bilirubin 0.7 0.3 - 1.2 mg/dL   GFR, Estimated >06 >23 mL/min   Anion gap 12 5 - 15  Urinalysis, Complete w Microscopic Urine, Clean Catch     Status: Abnormal   Collection Time: 04/14/20 11:10 AM  Result Value Ref Range   Color, Urine YELLOW (A) YELLOW   APPearance HAZY (A) CLEAR   Specific Gravity, Urine 1.024 1.005 - 1.030   pH 5.0 5.0 - 8.0   Glucose, UA 50 (A) NEGATIVE mg/dL   Hgb urine dipstick NEGATIVE NEGATIVE   Bilirubin Urine NEGATIVE NEGATIVE   Ketones, ur 80 (A) NEGATIVE mg/dL   Protein, ur 30 (A) NEGATIVE mg/dL   Nitrite NEGATIVE NEGATIVE   Leukocytes,Ua NEGATIVE NEGATIVE   RBC / HPF 0-5 0 - 5 RBC/hpf   WBC, UA 0-5 0 - 5 WBC/hpf   Bacteria, UA RARE (A) NONE SEEN   Squamous Epithelial / LPF 0-5 0 - 5   Mucus PRESENT   Urine Drug Screen, Qualitative ([facility] only)     Status: Abnormal   Collection Time: 04/14/20 11:10 AM  Result Value Ref Range   Tricyclic, Ur Screen NONE DETECTED NONE DETECTED   Amphetamines, Ur Screen NONE DETECTED NONE DETECTED   MDMA (Ecstasy)Ur Screen NONE DETECTED NONE DETECTED   Cocaine Metabolite,Ur [place] NONE DETECTED NONE DETECTED   Opiate, Ur Screen NONE DETECTED NONE DETECTED   Phencyclidine (PCP) Ur S NONE DETECTED NONE DETECTED   Cannabinoid 50 Ng, Ur [place] POSITIVE (A) NONE DETECTED   Barbiturates, Ur Screen NONE DETECTED NONE DETECTED   Benzodiazepine, Ur Scrn NONE DETECTED NONE DETECTED   Methadone Scn, Ur NONE DETECTED NONE DETECTED  Wet prep, genital     Status: Abnormal   Collection Time: 04/14/20 11:10 AM  Result Value Ref Range   Yeast Wet Prep HPF POC NONE SEEN NONE SEEN   Trich, Wet Prep NONE SEEN NONE SEEN   Clue Cells Wet Prep HPF POC NONE SEEN NONE SEEN   WBC, Wet  Prep HPF POC FEW (A) NONE SEEN   Sperm NONE SEEN   Chlamydia/NGC rt PCR ([facility] only)     Status: None   Collection Time: 04/14/20 11:10 AM   Specimen: Cervical/Vaginal swab  Result Value Ref Range   Specimen source GC/Chlam URINE, RANDOM    Chlamydia Tr NOT DETECTED NOT DETECTED   N gonorrhoeae NOT DETECTED NOT DETECTED  SARS Coronavirus 2 by RT  PCR (hospital order, performed in [facility] hospital lab) Nasopharyngeal Nasopharyngeal Swab     Status: Abnormal   Collection Time: 04/14/20 11:24 AM   Specimen: Nasopharyngeal Swab  Result Value Ref Range   SARS Coronavirus 2 POSITIVE (A) NEGATIVE     Procedures:  Baseline: *150 Variability: moderate Accelerations: present Decelerations: absent Tocometry: none The patient was monitored for 30 minutes, fetal heart rate tracing was deemed reactive, category I tracing,  CPT [id number]   Discharge Condition: good  Disposition: Discharge disposition: 01-Home or Self Care       Diet: Regular diet  Discharge Activity: Activity as tolerated  Discharge Instructions    Discharge activity:  No Restrictions   Complete by: As directed    Discharge diet:  No restrictions   Complete by: As directed    No sexual activity restrictions   Complete by: As directed    Notify physician for a general feeling that "something is not right"   Complete by: As directed    Notify physician for increase or change in vaginal discharge   Complete by: As directed    Notify physician for intestinal cramps, with or without diarrhea, sometimes described as "gas pain"   Complete by: As directed    Notify physician for leaking of fluid   Complete by: As directed    Notify physician for low, dull backache, unrelieved by heat or Tylenol   Complete by: As directed    Notify physician for menstrual like cramps   Complete by: As directed    Notify physician for pelvic pressure   Complete by: As directed    Notify physician for uterine contractions.  These  may be painless and feel like the uterus is tightening or the baby is  "balling up"   Complete by: As directed    Notify physician for vaginal bleeding   Complete by: As directed    PRETERM LABOR:  Includes any of the follwing symptoms that occur between 20 - [redacted] weeks gestation.  If these symptoms are not stopped, preterm labor can result in preterm delivery, placing your baby at risk   Complete by: As directed      Allergies as of 04/14/2020   No Known Allergies     Medication List    STOP taking these medications   nitrofurantoin (macrocrystal-monohydrate) 100 MG capsule Commonly known as: MACROBID     TAKE these medications   ondansetron 4 MG disintegrating tablet Commonly known as: Zofran ODT Take 1 tablet (4 mg total) by mouth every 8 (eight) hours as needed.        Total time spent taking care of this patient: 45 minutes  Signed: Vena Austria 04/14/2020, 5:05 PM

## 2020-04-14 NOTE — OB Triage Note (Signed)
Pt. reports to Labor & Delivery following back pain rated 8/10 and multiple episodes (x12) of emesis since 2100 last night 01/20. She states the pain is sharp and is located in her mid, lower back. She is unable to void or poop d/t lack of intake after her numerous episodes of vomiting. She reports decreased fetal movement. She denies leaking of fluid or abnormal vaginal discharge or bleeding. External FHR monitor and Toco applied. Vital signs taken: maternal pulse elevated at 118, temperature 99.2. Will continue to monitor.

## 2020-04-19 ENCOUNTER — Other Ambulatory Visit: Payer: Self-pay

## 2020-04-19 ENCOUNTER — Ambulatory Visit: Payer: Medicaid Other | Admitting: Advanced Practice Midwife

## 2020-04-19 ENCOUNTER — Other Ambulatory Visit (HOSPITAL_COMMUNITY): Payer: Self-pay | Admitting: Advanced Practice Midwife

## 2020-04-19 ENCOUNTER — Encounter: Payer: Self-pay | Admitting: Advanced Practice Midwife

## 2020-04-19 ENCOUNTER — Telehealth: Payer: Self-pay

## 2020-04-19 ENCOUNTER — Other Ambulatory Visit: Payer: Self-pay | Admitting: Advanced Practice Midwife

## 2020-04-19 DIAGNOSIS — N390 Urinary tract infection, site not specified: Secondary | ICD-10-CM | POA: Diagnosis not present

## 2020-04-19 DIAGNOSIS — O0993 Supervision of high risk pregnancy, unspecified, third trimester: Secondary | ICD-10-CM

## 2020-04-19 DIAGNOSIS — O0930 Supervision of pregnancy with insufficient antenatal care, unspecified trimester: Secondary | ICD-10-CM | POA: Diagnosis not present

## 2020-04-19 DIAGNOSIS — R9389 Abnormal findings on diagnostic imaging of other specified body structures: Secondary | ICD-10-CM | POA: Insufficient documentation

## 2020-04-19 DIAGNOSIS — E663 Overweight: Secondary | ICD-10-CM

## 2020-04-19 DIAGNOSIS — O98513 Other viral diseases complicating pregnancy, third trimester: Secondary | ICD-10-CM

## 2020-04-19 DIAGNOSIS — K589 Irritable bowel syndrome without diarrhea: Secondary | ICD-10-CM | POA: Insufficient documentation

## 2020-04-19 DIAGNOSIS — F1290 Cannabis use, unspecified, uncomplicated: Secondary | ICD-10-CM | POA: Insufficient documentation

## 2020-04-19 LAB — OB RESULTS CONSOLE HEPATITIS B SURFACE ANTIGEN: Hepatitis B Surface Ag: NEGATIVE

## 2020-04-19 LAB — OB RESULTS CONSOLE VARICELLA ZOSTER ANTIBODY, IGG: Varicella: IMMUNE

## 2020-04-19 LAB — OB RESULTS CONSOLE ABO/RH: RH Type: POSITIVE

## 2020-04-19 LAB — OB RESULTS CONSOLE RPR: RPR: NONREACTIVE

## 2020-04-19 LAB — OB RESULTS CONSOLE ANTIBODY SCREEN: Antibody Screen: NEGATIVE

## 2020-04-19 LAB — OB RESULTS CONSOLE HIV ANTIBODY (ROUTINE TESTING): HIV: NONREACTIVE

## 2020-04-19 NOTE — Progress Notes (Signed)
Wet mount (pt denies symptoms), hgb, and urinalysis reviewed by provider; no tx per provider order and standing orders. U/S referral faxed to [facility]; confirmation received.

## 2020-04-19 NOTE — Telephone Encounter (Signed)
TC to patient on 04/18/2020 to assess covid symptoms. Patient was tested for covid 19 on 04/14/2020 at [facility] ED and was positive. Patient states she had vomiting and low grade fever about 99.106F, starting on 04/13/2020. Patient is now 5 days from onset of symptoms and will be 6 days out at her new OB appointment on 04/19/2020. Patient states she has been fever free without medication for 3 days, and denies cough, sore throat and denies other symptoms. Patient counseled to arrive at 1:00 with mask in place. Patient states understanding.Burt Knack, RN

## 2020-04-19 NOTE — Progress Notes (Signed)
See recent pt history of labs and visits to [facility]. Discussed recent hx hospital visits with provider; proceed with ordering all our normal maternity labs for initial prenatal care. No records in [place]; pt declines Tdap, influenza, and covid vaccines. Pt plans to breastfeed, desires BTL, and is not sure about the pediatrician at this time. List of pediatricians provided to pt. Pt counseld about  PTL and literature provided.

## 2020-04-19 NOTE — Progress Notes (Signed)
[facility] HEALTH DEPT [facility] [address] [place] [address] [phone]  INITIAL PRENATAL VISIT NOTE  Subjective:  Kylie [name] is a 30 y.o. SWF G3P2002 (10,6) at [redacted]w[redacted]d being seen today to start prenatal care at the [facility] Department. She feels "tired" about surprise pregnancy with no birth control.  30 yo employed FOB feels "we weren't happy at first but we've accepted it now cuz it's a boy" about pregnancy; in supportive 3 year relationship.  She is living with her mom and her 2 kids, unemployed and not in school.  Finished 8th grade.  Ex smoker with last use 02/2020.  Last MJ 04/07/20.  Last ETOH 10/2019 (5 mixed drinks). She has had 2 u/s this pregnancy--12/30/19 at 13 1/7 wks with EDC=07/05/20.  Second u/s 04/14/20 with AFI wnl, no previa, posterior placenta, EFW 71%, 3VC, prominent right renal pelvis 4 mm, mild left pelviectasis 6 mm.  Last pap 01/11/15 neg.    +UDS MJ 02/28/20 in ER.  Numerous ER visits this pregnancy: 11/18/19, 12/30/19, 02/28/20, 03/15/20, 04/14/20.  She is currently monitored for the following issues for this high-risk pregnancy and has Anxiety associated with depression; Urinary tract infection without hematuria 03/14/21 in ER; COVID-19 affecting pregnancy in third trimester 04/14/20; Late prenatal care 29 wks; Overweight BMI=29; Supervision of high risk pregnancy in third trimester; Irritable bowel syndrome; history macrosomic infant 9 lb 3 oz 12/21/09; and fetal prominent right rental pelvis on 04/14/20 u/s on their problem list.  Patient reports no complaints.  Contractions: Not present.  .  Movement: Present. Denies leaking of fluid.   Indications for ASA therapy (per uptodate) One of the following: Previous pregnancy with preeclampsia, especially early onset and with an adverse outcome No Multifetal gestation No Chronic hypertension No Type 1 or 2 diabetes mellitus No Chronic kidney disease  No Autoimmune disease (antiphospholipid syndrome, systemic lupus erythematosus) No  Two or more of the following: Nulliparity No Obesity (body mass index >30 kg/m2) No Family history of preeclampsia in mother or sister No Age ?35 years No Sociodemographic characteristics (African American race, low socioeconomic level) No Personal risk factors (eg, previous pregnancy with low birth weight or small for gestational age infant, previous adverse pregnancy outcome [eg, stillbirth], interval >10 years between pregnancies) No   The following portions of the patient's history were reviewed and updated as appropriate: allergies, current medications, past family history, past medical history, past social history, past surgical history and problem list. Problem list updated.  Objective:   Vitals:   04/19/20 1357  BP: 116/76  Pulse: 97  Temp: 97.9 F (36.6 C)  Weight: 201 lb 12.8 oz (91.5 kg)    Fetal Status:   Fundal Height: 30 cm Movement: Present      Physical Exam Vitals and nursing note reviewed.  Constitutional:      General: She is not in acute distress.    Appearance: Normal appearance. She is well-developed.  HENT:     Head: Normocephalic and atraumatic.     Right Ear: External ear normal.     Left Ear: External ear normal.     Nose: Nose normal. No congestion or rhinorrhea.     Mouth/Throat:     Lips: Pink.     Mouth: Mucous membranes are moist.     Dentition: Normal dentition. No dental caries.     Pharynx: Oropharynx is clear. Uvula midline.  Eyes:     General: No scleral icterus.  Conjunctiva/sclera: Conjunctivae normal.  Neck:     Thyroid: No thyroid mass or thyromegaly.  Cardiovascular:     Rate and Rhythm: Normal rate.     Pulses: Normal pulses.     Comments: Extremities are warm and well perfused Pulmonary:     Effort: Pulmonary effort is normal.     Breath sounds: Normal breath sounds.  Chest:     Chest wall: No mass.  Breasts:     Tanner Score is 5.  Breasts are symmetrical.     Right: Normal. No mass, nipple discharge, skin change or axillary adenopathy.     Left: Normal. No mass, nipple discharge, skin change or axillary adenopathy.    Abdominal:     Palpations: Abdomen is soft.     Tenderness: There is no abdominal tenderness.     Comments: Gravid, soft without masses, fundal height=30, FHR=150   Genitourinary:    General: Normal vulva.     Exam position: Lithotomy position.     Pubic Area: No rash.      Labia:        Right: No rash.        Left: No rash.      Vagina: Vaginal discharge (frothy white leukorrhea, ph<4.5) present.     Cervix: Normal.     Uterus: Normal. Enlarged (Gravid 30 wk size). Not tender.      Rectum: Normal. No external hemorrhoid.     Comments: Pap done Musculoskeletal:     Right lower leg: No edema.     Left lower leg: No edema.  Lymphadenopathy:     Upper Body:     Right upper body: No axillary adenopathy.     Left upper body: No axillary adenopathy.  Skin:    General: Skin is warm.     Capillary Refill: Capillary refill takes less than 2 seconds.  Neurological:     Mental Status: She is alert.     Assessment and Plan:  Pregnancy: G3P2002 at [redacted]w[redacted]d  1. Late prenatal care 29 wks   2. Overweight BMI=29   3. Supervision of high risk pregnancy in third trimester 1 hour glucola today Needs f/u u/s on 05/12/20 for f/u fetal pelviectasis--ordered  - HIV-1/HIV-2 Qualitative RNA - Prenatal profile without Varicella or Rubella - Varicella zoster antibody, IgG - HCV Ab w Reflex to Quant PCR - Urine Culture - Chlamydia/GC NAA, Confirmation - Glucose, 1 hour gestational - Lead, blood (adult age 80 yrs or greater) - 492010 Drug Screen - IGP, rfx Aptima HPV ASCU - WET PREP FOR TRICH, YEAST, CLUE - Hemoglobin, venipuncture - Urinalysis (Urine Dip)  4. Irritable bowel syndrome, unspecified type   5. history macrosomic infant 9 lb 3 oz 12/21/09   6. fetal prominent right rental pelvis on  04/14/20 u/s Needs f/u u/s in 4 wks--referral done    Discussed overview of care and coordination with inpatient delivery practices including [facility], [facility], [facility] and [facility].   Reviewed Centering pregnancy as standard of care at [facility], oriented to room and showed video. Based on EDD, plan for Cycle    Preterm labor symptoms and general obstetric precautions including but not limited to vaginal bleeding, contractions, leaking of fluid and fetal movement were reviewed in detail with the patient.  Please refer to After Visit Summary for other counseling recommendations.   Return in about 2 weeks (around 05/03/2020) for routine PNC.  No future appointments.  Alberteen Spindle, CNM

## 2020-04-20 ENCOUNTER — Telehealth: Payer: Self-pay

## 2020-04-20 DIAGNOSIS — O99810 Abnormal glucose complicating pregnancy: Secondary | ICD-10-CM | POA: Insufficient documentation

## 2020-04-20 LAB — CBC/D/PLT+RPR+RH+ABO+AB SCR
Antibody Screen: NEGATIVE
Basophils Absolute: 0 10*3/uL (ref 0.0–0.2)
Basos: 0 %
EOS (ABSOLUTE): 0 10*3/uL (ref 0.0–0.4)
Eos: 1 %
Hematocrit: 35.5 % (ref 34.0–46.6)
Hemoglobin: 11.9 g/dL (ref 11.1–15.9)
Hepatitis B Surface Ag: NEGATIVE
Immature Grans (Abs): 0 10*3/uL (ref 0.0–0.1)
Immature Granulocytes: 0 %
Lymphocytes Absolute: 1.1 10*3/uL (ref 0.7–3.1)
Lymphs: 25 %
MCH: 30.7 pg (ref 26.6–33.0)
MCHC: 33.5 g/dL (ref 31.5–35.7)
MCV: 92 fL (ref 79–97)
Monocytes Absolute: 0.2 10*3/uL (ref 0.1–0.9)
Monocytes: 5 %
Neutrophils Absolute: 2.9 10*3/uL (ref 1.4–7.0)
Neutrophils: 69 %
Platelets: 164 10*3/uL (ref 150–450)
RBC: 3.88 x10E6/uL (ref 3.77–5.28)
RDW: 13.4 % (ref 11.7–15.4)
RPR Ser Ql: NONREACTIVE
Rh Factor: POSITIVE
WBC: 4.2 10*3/uL (ref 3.4–10.8)

## 2020-04-20 LAB — URINALYSIS
Bilirubin, UA: NEGATIVE
Glucose, UA: NEGATIVE
Nitrite, UA: NEGATIVE
Protein,UA: NEGATIVE
RBC, UA: NEGATIVE
Specific Gravity, UA: 1.015 (ref 1.005–1.030)
Urobilinogen, Ur: 0.2 mg/dL (ref 0.2–1.0)
pH, UA: 6.5 (ref 5.0–7.5)

## 2020-04-20 LAB — WET PREP FOR TRICH, YEAST, CLUE
Trichomonas Exam: NEGATIVE
Yeast Exam: NEGATIVE

## 2020-04-20 LAB — LEAD, BLOOD (ADULT >= 16 YRS): Lead-Whole Blood: <1 ug/dL (ref 0–4)

## 2020-04-20 LAB — IGP, RFX APTIMA HPV ASCU: PAP Smear Comment: 0

## 2020-04-20 LAB — HIV-1/HIV-2 QUALITATIVE RNA
HIV-1 RNA, Qualitative: NONREACTIVE
HIV-2 RNA, Qualitative: NONREACTIVE

## 2020-04-20 LAB — HCV AB W REFLEX TO QUANT PCR: HCV Ab: <0.1 s/co ratio (ref 0.0–0.9)

## 2020-04-20 LAB — HEMOGLOBIN, FINGERSTICK: Hemoglobin: 12 g/dL (ref 11.1–15.9)

## 2020-04-20 LAB — HCV INTERPRETATION

## 2020-04-20 LAB — VARICELLA ZOSTER ANTIBODY, IGG: Varicella zoster IgG: 328 index (ref >165)

## 2020-04-20 LAB — GLUCOSE, 1 HOUR GESTATIONAL: Gestational Diabetes Screen: 172 mg/dL — ABNORMAL HIGH (ref 65–139)

## 2020-04-20 NOTE — Telephone Encounter (Signed)
Re: U/S appt [facility] : [facility] 05/12/20, [facility] entrance, 1:00 appt, arrive 12:45 with full bladder  Phone call to pt at [phone]. Left message on voicemail that U/S appt has been scheduled for 05/12/20 at [facility], go to [facility] entrance at 12:45, have full bladder.

## 2020-04-20 NOTE — Telephone Encounter (Signed)
Call to client and notified of [place] appt. Counseled regarding need for 3 hour GTT. Test prep instructions provided with stated understanding by client. appt scheduled for 04/24/2020. Jossie Ng, RN

## 2020-04-21 LAB — CHLAMYDIA/GC NAA, CONFIRMATION
Chlamydia trachomatis, NAA: NEGATIVE
Neisseria gonorrhoeae, NAA: NEGATIVE

## 2020-04-21 LAB — URINE CULTURE

## 2020-04-24 ENCOUNTER — Telehealth: Payer: Self-pay

## 2020-04-24 ENCOUNTER — Other Ambulatory Visit: Payer: Self-pay

## 2020-04-24 NOTE — Telephone Encounter (Signed)
Client [facility] as scheduled 04/24/2020 am in Nurse Clinic. Call to client at cell # and only static sound heard. Number redialed with same result (no ringing, no recorded message). Call to home number and left message to call and reschedule missed appt for 3 hour GTT. Number to call provided. Call to mother's phone and left message requesting assistance contacting client to call to reschedule missed appt. Jossie Ng, RN

## 2020-04-24 NOTE — Telephone Encounter (Signed)
Client rescheduled 3 hour GTT appt for 04/25/2020 per L. Marvis Moeller, [facility] intake clerk. Jossie Ng, RN

## 2020-04-25 ENCOUNTER — Other Ambulatory Visit: Payer: Self-pay

## 2020-04-25 ENCOUNTER — Other Ambulatory Visit: Payer: Medicaid Other

## 2020-04-25 DIAGNOSIS — O99810 Abnormal glucose complicating pregnancy: Secondary | ICD-10-CM | POA: Diagnosis not present

## 2020-04-25 MED ORDER — PROMETHAZINE HCL 25 MG PO TABS: 25.0000 mg | ORAL_TABLET | Freq: Every day | ORAL | 0 refills | Status: DC

## 2020-04-25 NOTE — Progress Notes (Signed)
Client vomiting large amount of yellow fluid in trash can in [facility] Room #3. Hazle Coca CNM aware. 3 hour GTT order cancelled and per verbal order of Ms. Sciora,, changed to random fasting glucose. [facility] lab notified of change. Phenergan 25 mg prescription handwritten by Ms. Sciora and client to take one tablet at hs 04/26/20 pm and another when leaves home for 3 hour GTT on 04/27/2020. Client counseled to only take Phenergan 04/27/2020 am if has someone to drive her to clinic and she voiced understanding of instructions. 3 hour GTT appt reminder card for 04/27/2020 at 8:00 am given to client. Jossie Ng, RN

## 2020-04-25 NOTE — Progress Notes (Signed)
Consulted on the plan of care for this client.  I agree with the documented note and actions taken to provide care for this client.  E. Junella Domke, CNM  

## 2020-04-25 NOTE — Progress Notes (Signed)
In Nurse Clinic for 3 hr GTT. Reports no travel since last visit. Npo since 8 pm last night (04/24/20). Instructions given. Pt states she is "shaky" and doesn't feel well. Denies nausea. RN escorted pt in wheelchair to [facility] exam room to lay down. [facility] nurses aware that pt will be waiting in exam room while she is here for 3 hr GTT. Jerel Shepherd, RN

## 2020-04-26 LAB — GLUCOSE, FASTING: Glucose, Plasma: 78 mg/dL (ref 65–99)

## 2020-04-27 ENCOUNTER — Other Ambulatory Visit: Payer: Self-pay

## 2020-04-28 ENCOUNTER — Telehealth: Payer: Self-pay

## 2020-04-28 NOTE — Telephone Encounter (Signed)
DNKA for 3 hour GTT 04/27/2020. Call to client to reschedule appt and left message to call with number to call provided. Jossie Ng, RN

## 2020-05-01 NOTE — Telephone Encounter (Signed)
Call to client and left message on voicemail to reschedule missed appt for 3 hour GTT. Call to mother's number and able to speak with client. Client had 05/03/2020 [facility] RV appt which was changed to am tomorrow with 3 hour GTT. Client able to verbalized to RN correct test prep instructions. Jossie Ng, RN

## 2020-05-02 ENCOUNTER — Ambulatory Visit: Payer: Self-pay

## 2020-05-03 ENCOUNTER — Ambulatory Visit: Payer: Self-pay

## 2020-05-04 ENCOUNTER — Encounter: Payer: Self-pay | Admitting: Family Medicine

## 2020-05-04 ENCOUNTER — Ambulatory Visit: Payer: Self-pay

## 2020-05-04 ENCOUNTER — Telehealth: Payer: Self-pay | Admitting: Family Medicine

## 2020-05-04 ENCOUNTER — Telehealth: Payer: Self-pay

## 2020-05-04 ENCOUNTER — Other Ambulatory Visit: Payer: Self-pay | Admitting: Physician Assistant

## 2020-05-04 DIAGNOSIS — O99810 Abnormal glucose complicating pregnancy: Secondary | ICD-10-CM

## 2020-05-04 LAB — 789231 7+OXYCODONE-BUND
Amphetamines, Urine: NEGATIVE ng/mL
BENZODIAZ UR QL: NEGATIVE ng/mL
Barbiturate screen, urine: NEGATIVE ng/mL
Cocaine (Metab.): NEGATIVE ng/mL
OPIATE SCREEN URINE: NEGATIVE ng/mL
Oxycodone/Oxymorphone, Urine: NEGATIVE ng/mL
PCP Quant, Ur: NEGATIVE ng/mL

## 2020-05-04 LAB — CANNABINOID CONFIRMATION, UR
CANNABINOIDS: POSITIVE — AB
Carboxy THC GC/MS Conf: 56 ng/mL

## 2020-05-04 MED ORDER — PROMETHAZINE HCL 25 MG PO TABS: 25.0000 mg | ORAL_TABLET | Freq: Every day | ORAL | 0 refills | Status: DC

## 2020-05-04 NOTE — Progress Notes (Unsigned)
[facility] Department Maternity Care Conference  Maternity Care Conference Date: 05/04/20  Kylie [name] was identified by clinical staff to benefit from an interdisciplinary team approach to help improve pregnancy care.  The [facility] Maternity Care Conference includes the maternity clinic coordinator (RN), medical providers (MD/APP staff), Care Management -OBCM and Healthy Beginnings, Centering Pregnancy coordinator, Infant Mortality reduction Dietitian.  Nursing staff are also encouraged to participate. The group meets monthly to discuss patient care and coordinate services.   The patient's care care at the agency was reviewed in EMR and high risk factors evaluated in an interdisciplinary approach.    Value added interventions discussed at this care conference today were:   - Positive marijuana drug screen, most recent.  - Pt cancelled appointment for 3-hour.   - Pt will be contacted by Arlana Hove re Phenergan that pt requests to take 3-hour.     [facility]

## 2020-05-04 NOTE — Telephone Encounter (Signed)
PATIENT NEEDS TO RESCHEDULE FOLLOW UP APT, BUT DOESN'T WANT TO DO THE 3HR GLUCOSE TEST, BECAUSE SHE WILL GET SICK AND THROW UP AND PROBABLY GET SENT HOME AGAIN. PLEASE CALLBACK TO DISCUSS THIS.

## 2020-05-04 NOTE — Telephone Encounter (Signed)
Call to client and encouraged 3 hour GTT tomorrow. Client aware Phenergan tablets x2 being prescribed today and to take one at bedtime tonight and to take one at 7:30 am tomorrow. Client has someone to drive her to appt in am. Client is agreeable to re-attempting to do 3 hour GTT. Jossie Ng, RN

## 2020-05-04 NOTE — Telephone Encounter (Signed)
Client cancelled appt for RV with 3 hour GTT today. Appt for RV rescheduled for tomorrow. Per client, earlier this week scheduled for 3 hour GTT and took 2 doses of Phenergan as directed. Client states there was a school delay and had issues getting kids to school so [facility] for 3 hour GTT and now has no nausea medicine and concerned about being sick. Questioning if should do the test and RN counseled her on importance of test.  RV appt scheduled for tomorrow. Jossie Ng, RN

## 2020-05-05 ENCOUNTER — Telehealth: Payer: Self-pay

## 2020-05-05 ENCOUNTER — Ambulatory Visit: Payer: Self-pay

## 2020-05-05 ENCOUNTER — Other Ambulatory Visit: Payer: Self-pay

## 2020-05-05 ENCOUNTER — Ambulatory Visit: Payer: Medicaid Other | Admitting: Advanced Practice Midwife

## 2020-05-05 VITALS — BP 129/79 | HR 95 | Wt 204.0 lb

## 2020-05-05 DIAGNOSIS — O0993 Supervision of high risk pregnancy, unspecified, third trimester: Secondary | ICD-10-CM | POA: Diagnosis not present

## 2020-05-05 NOTE — Progress Notes (Signed)
Patient here for MH RV at 31 2/7. Patient also here for 3 hour gtt. Patient states she last ate and drank at 8pm last night, and that she took phenergan last night and also this morning before she came. Patient sent to lab to start testing and counseled that we will see her in clinic in between blood draws. Burt Knack, RN

## 2020-05-05 NOTE — Telephone Encounter (Signed)
Client with [facility] RV appt and 3 hour GTT this am. Per client, vomited with 3 hour GTT and left building so RV not don. RN called client and reschedule RV for this pm, but client [facility]. Call to client to reschedule RV and left message to call with number to call provided. Call to mother at [phone] and left message requesting assistance contacting client to call [facility] for [facility] appt. Number to call provided. Jossie Ng, RN

## 2020-05-05 NOTE — Progress Notes (Signed)
Patient unable to be found at the time of her 9:30 blood draw. TC call to patient who states she told someone that she had thrown up and was leaving. Patient states she went and sat in her car because she thought she was going to "pass out". She states she is still feeling bad and is at home. Patient counseled she still needs MH RV appointment and agrees to come back for that appointment this afternoon. Patient scheduled for revisit at 1:40pm today.Marland KitchenMarland KitchenBurt Knack, RN

## 2020-05-06 LAB — GLUCOSE, FASTING: Glucose, Plasma: 76 mg/dL (ref 65–99)

## 2020-05-08 ENCOUNTER — Other Ambulatory Visit: Payer: Self-pay

## 2020-05-08 ENCOUNTER — Ambulatory Visit (INDEPENDENT_AMBULATORY_CARE_PROVIDER_SITE_OTHER): Payer: Medicaid Other | Admitting: Obstetrics

## 2020-05-08 VITALS — BP 118/74 | Wt 206.0 lb

## 2020-05-08 DIAGNOSIS — O0993 Supervision of high risk pregnancy, unspecified, third trimester: Secondary | ICD-10-CM | POA: Diagnosis not present

## 2020-05-08 DIAGNOSIS — Z3A31 31 weeks gestation of pregnancy: Secondary | ICD-10-CM

## 2020-05-08 DIAGNOSIS — O24410 Gestational diabetes mellitus in pregnancy, diet controlled: Secondary | ICD-10-CM

## 2020-05-08 NOTE — Progress Notes (Signed)
  Routine Prenatal Care Visit  Subjective  Kylie [name] is a 30 y.o. G3P2002 at [redacted]w[redacted]d being seen today for ongoing prenatal care.  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  ----------------------------------------------------------------------------------- Patient reports no bleeding, no contractions, no cramping, no leaking and and she is transferring her care from the [facility]. She has been non compliant there- having missed a 3 hr GTT after hr elelvated 1 hr. Today reports good fetal movement. agrees to be con sidered a  GDM and start on monitoring.   Contractions: Not present. Vag. Bleeding: None.   . Leaking Fluid denies.  ----------------------------------------------------------------------------------- The following portions of the patient's history were reviewed and updated as appropriate: allergies, current medications, past family history, past medical history, past social history, past surgical history and problem list. Problem list updated.  Objective  Blood pressure 118/74, weight 206 lb (93.4 kg), last menstrual period 10/08/2019, unknown if currently breastfeeding. Pregravid weight 190 lb (86.2 kg) Total Weight Gain 16 lb (7.258 kg) Urinalysis: Urine Protein    Urine Glucose    Fetal Status:           General:  Alert, oriented and cooperative. Patient is in no acute distress.  Skin: Skin is warm and dry. No rash noted.    Cardiovascular: Normal heart rate noted  Respiratory: Normal respiratory effort, no problems with respiration noted  Abdomen: Soft, gravid, appropriate for gestational age. Pain/Pressure: Absent     Pelvic:  Cervical exam deferred        Extremities: Normal range of motion.     Mental Status: Normal mood and affect. Normal behavior. Normal judgment and thought content.   Assessment   30 y.o. [id number] at [redacted]w[redacted]d by  07/05/2020, by Ultrasound presenting for routine prenatal visit Gestational diabetes ( will forgo the 3 hr GTT)- needing [facility] referral and to begin glucose monitoring. Desires BTL- will need to sign consents. Plan   Pregnancy 2021 Problems (from 04/19/20 to present)    Problem Noted Resolved   Abnormal glucose affecting pregnancy 04/20/2020 by Burt Knack, RN No   Overview Addendum 05/01/2020  4:29 PM by Alberteen Spindle, CNM    Abnormal 1 hour gtt = 172, 04/19/2020. [x  ] Needs 3 hour gtt. Scheduled for 04/24/2020--DNKA Rescheduled for 04/25/20 and vomited after drinking glucola. Rescheduled for 05/02/20      Previous Version       Preterm labor symptoms and general obstetric precautions including but not limited to vaginal bleeding, contractions, leaking of fluid and fetal movement were reviewed in detail with the patient. Please refer to After Visit Summary for other counseling recommendations.   Return in about 2 weeks (around 05/22/2020) for return OB, growth sono and MD visit please.  Mirna Mires, CNM  05/08/2020 11:07 AM

## 2020-05-08 NOTE — Telephone Encounter (Signed)
Phone call to pt. Left message on voicemail at [phone] and also at [phone] that [facility] is calling to reschedule [facility] to test sugar.

## 2020-05-08 NOTE — Telephone Encounter (Signed)
Client needs RV (and 3 hour GTT). Client has had 2 unsuccessful 3 hour GTT attempts and missed RV last week. Call to client and left message requesting she call to reschedule missed [facility] RV appt. Number to call provided. Call to mother [phone]) and left message requesting assistance contacting client to call and schedule appt in maternity clinic. Call to father  [phone]) and per recorded message, voicemail box is full. Jossie Ng, RN

## 2020-05-08 NOTE — Progress Notes (Signed)
NOB transfer of care from [facility]. No vb. No lof.

## 2020-05-09 ENCOUNTER — Other Ambulatory Visit: Payer: Self-pay | Admitting: Family Medicine

## 2020-05-09 DIAGNOSIS — O99810 Abnormal glucose complicating pregnancy: Secondary | ICD-10-CM

## 2020-05-09 MED ORDER — ACCU-CHEK SOFTCLIX LANCETS MISC: 1.0000 | Freq: Four times a day (QID) | 12 refills | Status: DC

## 2020-05-09 MED ORDER — ACCU-CHEK SMARTVIEW VI STRP
ORAL_STRIP | 12 refills | Status: DC
Start: 2020-05-09 — End: 2020-06-29

## 2020-05-09 MED ORDER — ACCU-CHEK NANO SMARTVIEW W/DEVICE KIT: 1.0000 | PACK | 0 refills | Status: DC

## 2020-05-10 LAB — DRUG SCREEN, URINE
Amphetamines, Urine: NEGATIVE ng/mL
Barbiturate screen, urine: NEGATIVE ng/mL
Benzodiazepine Quant, Ur: NEGATIVE ng/mL
Cannabinoid Quant, Ur: POSITIVE ng/mL — AB
Cocaine (Metab.): NEGATIVE ng/mL
Opiate Quant, Ur: NEGATIVE ng/mL
PCP Quant, Ur: NEGATIVE ng/mL

## 2020-05-10 NOTE — Telephone Encounter (Signed)
Client has self transferred prenatal care to [facility] with first appt completed 05/08/2020. Jossie Ng, RN

## 2020-05-10 NOTE — Telephone Encounter (Signed)
Per Dr. Alvester Morin, she has e-prescribed blood glucose testing supplies for client and she needs to check blood sugar fasting and 1 hour after meals for a week. She has texted client this info. Client needs to bring supplies to a RV appt for provider teaching. Call to client this am regarding above. Left message to call with number to call provided. Jossie Ng, RN

## 2020-05-11 ENCOUNTER — Other Ambulatory Visit: Payer: Self-pay | Admitting: Obstetrics and Gynecology

## 2020-05-11 ENCOUNTER — Ambulatory Visit: Payer: Medicaid Other | Admitting: *Deleted

## 2020-05-12 ENCOUNTER — Ambulatory Visit: Admission: RE | Admit: 2020-05-12 | Payer: Medicaid Other | Source: Ambulatory Visit

## 2020-05-16 ENCOUNTER — Ambulatory Visit (INDEPENDENT_AMBULATORY_CARE_PROVIDER_SITE_OTHER): Payer: Medicaid Other

## 2020-05-16 ENCOUNTER — Ambulatory Visit (INDEPENDENT_AMBULATORY_CARE_PROVIDER_SITE_OTHER): Payer: Medicaid Other | Admitting: Obstetrics and Gynecology

## 2020-05-16 ENCOUNTER — Encounter: Payer: Self-pay | Admitting: Obstetrics and Gynecology

## 2020-05-16 ENCOUNTER — Other Ambulatory Visit: Payer: Self-pay

## 2020-05-16 VITALS — BP 108/70 | Ht 70.0 in | Wt 205.2 lb

## 2020-05-16 DIAGNOSIS — O24410 Gestational diabetes mellitus in pregnancy, diet controlled: Secondary | ICD-10-CM

## 2020-05-16 DIAGNOSIS — O0993 Supervision of high risk pregnancy, unspecified, third trimester: Secondary | ICD-10-CM | POA: Diagnosis not present

## 2020-05-16 DIAGNOSIS — Z3A32 32 weeks gestation of pregnancy: Secondary | ICD-10-CM | POA: Diagnosis not present

## 2020-05-16 DIAGNOSIS — Z3A31 31 weeks gestation of pregnancy: Secondary | ICD-10-CM

## 2020-05-16 LAB — POCT URINALYSIS DIPSTICK OB
Glucose, UA: NEGATIVE
POC,PROTEIN,UA: NEGATIVE

## 2020-05-16 LAB — GLUCOSE, POCT (MANUAL RESULT ENTRY): POC Glucose: 123 mg/dl — AB (ref 70–99)

## 2020-05-16 MED ORDER — ACCU-CHEK GUIDE VI STRP: ORAL_STRIP | 12 refills | Status: DC

## 2020-05-16 MED ORDER — ACCU-CHEK GUIDE ME W/DEVICE KIT: 1.0000 | PACK | Freq: Four times a day (QID) | 0 refills | Status: DC

## 2020-05-16 NOTE — Patient Instructions (Signed)
Gestational Diabetes Mellitus, Self-Care Caring for yourself after a diagnosis of gestational diabetes mellitus means keeping your blood sugar under control. This can be done through nutrition, exercise, lifestyle changes, insulin and other medicines, and support from your health care team. Your health care provider will set individualized treatment goals for you. What are the risks? If left untreated, gestational diabetes can cause problems for mother and baby. For the mother Women who get gestational diabetes are more likely to:  Have labor induced and deliver early.  Have problems during labor and delivery, if the baby is larger than normal. This includes difficult labor and damage to the birth canal.  Have a cesarean delivery.  Have problems with blood pressure, including high blood pressure and preeclampsia.  Get it again if they become pregnant.  Develop type 2 diabetes in the future. For the baby Gestational diabetes that is not treated can cause the baby to have:  Low blood glucose (hypoglycemia).  Larger-than-normal body size (macrosomia).  Breathing problems. How to monitor blood glucose Check your blood glucose every day and as often as told by your health care provider. To do this: 1. Wash your hands with soap and water for at least 20 seconds. 2. Prick the side of your finger (not the tip) with the lancet. Use a different finger each time. 3. Gently rub the finger until a small drop of blood appears. 4. Follow instructions that come with your meter for inserting the test strip, applying blood to the strip, and getting the result. 5. Write down your result and any notes. Blood glucose goals are:  95 mg/dL (5.3 mmol/L) when fasting.  140 mg/dL (7.8 mmol/L) 1 hour after a meal.  120 mg/dL (6.7 mmol/L) 2 hours after a meal.   Follow these instructions at home: Medicines  Take over-the-counter and prescription medicines only as told by your health care  provider.  If your health care provider prescribed insulin or other diabetes medicines: ? Take them every day. ? Do not run out of insulin or other medicines. Plan ahead so you always have them available. Eating and drinking  Follow instructions from your health care provider about eating or drinking restrictions.  See a diet and nutrition expert (registered dietician) to help you create an eating plan that helps control your diabetes. The foods in this plan will include: ? Lean proteins. ? Complex carbohydrates. These are carbohydrates that contain fiber, have a lot of nutrients, and are digested slowly. They include dried beans, nuts, and whole grain breads, cereals, or pasta. ? Fresh fruits and vegetables. ? Low-fat dairy products. ? Healthy fats.  Eat healthy snacks between nutritious meals.  Drink enough fluid to keep your urine pale yellow.  Keep a record of the carbohydrates that you eat. To do this: ? Read food labels. ? Learn the standard serving sizes of foods.  Make a sick day plan with your health care provider before you get sick. Follow this plan whenever you cannot eat or drink as usual.   Activity  Do exercises as told by your health care provider.  Do 30 or more minutes of physical activity a day, or as much physical activity as your health care provider recommends. It may help to control blood glucose levels after a meal if you: ? Do 10 minutes of exercise after each meal. ? Start this exercise 30 minutes after the meal.  If you start a new exercise or activity, work with your health care provider to adjust your   insulin, other medicines, or food as needed. Lifestyle  Do not drink alcohol.  Do not use any products that contain nicotine or tobacco, such as cigarettes, e-cigarettes, and chewing tobacco. If you need help quitting, ask your health care provider.  Learn to manage stress. If you need help with this, ask your health care provider. Body care  Keep  your vaccines up to date.  Practice good oral hygiene. To do this: ? Clean your teeth and gums two times a day. ? Floss one or more times a day. ? Visit your dentist one or more times every 6 months.  Stay at a healthy weight while you are pregnant. Your expected weight gain depends on your BMI (body mass index) before pregnancy. General instructions  Talk with your health care provider about the risk for high blood pressure during pregnancy (preeclampsia and eclampsia).  Share your diabetes management plan with people in your workplace, school, and household.  Check your urine for ketones when sick and as told by your health care provider. Ketones are made by the liver when a lack of glucose forces the body to use fat for energy.  Carry a medical alert card or wear medical alert jewelry that says you have gestational diabetes.  Keep all follow-up visits. This is important. Get care after delivery  Have your blood glucose level checked with an oral glucose tolerance test (OGTT) 4-12 weeks after delivery.  Get screened for diabetes at least every 3 years, or as often as told by your health care provider. Where to find more information  American Diabetes Association (ADA): diabetes.org  Association of Diabetes Care & Education Specialists (ADCES): diabeteseducator.org  Centers for Disease Control and Prevention (CDC): cdc.gov  American Pregnancy Association: americanpregnancy.org  [place] Department of Agriculture MyPlate: myplate.gov Contact a health care provider if:  Your blood glucose is above your target for two tests in a row.  You have a fever.  You have been sick for 2 days or more and are not getting better.  You have either of these problems for more than 6 hours: ? Vomiting every time you eat or drink. ? Diarrhea. Get help right away if you:  Become confused or cannot think clearly.  Have trouble breathing.  Have moderate or high ketones in your  urine.  Feel your baby is not moving as usual.  Develop unusual discharge or bleeding from your vagina.  Start having early (premature) contractions. Contractions may feel like a tightening in your lower abdomen  Have a severe headache. These symptoms may represent a serious problem that is an emergency. Do not wait to see if the symptoms will go away. Get medical help right away. Call your local emergency services (911 in the [place] Do not drive yourself to the hospital. Summary  Check your blood glucose every day during your pregnancy. Do this as often as told by your health care provider.  Take insulin or other diabetes medicines every day, if your health care provider prescribed them.  Have your blood glucose level checked 4-12 weeks after delivery.  Keep all follow-up visits. This is important. This information is not intended to replace advice given to you by your health care provider. Make sure you discuss any questions you have with your health care provider. Document Revised: 08/16/2019 Document Reviewed: 08/16/2019 Elsevier Patient Education  2021 Elsevier Inc.  

## 2020-05-16 NOTE — Progress Notes (Signed)
Routine Prenatal Care Visit  Subjective  Kylie [name] is a 30 y.o. G3P2002 at [redacted]w[redacted]d being seen today for ongoing prenatal care.  She is currently monitored for the following issues for this high-risk pregnancy and has Anxiety associated with depression dx'd age 89; Urinary tract infection without hematuria 03/14/21 in ER; COVID-19 affecting pregnancy in third trimester 04/14/20; Late prenatal care 29 wks; Overweight BMI=29; Supervision of high risk pregnancy in third trimester; Irritable bowel syndrome dx'd age 46; history macrosomic infant 9 lb 3 oz 12/21/09; fetal prominent right rental pelvis on 04/14/20 u/s; Marijuana use with +UDS 02/28/20 ER; +UDS MJ 04/19/20; and Abnormal glucose affecting pregnancy on their problem list.  ----------------------------------------------------------------------------------- Patient reports no complaints.   Contractions: Not present. Vag. Bleeding: None.  Movement: Present. Denies leaking of fluid.  ----------------------------------------------------------------------------------- The following portions of the patient's history were reviewed and updated as appropriate: allergies, current medications, past family history, past medical history, past social history, past surgical history and problem list. Problem list updated.   Objective  Blood pressure 108/70, height 5\' 10"  (1.778 m), weight 205 lb 3.2 oz (93.1 kg), last menstrual period 10/08/2019, unknown if currently breastfeeding. Pregravid weight 190 lb (86.2 kg) Total Weight Gain 15 lb 3.2 oz (6.895 kg) Urinalysis:      Fetal Status: Fetal Heart Rate (bpm): 138   Movement: Present     General:  Alert, oriented and cooperative. Patient is in no acute distress.  Skin: Skin is warm and dry. No rash noted.   Cardiovascular: Normal heart rate noted  Respiratory: Normal respiratory effort, no problems with respiration noted  Abdomen: Soft, gravid, appropriate for gestational age. Pain/Pressure: Absent      Pelvic:  Cervical exam deferred        Extremities: Normal range of motion.  Edema: None  Mental Status: Normal mood and affect. Normal behavior. Normal judgment and thought content.     Assessment   30 y.o. G3P2002 at [redacted]w[redacted]d by  07/05/2020, by Ultrasound presenting for routine prenatal visit  Plan   Pregnancy 2021 Problems (from 04/19/20 to present)    Problem Noted Resolved   Abnormal glucose affecting pregnancy 04/20/2020 by 04/22/2020, RN No   Overview Addendum 05/10/2020  9:25 AM by 05/12/2020, CNM    Abnormal 1 hour gtt = 172, 04/19/2020. [x  ] Needs 3 hour gtt. Scheduled for 04/24/2020--DNKA Rescheduled for 04/25/20 and vomited after drinking glucola. Rescheduled for 05/02/20 and vomited after drinking glucola Pt self transferred care to [name] with first apt there 05/08/20      Previous Version   Supervision of high risk pregnancy in third trimester 04/19/2020 by 04/21/2020, CNM No   Overview Addendum 05/16/2020 10:23 AM by 05/18/2020, MD     Nursing Staff Provider  Office Location [facility] Dating  12/30/19=13 1/7  Language  English Anatomy 3/7  04/14/20- mild pelviectasis  Flu Vaccine   declined 04/19/20 Genetic Screen  None  TDaP vaccine    declined 04/19/20 Hgb A1C or  GTT Third trimester : elevated 1 hr (172) unable to complete 3hr, presumptive GDM  COVID vaccine  declined 04/19/20    Rhogam   not needed   LAB RESULTS   Feeding Plan  Breast Blood Type  O+,      04/19/2020  Contraception  desires BTL Antibody Negative,     04/19/2020  Circumcision  Rubella 1.28 (12/22 2132)  Pediatrician   unsure 04/19/20 RPR Non-reactive,  04/19/2020  Support Person  HBsAg Negative,     04/19/2020  Prenatal Classes  HIV Non-reactive,    04/19/2020  @28wk - Doula referral?  Varicella  immune           (04/19/2020)    HCV   negative         (04/19/2020)  BTL Consent  GBS  (For PCN allergy, check sensitivities)        VBAC Consent  Pap  2022 NIL    Hgb Electro     BP Cuff ordered  CF   Delivery Group  [facility]   Centering Group  OBCM involved     High risk Pregnancy Diagnoses Late prenatal care Mild fetal pelviectasis Gestational Diabetes History of fetal macrosomia- 9lbs 3oz   NSTs:   Delivery:          Previous Version        Tubal papers  Growth 2023: Growth percentile is 82.5.  AC percentile is >97.7. EFW: 2,673g (5lbs 14oz) Patient did not bring a log. Reported that her insurance did not cover prior prescription.  Random glucose 127. Assuming poor control start weekly NSTs.  I called her pharmacy and corrected the glucose meter prescription. Encouraged patient to keep a log. Given paper log.    Gestational age appropriate obstetric precautions including but not limited to vaginal bleeding, contractions, leaking of fluid and fetal movement were reviewed in detail with the patient.    Return in about 1 week (around 05/23/2020) for ROB with MD, NST.  07/23/2020 MD [facility], [facility] 05/16/2020, 10:29 AM

## 2020-05-17 ENCOUNTER — Ambulatory Visit: Payer: Medicaid Other | Admitting: *Deleted

## 2020-05-23 ENCOUNTER — Encounter: Payer: Self-pay | Admitting: Obstetrics and Gynecology

## 2020-05-23 ENCOUNTER — Other Ambulatory Visit: Payer: Self-pay

## 2020-05-23 ENCOUNTER — Ambulatory Visit (INDEPENDENT_AMBULATORY_CARE_PROVIDER_SITE_OTHER): Payer: Medicaid Other | Admitting: Obstetrics and Gynecology

## 2020-05-23 VITALS — BP 118/74 | Wt 205.0 lb

## 2020-05-23 DIAGNOSIS — O0993 Supervision of high risk pregnancy, unspecified, third trimester: Secondary | ICD-10-CM | POA: Diagnosis not present

## 2020-05-23 DIAGNOSIS — O99810 Abnormal glucose complicating pregnancy: Secondary | ICD-10-CM

## 2020-05-23 DIAGNOSIS — O24410 Gestational diabetes mellitus in pregnancy, diet controlled: Secondary | ICD-10-CM | POA: Diagnosis not present

## 2020-05-23 DIAGNOSIS — Z3A33 33 weeks gestation of pregnancy: Secondary | ICD-10-CM

## 2020-05-23 DIAGNOSIS — O98513 Other viral diseases complicating pregnancy, third trimester: Secondary | ICD-10-CM

## 2020-05-23 DIAGNOSIS — O24419 Gestational diabetes mellitus in pregnancy, unspecified control: Secondary | ICD-10-CM | POA: Insufficient documentation

## 2020-05-23 DIAGNOSIS — U071 COVID-19: Secondary | ICD-10-CM

## 2020-05-23 NOTE — Progress Notes (Signed)
Routine Prenatal Care Visit  Subjective  Kylie [name] is a 30 y.o. G3P2002 at [redacted]w[redacted]d being seen today for ongoing prenatal care.  She is currently monitored for the following issues for this high-risk pregnancy and has Anxiety associated with depression dx'd age 24; Urinary tract infection without hematuria 03/14/21 in ER; COVID-19 affecting pregnancy in third trimester 04/14/20; Late prenatal care 29 wks; Overweight BMI=29; Supervision of high risk pregnancy in third trimester; Irritable bowel syndrome dx'd age 79; history macrosomic infant 9 lb 3 oz 12/21/09; fetal prominent right rental pelvis on 04/14/20 u/s; Marijuana use with +UDS 02/28/20 ER; +UDS MJ 04/19/20; Abnormal glucose affecting pregnancy; and Gestational diabetes mellitus, currently pregnant on their problem list.  ----------------------------------------------------------------------------------- Patient reports no complaints.   Contractions: Irritability. Vag. Bleeding: None.  Movement: Present. Leaking Fluid denies.  GDM: brings nearly complete BG log. Most values normal. Only one fasting abnormal value (98). She did have one lunchtime abnormal value.  ----------------------------------------------------------------------------------- The following portions of the patient's history were reviewed and updated as appropriate: allergies, current medications, past family history, past medical history, past social history, past surgical history and problem list. Problem list updated.  Objective  Blood pressure 118/74, weight 205 lb (93 kg), last menstrual period 10/08/2019, unknown if currently breastfeeding. Pregravid weight 190 lb (86.2 kg) Total Weight Gain 15 lb (6.804 kg) Urinalysis: Urine Protein    Urine Glucose    Fetal Status: Fetal Heart Rate (bpm): 140   Movement: Present     General:  Alert, oriented and cooperative. Patient is in no acute distress.  Skin: Skin is warm and dry. No rash noted.   Cardiovascular: Normal heart  rate noted  Respiratory: Normal respiratory effort, no problems with respiration noted  Abdomen: Soft, gravid, appropriate for gestational age. Pain/Pressure: Present     Pelvic:  Cervical exam deferred        Extremities: Normal range of motion.  Edema: None  Mental Status: Normal mood and affect. Normal behavior. Normal judgment and thought content.   NST: Baseline FHR: 140 beats/min Variability: moderate Accelerations: present Decelerations: absent Tocometry: not done  Interpretation:  INDICATIONS: gestational diabetes mellitus RESULTS:  A NST procedure was performed with FHR monitoring and a normal baseline established, appropriate time of 20-40 minutes of evaluation, and accels >2 seen w 15x15 characteristics.  Results show a REACTIVE NST.    Assessment   30 y.o. G3P2002 at [redacted]w[redacted]d by  07/05/2020, by Ultrasound presenting for routine prenatal visit  Plan   Pregnancy 2021 Problems (from 04/19/20 to present)    Problem Noted Resolved   Gestational diabetes mellitus, currently pregnant 05/23/2020 by Conard Novak, MD No   Abnormal glucose affecting pregnancy 04/20/2020 by Burt Knack, RN No   Overview Addendum 05/10/2020  9:25 AM by Alberteen Spindle, CNM    Abnormal 1 hour gtt = 172, 04/19/2020. [x  ] Needs 3 hour gtt. Scheduled for 04/24/2020--DNKA Rescheduled for 04/25/20 and vomited after drinking glucola. Rescheduled for 05/02/20 and vomited after drinking glucola Pt self transferred care to [facility] with first apt there 05/08/20      Previous Version   Supervision of high risk pregnancy in third trimester 04/19/2020 by Alberteen Spindle, CNM No   Overview Addendum 05/16/2020 10:23 AM by Natale Milch, MD     Nursing Staff Provider  Office Location [facility] Dating  12/30/19=13 1/7  Language  English Anatomy US  04/14/20- mild pelviectasis  Flu Vaccine   declined 04/19/20 Genetic Screen  None  TDaP vaccine    declined 04/19/20 Hgb A1C or  GTT Third trimester :  elevated 1 hr (172) unable to complete 3hr, presumptive GDM  COVID vaccine  declined 04/19/20    Rhogam   not needed   LAB RESULTS   Feeding Plan  Breast Blood Type  O+,      04/19/2020  Contraception  desires BTL Antibody Negative,     04/19/2020  Circumcision  Rubella 1.28 (12/22 2132)  Pediatrician   unsure 04/19/20 RPR Non-reactive,     04/19/2020  Support Person  HBsAg Negative,     04/19/2020  Prenatal Classes  HIV Non-reactive,    04/19/2020  @28wk - Doula referral?  Varicella  immune           (04/19/2020)    HCV   negative         (04/19/2020)  BTL Consent  GBS  (For PCN allergy, check sensitivities)        VBAC Consent  Pap  2022 NIL    Hgb Electro    BP Cuff ordered  CF   Delivery Group  [facility] S[facility]   Centering Group  OBCM involved     High risk Pregnancy Diagnoses Late prenatal care Mild fetal pelviectasis Gestational Diabetes History of fetal macrosomia- 9lbs 3oz   NSTs:   Delivery:          Previous Version       Preterm labor symptoms and general obstetric precautions including but not limited to vaginal bleeding, contractions, leaking of fluid and fetal movement were reviewed in detail with the patient. Please refer to After Visit Summary for other counseling recommendations.   - continue to monitor BG values.   Return in about 1 week (around 05/30/2020) for Routine Prenatal Appointment w NST, MD only.   07/30/2020, MD, [facility], [facility] 05/23/2020 11:04 AM

## 2020-05-24 ENCOUNTER — Encounter: Payer: Self-pay | Admitting: Obstetrics and Gynecology

## 2020-05-24 ENCOUNTER — Other Ambulatory Visit: Payer: Self-pay | Admitting: Obstetrics

## 2020-05-24 DIAGNOSIS — O24410 Gestational diabetes mellitus in pregnancy, diet controlled: Secondary | ICD-10-CM

## 2020-05-24 DIAGNOSIS — O0993 Supervision of high risk pregnancy, unspecified, third trimester: Secondary | ICD-10-CM

## 2020-05-30 ENCOUNTER — Encounter: Payer: Medicaid Other | Admitting: Obstetrics & Gynecology

## 2020-06-01 ENCOUNTER — Other Ambulatory Visit: Payer: Self-pay

## 2020-06-01 ENCOUNTER — Ambulatory Visit (INDEPENDENT_AMBULATORY_CARE_PROVIDER_SITE_OTHER): Payer: Medicaid Other | Admitting: Obstetrics and Gynecology

## 2020-06-01 VITALS — BP 110/68 | Wt 210.0 lb

## 2020-06-01 DIAGNOSIS — F1910 Other psychoactive substance abuse, uncomplicated: Secondary | ICD-10-CM

## 2020-06-01 DIAGNOSIS — R9389 Abnormal findings on diagnostic imaging of other specified body structures: Secondary | ICD-10-CM

## 2020-06-01 DIAGNOSIS — O24410 Gestational diabetes mellitus in pregnancy, diet controlled: Secondary | ICD-10-CM

## 2020-06-01 DIAGNOSIS — O0993 Supervision of high risk pregnancy, unspecified, third trimester: Secondary | ICD-10-CM

## 2020-06-01 DIAGNOSIS — O99320 Drug use complicating pregnancy, unspecified trimester: Secondary | ICD-10-CM | POA: Diagnosis not present

## 2020-06-01 DIAGNOSIS — Z3A35 35 weeks gestation of pregnancy: Secondary | ICD-10-CM

## 2020-06-01 DIAGNOSIS — O99810 Abnormal glucose complicating pregnancy: Secondary | ICD-10-CM

## 2020-06-01 LAB — FETAL NONSTRESS TEST

## 2020-06-01 NOTE — Progress Notes (Signed)
Routine Prenatal Care Visit  Subjective  Kylie [name] is a 30 y.o. G3P2002 at [redacted]w[redacted]d being seen today for ongoing prenatal care.  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  ----------------------------------------------------------------------------------- Patient reports no complaints.    .  .   . Denies leaking of fluid.  ----------------------------------------------------------------------------------- The following portions of the patient's history were reviewed and updated as appropriate: allergies, current medications, past family history, past medical history, past social history, past surgical history and problem list. Problem list updated.   Objective  Blood pressure 110/68, weight 210 lb (95.3 kg), last menstrual period 10/08/2019, unknown if currently breastfeeding. Pregravid weight 190 lb (86.2 kg) Total Weight Gain 20 lb (9.072 kg) Urinalysis:      Fetal Status:           General:  Alert, oriented and cooperative. Patient is in no acute distress.  Skin: Skin is warm and dry. No rash noted.   Cardiovascular: Normal heart rate noted  Respiratory: Normal respiratory effort, no problems with respiration noted  Abdomen: Soft, gravid, appropriate for gestational age.       Pelvic:  Cervical exam deferred        Extremities: Normal range of motion.     ental Status: Normal  mood and affect. Normal behavior. Normal judgment and thought content.   Baseline: 125 Variability: moderate Accelerations: present Decelerations: absent Tocometry: N/A The patient was monitored for 30 minutes, fetal heart rate tracing was deemed reactive, category I tracing,  CPT [id number]   Assessment   30 y.o. G3P2002 at [redacted]w[redacted]d by  07/05/2020, by Ultrasound presenting for routine prenatal visit  Plan   Pregnancy 2021 Problems (from 04/19/20 to present)    Problem Noted Resolved   Gestational diabetes mellitus, currently pregnant 05/23/2020 by Conard Novak, MD No   Abnormal glucose affecting pregnancy 04/20/2020 by Burt Knack, RN No   Overview Addendum 05/10/2020  9:25 AM by Alberteen Spindle, CNM    Abnormal 1 hour gtt = 172, 04/19/2020. [x  ] Needs 3 hour gtt. Scheduled for 04/24/2020--DNKA Rescheduled for 04/25/20 and vomited after drinking glucola. Rescheduled for 05/02/20 and vomited after drinking glucola Pt self transferred care to [facility], [place] with first apt there 05/08/20      Previous Version   Supervision of high risk pregnancy in third trimester 04/19/2020 by Alberteen Spindle, CNM No   Overview Addendum 05/16/2020 10:23 AM by Natale Milch, MD     Nursing Staff Provider  Office Location [facility] Dating  12/30/19=13 1/7  Language  English Anatomy US  04/14/20- mild pelviectasis  Flu Vaccine   declined 04/19/20 Genetic Screen  None  TDaP vaccine    declined 04/19/20 Hgb A1C or  GTT Third trimester : elevated 1 hr (172) unable to complete 3hr, presumptive GDM  COVID vaccine  declined 04/19/20    Rhogam   not needed   LAB RESULTS   Feeding Plan  Breast Blood Type  O+,  04/19/2020  Contraception  desires BTL Antibody Negative,     04/19/2020  Circumcision  Rubella 1.28 (12/22 2132)  Pediatrician   unsure 04/19/20 RPR Non-reactive,     04/19/2020  Support Person  HBsAg Negative,     04/19/2020  Prenatal Classes  HIV Non-reactive,    04/19/2020  @28wk - Doula  referral?  Varicella  immune           (04/19/2020)    HCV   negative         (04/19/2020)  BTL Consent  GBS  (For PCN allergy, check sensitivities)        VBAC Consent  Pap  2022 NIL    Hgb Electro    BP Cuff ordered  CF   Delivery Group  [facility]   Centering Group  OBCM involved     High risk Pregnancy Diagnoses Late prenatal care Mild fetal pelviectasis Gestational Diabetes History of fetal macrosomia- 9lbs 3oz   NSTs:   Delivery:          Previous Version       Gestational age appropriate obstetric precautions including but not limited to vaginal bleeding, contractions, leaking of fluid and fetal movement were reviewed in detail with the patient.    1) GDM - no log reports most BG values at goal.  Reactive NST today.  Growth scan, NST, GBS next visit.  Would recommend 39 week IOL given non-compliance  Return in about 1 week (around 06/08/2020) for ROB, NST, growth scan.  06/10/2020, MD, [facility], [facility] 06/01/2020, 3:31 PM

## 2020-06-01 NOTE — Addendum Note (Signed)
Addended by: Lorrene Reid on: 06/01/2020 03:45 PM   Modules accepted: Orders

## 2020-06-08 ENCOUNTER — Ambulatory Visit (INDEPENDENT_AMBULATORY_CARE_PROVIDER_SITE_OTHER): Payer: Medicaid Other | Admitting: Obstetrics and Gynecology

## 2020-06-08 ENCOUNTER — Encounter: Payer: Self-pay | Admitting: Obstetrics and Gynecology

## 2020-06-08 ENCOUNTER — Other Ambulatory Visit: Payer: Self-pay

## 2020-06-08 ENCOUNTER — Other Ambulatory Visit (HOSPITAL_COMMUNITY)
Admission: RE | Admit: 2020-06-08 | Discharge: 2020-06-08 | Disposition: A | Discharge status: Home / Self Care | Payer: Medicaid Other | Source: Ambulatory Visit | Attending: Obstetrics and Gynecology | Admitting: Obstetrics and Gynecology

## 2020-06-08 VITALS — BP 118/72 | Wt 212.0 lb

## 2020-06-08 DIAGNOSIS — Z3A36 36 weeks gestation of pregnancy: Secondary | ICD-10-CM | POA: Diagnosis not present

## 2020-06-08 DIAGNOSIS — O2343 Unspecified infection of urinary tract in pregnancy, third trimester: Secondary | ICD-10-CM | POA: Diagnosis not present

## 2020-06-08 DIAGNOSIS — O26893 Other specified pregnancy related conditions, third trimester: Secondary | ICD-10-CM | POA: Diagnosis not present

## 2020-06-08 DIAGNOSIS — M545 Low back pain, unspecified: Secondary | ICD-10-CM

## 2020-06-08 DIAGNOSIS — O0993 Supervision of high risk pregnancy, unspecified, third trimester: Secondary | ICD-10-CM

## 2020-06-08 DIAGNOSIS — O24410 Gestational diabetes mellitus in pregnancy, diet controlled: Secondary | ICD-10-CM

## 2020-06-08 DIAGNOSIS — Z3685 Encounter for antenatal screening for Streptococcus B: Secondary | ICD-10-CM | POA: Diagnosis not present

## 2020-06-08 DIAGNOSIS — Z113 Encounter for screening for infections with a predominantly sexual mode of transmission: Secondary | ICD-10-CM | POA: Insufficient documentation

## 2020-06-08 DIAGNOSIS — R102 Pelvic and perineal pain: Secondary | ICD-10-CM | POA: Insufficient documentation

## 2020-06-08 LAB — FETAL NONSTRESS TEST

## 2020-06-08 LAB — POCT URINALYSIS DIPSTICK
Bilirubin, UA: NEGATIVE
Glucose, UA: NEGATIVE
Ketones, UA: NEGATIVE
Protein, UA: POSITIVE — AB
Spec Grav, UA: 1.015 (ref 1.010–1.025)
Urobilinogen, UA: 1 E.U./dL
pH, UA: 7.5 (ref 5.0–8.0)

## 2020-06-08 LAB — OB RESULTS CONSOLE GBS: GBS: NEGATIVE

## 2020-06-08 LAB — OB RESULTS CONSOLE GC/CHLAMYDIA
Chlamydia: NEGATIVE
Gonorrhea: NEGATIVE

## 2020-06-08 MED ORDER — CEPHALEXIN 500 MG PO CAPS: 500.0000 mg | ORAL_CAPSULE | Freq: Two times a day (BID) | ORAL | 0 refills | Status: DC

## 2020-06-08 NOTE — Progress Notes (Signed)
Routine Prenatal Care Visit  Subjective  Kylie [name] is a 30 y.o. G3P2002 at [redacted]w[redacted]d being seen today for ongoing prenatal care.  She is currently monitored for the following issues for this high-risk pregnancy and has Anxiety associated with depression dx'd age 65; Urinary tract infection without hematuria 03/14/21 in ER; COVID-19 affecting pregnancy in third trimester 04/14/20; Late prenatal care 29 wks; Overweight BMI=29; Supervision of high risk pregnancy in third trimester; Irritable bowel syndrome dx'd age 58; history macrosomic infant 9 lb 3 oz 12/21/09; fetal prominent right rental pelvis on 04/14/20 u/s; Marijuana use with +UDS 02/28/20 ER; +UDS MJ 04/19/20; Abnormal glucose affecting pregnancy; Gestational diabetes mellitus, currently pregnant; and Pelvic pain in female on their problem list.  ----------------------------------------------------------------------------------- Patient reports concern regarding vaginal bleeding and mid-lower back pain. Patient states she first noted bright red bleeding last night which then lightened and she only noticed with wiping. Patient states she has not had to wear or filled a peri pad. Additionally, patient states she started noting intermittent back pain this morning. She states it "comes and goes" and feels similar to when her first child was "sunny-side up." She currently rates the pain as 4/10.  Contractions: Irregular. Vag. Bleeding: None.  Movement: Present. Denies leaking of fluid.  ----------------------------------------------------------------------------------- The following portions of the patient's history were reviewed and updated as appropriate: allergies, current medications, past family history, past medical history, past social history, past surgical history and problem list. Problem list updated.  Objective  Blood pressure 118/72, weight 212 lb (96.2 kg), last menstrual period 10/08/2019, unknown if currently breastfeeding. Pregravid  weight 190 lb (86.2 kg) Total Weight Gain 22 lb (9.979 kg) Urinalysis:      Fetal Status: Fetal Heart Rate (bpm): 140 (RNST) Fundal Height: 36 cm Movement: Present  Presentation: Vertex  NONSTRESS TEST INTERPRETATION  INDICATIONS: gestational diabetes mellitus FHR baseline: 140 bmp RESULTS:  A NST procedure was performed with FHR monitoring and a normal baseline established, appropriate time of 20-40 minutes of evaluation, and accels >2 seen w 15x15 characteristics.  Results show a REACTIVE NST.   General:  Alert, oriented and cooperative. Patient is in no acute distress.  Skin: Skin is warm and dry. No rash noted.   Cardiovascular: Normal heart rate noted  Respiratory: Normal respiratory effort, no problems with respiration noted  Abdomen: Soft, gravid, appropriate for gestational age. Pain/Pressure: Present     Pelvic:  Cervical exam performed Dilation: Closed Effacement (%): Thick Station: -3 - small amount of dark brown discharge noted on glove with SVE  Extremities: Normal range of motion.  Edema: None  ental Status: Normal mood and affect. Normal behavior. Normal judgment and thought content.     Assessment   30 y.o. G3P2002 at [redacted]w[redacted]d by  07/05/2020, by Ultrasound presenting for routine prenatal visit  Plan   Pregnancy 2021 Problems (from 04/19/20 to present)    Problem Noted Resolved   Gestational diabetes mellitus, currently pregnant 05/23/2020 by Conard Novak, MD No   Abnormal glucose affecting pregnancy 04/20/2020 by Burt Knack, RN No   Overview Addendum 05/10/2020  9:25 AM by Alberteen Spindle, CNM    Abnormal 1 hour gtt = 172, 04/19/2020. [x  ] Needs 3 hour gtt. Scheduled for 04/24/2020--DNKA Rescheduled for 04/25/20 and vomited after drinking glucola. Rescheduled for 05/02/20 and vomited after drinking glucola Pt self transferred care to [facility] with first apt there 05/08/20      Previous Version   Supervision of high risk pregnancy  in third trimester 04/19/2020  by Alberteen Spindle, CNM No   Overview Addendum 05/16/2020 10:23 AM by Natale Milch, MD     Nursing Staff Provider  Office Location [facility] Dating  12/30/19=13 1/7  Language  English Anatomy US  04/14/20- mild pelviectasis  Flu Vaccine   declined 04/19/20 Genetic Screen  None  TDaP vaccine    declined 04/19/20 Hgb A1C or  GTT Third trimester : elevated 1 hr (172) unable to complete 3hr, presumptive GDM  COVID vaccine  declined 04/19/20    Rhogam   not needed   LAB RESULTS   Feeding Plan  Breast Blood Type  O+,      04/19/2020  Contraception  desires BTL Antibody Negative,     04/19/2020  Circumcision  Rubella 1.28 (12/22 2132)  Pediatrician   unsure 04/19/20 RPR Non-reactive,     04/19/2020  Support Person  HBsAg Negative,     04/19/2020  Prenatal Classes  HIV Non-reactive,    04/19/2020  @28wk - Doula referral?  Varicella  immune           (04/19/2020)    HCV   negative         (04/19/2020)  BTL Consent  GBS  (For PCN allergy, check sensitivities)        VBAC Consent  Pap  2022 NIL    Hgb Electro    BP Cuff ordered  CF   Delivery Group  [facility]   Centering Group  OBCM involved     High risk Pregnancy Diagnoses Late prenatal care Mild fetal pelviectasis Gestational Diabetes History of fetal macrosomia- 9lbs 3oz   NSTs:   Delivery:          Previous Version      -Vaginal spotting - small amount of brown discharge noted on glove during exam. SVE- closed/thick/-3. Aptima obtained. Reviewed red flags and when to RTC. -Back pain - UA +LE. Antibx rx'd. Culture sent. -Patient did not bring glucose log today. Patient reports checking her BG 4x daily. She states that her fasting glucose levels is typically 80-90s and her postprandial values are 110-115. She states she checks her BG one hour after meals. Patient encouraged to take a photo of her BG log incase she forgets to bring it to her next visit. -Reviewed reactive NST with patient. -Reviewed Dr. 2023 recommendation  for 39 week IOL - patient stated understanding.  Preterm labor precautions including but not limited to vaginal bleeding, contractions, leaking of fluid and fetal movement were reviewed in detail with the patient.    Return for previously scheduled follow-up.  Ramiro Harvest, CNM, MSN [facility], [facility] 06/08/2020, 11:31 AM

## 2020-06-08 NOTE — Addendum Note (Signed)
Addended by: Fortunato Curling R on: 06/08/2020 11:45 AM   Modules accepted: Orders

## 2020-06-09 ENCOUNTER — Other Ambulatory Visit: Payer: Self-pay | Admitting: Obstetrics and Gynecology

## 2020-06-09 DIAGNOSIS — N760 Acute vaginitis: Secondary | ICD-10-CM

## 2020-06-09 DIAGNOSIS — B373 Candidiasis of vulva and vagina: Secondary | ICD-10-CM

## 2020-06-09 DIAGNOSIS — B9689 Other specified bacterial agents as the cause of diseases classified elsewhere: Secondary | ICD-10-CM

## 2020-06-09 DIAGNOSIS — B3731 Acute candidiasis of vulva and vagina: Secondary | ICD-10-CM

## 2020-06-09 LAB — CERVICOVAGINAL ANCILLARY ONLY
Bacterial Vaginitis (gardnerella): POSITIVE — AB
Candida Glabrata: NEGATIVE
Candida Vaginitis: POSITIVE — AB
Chlamydia: NEGATIVE
Comment: NEGATIVE
Comment: NEGATIVE
Comment: NEGATIVE
Comment: NEGATIVE
Comment: NEGATIVE
Comment: NORMAL
Neisseria Gonorrhea: NEGATIVE
Trichomonas: NEGATIVE

## 2020-06-09 MED ORDER — METRONIDAZOLE 500 MG PO TABS: 500.0000 mg | ORAL_TABLET | Freq: Two times a day (BID) | ORAL | 0 refills | Status: AC

## 2020-06-09 MED ORDER — TERCONAZOLE 0.4 % VA CREA: 1.0000 | TOPICAL_CREAM | Freq: Every day | VAGINAL | 0 refills | Status: DC

## 2020-06-09 NOTE — Progress Notes (Signed)
Spoke with patient via phone to review results from aptima swab. Patient positive for BV and yeast. Medications rx'd, reviewed with patient. All questions answered.

## 2020-06-10 LAB — STREP GP B NAA: Strep Gp B NAA: NEGATIVE

## 2020-06-12 LAB — URINE CULTURE

## 2020-06-15 ENCOUNTER — Ambulatory Visit (INDEPENDENT_AMBULATORY_CARE_PROVIDER_SITE_OTHER): Payer: Medicaid Other | Admitting: Advanced Practice Midwife

## 2020-06-15 ENCOUNTER — Other Ambulatory Visit: Payer: Self-pay

## 2020-06-15 ENCOUNTER — Encounter: Payer: Self-pay | Admitting: Advanced Practice Midwife

## 2020-06-15 VITALS — BP 110/60 | Wt 215.0 lb

## 2020-06-15 DIAGNOSIS — O0993 Supervision of high risk pregnancy, unspecified, third trimester: Secondary | ICD-10-CM | POA: Diagnosis not present

## 2020-06-15 DIAGNOSIS — O24410 Gestational diabetes mellitus in pregnancy, diet controlled: Secondary | ICD-10-CM | POA: Diagnosis not present

## 2020-06-15 DIAGNOSIS — Z3A37 37 weeks gestation of pregnancy: Secondary | ICD-10-CM | POA: Diagnosis not present

## 2020-06-15 LAB — FETAL NONSTRESS TEST

## 2020-06-15 NOTE — Progress Notes (Signed)
Routine Prenatal Care Visit  Subjective  Kylie [name] is a 30 y.o. G3P2002 at [redacted]w[redacted]d being seen today for ongoing prenatal care.  She is currently monitored for the following issues for this high-risk pregnancy and has Anxiety associated with depression dx'd age 37; Urinary tract infection without hematuria 03/14/21 in ER; COVID-19 affecting pregnancy in third trimester 04/14/20; Late prenatal care 29 wks; Overweight BMI=29; Supervision of high risk pregnancy in third trimester; Irritable bowel syndrome dx'd age 55; history macrosomic infant 9 lb 3 oz 12/21/09; fetal prominent right rental pelvis on 04/14/20 u/s; Marijuana use with +UDS 02/28/20 ER; +UDS MJ 04/19/20; Abnormal glucose affecting pregnancy; Gestational diabetes mellitus, currently pregnant; and Pelvic pain in female on their problem list.  ----------------------------------------------------------------------------------- Patient reports feeling tired.  She did not bring BS log and reports all values wnl with rare elevation after meals (once every 2 weeks). Contractions: Irregular. Vag. Bleeding: None.  Movement: Present. Leaking Fluid denies.  ----------------------------------------------------------------------------------- The following portions of the patient's history were reviewed and updated as appropriate: allergies, current medications, past family history, past medical history, past social history, past surgical history and problem list. Problem list updated.  Objective  Blood pressure 110/60, weight 215 lb (97.5 kg), last menstrual period 10/08/2019 Pregravid weight 190 lb (86.2 kg) Total Weight Gain 25 lb (11.3 kg) Urinalysis: Urine Protein    Urine Glucose    Fetal Status: Fetal Heart Rate (bpm): 140   Movement: Present      NST: reactive 20 minute tracing, 140 bpm, moderate variability, +accelerations, -decelerations  General:  Alert, oriented and cooperative. Patient is in no acute distress.  Skin: Skin is warm and  dry. No rash noted.   Cardiovascular: Normal heart rate noted  Respiratory: Normal respiratory effort, no problems with respiration noted  Abdomen: Soft, gravid, appropriate for gestational age. Pain/Pressure: Absent     Pelvic:  Cervical exam deferred        Extremities: Normal range of motion.  Edema: None  Mental Status: Normal mood and affect. Normal behavior. Normal judgment and thought content.   Assessment   30 y.o. G3P2002 at [redacted]w[redacted]d by  07/05/2020, by Ultrasound presenting for routine prenatal visit  Plan   Pregnancy 2021 Problems (from 04/19/20 to present)    Problem Noted Resolved   Gestational diabetes mellitus, currently pregnant 05/23/2020 by Conard Novak, MD No   Abnormal glucose affecting pregnancy 04/20/2020 by Burt Knack, RN No   Overview Addendum 05/10/2020  9:25 AM by Alberteen Spindle, CNM    Abnormal 1 hour gtt = 172, 04/19/2020. [x  ] Needs 3 hour gtt. Scheduled for 04/24/2020--DNKA Rescheduled for 04/25/20 and vomited after drinking glucola. Rescheduled for 05/02/20 and vomited after drinking glucola Pt self transferred care to [facility] with first apt there 05/08/20      Previous Version   Supervision of high risk pregnancy in third trimester 04/19/2020 by Alberteen Spindle, CNM No   Overview Addendum 05/16/2020 10:23 AM by Natale Milch, MD     Nursing Staff Provider  Office Location [facility] Dating  12/30/19=13 1/7  Language  English Anatomy US  04/14/20- mild pelviectasis  Flu Vaccine   declined 04/19/20 Genetic Screen  None  TDaP vaccine    declined 04/19/20 Hgb A1C or  GTT Third trimester : elevated 1 hr (172) unable to complete 3hr, presumptive GDM  COVID vaccine  declined 04/19/20    Rhogam   not needed   LAB RESULTS   Feeding Plan  Breast Blood  Type  O+,      04/19/2020  Contraception  desires BTL Antibody Negative,     04/19/2020  Circumcision  Rubella 1.28 (12/22 2132)  Pediatrician   unsure 04/19/20 RPR Non-reactive,     04/19/2020  Support  Person  HBsAg Negative,     04/19/2020  Prenatal Classes  HIV Non-reactive,    04/19/2020  @28wk - Doula referral?  Varicella  immune           (04/19/2020)    HCV   negative         (04/19/2020)  BTL Consent  GBS  (For PCN allergy, check sensitivities)        VBAC Consent  Pap  2022 NIL    Hgb Electro    BP Cuff ordered  CF   Delivery Group  [facility]   Centering Group  OBCM involved     High risk Pregnancy Diagnoses Late prenatal care Mild fetal pelviectasis Gestational Diabetes History of fetal macrosomia- 9lbs 3oz   NSTs:   Delivery:          Previous Version       Term labor symptoms and general obstetric precautions including but not limited to vaginal bleeding, contractions, leaking of fluid and fetal movement were reviewed in detail with the patient. Please refer to After Visit Summary for other counseling recommendations.   IOL scheduled 06/28/20 8AM Covid swab 06/26/20 [facility]  Return in about 1 week (around 06/22/2020) for nst and rob/ growth scan in [place] 3/28 at 9:30.  4/28, CNM 06/15/2020 9:55 AM

## 2020-06-15 NOTE — H&P (Signed)
OB History & Physical   History of Present Illness:  Chief Complaint: induction of labor for gestational diabetes- non-compliance with care   From [place] on 06/01/2020 1) GDM - no log reports most BG values at goal.  Reactive NST today.  Growth scan, NST, GBS next visit.  Would recommend 39 week IOL given non-compliance  Return in about 1 week (around 06/08/2020) for ROB, NST, growth scan. Malachy Mood, MD, FACOG   HPI:  Kylie [name] is a 30 y.o. G47P2002 female at 50w1ddated by 13 wk ultrasound.  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    She denies contractions.   She denies leakage of fluid.   She denies vaginal bleeding.   She reports fetal movement.    Total weight gain for pregnancy: 25 lb (11.3 kg)   Obstetrical Problem List: Pregnancy 2021 Problems (from 04/19/20 to present)    Problem Noted Resolved   Gestational diabetes mellitus, currently pregnant 05/23/2020 by JWill Bonnet MD No   Abnormal glucose affecting pregnancy 04/20/2020 by BJenetta Downer RN No   Overview Addendum 05/10/2020  9:25 AM by SHerbie Saxon CNM    Abnormal 1 hour gtt = 172, 04/19/2020. [x  ] Needs 3 hour gtt. Scheduled for 04/24/2020--DNKA Rescheduled for 04/25/20 and vomited after drinking glucola. Rescheduled for 05/02/20 and vomited after drinking glucola [facility]      Previous Version   Supervision of high risk pregnancy in  third trimester 04/19/2020 by SHerbie Saxon CNM No   Overview Addendum 05/16/2020 10:23 AM by SHomero Fellers MD     Nursing Staff Provider  [facility] Dating  12/30/19=13 1/7  Language  English Anatomy [place] 04/14/20- mild pelviectasis  Flu Vaccine   declined 04/19/20 Genetic Screen  None  TDaP vaccine    declined 04/19/20 Hgb A1C or  GTT Third trimester : elevated 1 hr (172) unable to complete 3hr, presumptive GDM  COVID vaccine  declined 04/19/20    Rhogam   not needed   LAB RESULTS   Feeding Plan  Breast Blood Type  O+,      04/19/2020  Contraception  desires BTL Antibody Negative,     04/19/2020  Circumcision  Rubella 1.28 (12/22 2132)  Pediatrician   unsure 04/19/20 RPR Non-reactive,     04/19/2020  Support Person  HBsAg Negative,     04/19/2020  Prenatal Classes  HIV Non-reactive,    04/19/2020  _0 - Doula referral?  Varicella  immune           (04/19/2020)    HCV   negative         (04/19/2020)  BTL Consent  GBS  (For PCN allergy, check sensitivities)        VBAC Consent  Pap  2022 NIL    Hgb Electro    BP Cuff ordered  CF   Delivery Group  [facility]   [facility]  OBCM involved     High risk Pregnancy Diagnoses  Late prenatal care Mild fetal pelviectasis Gestational Diabetes History of fetal macrosomia- 9lbs 3oz   NSTs:   Delivery:          Previous Version       Maternal Medical History:   Past Medical History:  Diagnosis Date  . Anemia   . Family history of ovarian cancer    3/22 cancer genetic testing letter sent  . History of gonorrhea    treated 07/2019 at [facility]  Marland Kitchen History of urinary infection   . Irritable bowel syndrome   . Migraine     Past Surgical History:  Procedure Laterality Date  . COLONOSCOPY    . denies surgical history    . NO PAST SURGERIES      No Known Allergies  Prior to Admission medications   Medication Sig Start Date End Date Taking? Authorizing Provider  Blood Glucose Monitoring  Suppl (ACCU-CHEK GUIDE ME) w/Device KIT 1 each by Does not apply route in the morning, at noon, in the evening, and at bedtime. 05/16/20   Schuman, Stefanie Libel, MD  cephALEXin (KEFLEX) 500 MG capsule Take 1 capsule (500 mg total) by mouth 2 (two) times daily. 06/08/20   Orlie Pollen, CNM  glucose blood (ACCU-CHEK GUIDE) test strip Check blood glucose four times a day, fasting and 1 hour after meals 05/16/20   Schuman, Christanna R, MD  glucose blood (ACCU-CHEK SMARTVIEW) test strip Use as instructed to check blood sugars Patient not taking: Reported on 05/16/2020 05/09/20   Caren Macadam, MD  metroNIDAZOLE (FLAGYL) 500 MG tablet Take 1 tablet (500 mg total) by mouth 2 (two) times daily for 7 days. 06/09/20 06/16/20  Orlie Pollen, CNM  ondansetron (ZOFRAN ODT) 4 MG disintegrating tablet Take 1 tablet (4 mg total) by mouth every 8 (eight) hours as needed. 04/14/20   Malachy Mood, MD  Prenatal Vit-Fe Fumarate-FA (PRENATAL VITAMINS PO) Take 1 tablet by mouth daily.    [provider]  promethazine (PHENERGAN) 25 MG tablet Take 1 tablet (25 mg total) by mouth daily for 2 doses. Take 1 tablet the night before glucose tolerance test, and take 2nd dose 30 min before glucose tolerance test. 05/04/20 05/06/20  Streilein, Ralene Bathe, PA-C  terconazole (TERAZOL 7) 0.4 % vaginal cream Place 1 applicator vaginally at bedtime. 06/09/20   Orlie Pollen, CNM    OB History  Gravida Para Term Preterm AB Living  _0 0 0 2  SAB IAB Ectopic Multiple Live Births  0 0 0 0 2    # Outcome Date GA Lbr Len/2nd Weight Sex Delivery Anes PTL Lv  3 Current           2 Term 03/25/14 [redacted]w[redacted]d 8 lb 6 oz (3.799 kg) F Vag-Spont EPI N LIV     Birth Comments: [facility], IOL     Complications: Umbilical cord around fetal neck with cord compression  1 Term 12/21/09 435w4d9 lb 3 oz (4.167 kg) M Vag-Spont EPI N LIV     Birth Comments: [facility], nuchal cord     Complications: Umbilical cord around fetal neck with cord  compression, Meconium aspiration    Prenatal care site: [facility]  Social History: She  reports that she has quit smoking. Her smoking use included cigarettes. She quit after 12.00 years of use. She has never used smokeless tobacco. She reports current alcohol use. She reports current drug use. Frequency: 14.00 times per week. Drug: Marijuana.  Family History: family history  includes Alzheimer's disease in her maternal grandfather; Asperger's syndrome in her brother; Bipolar disorder in her father and half-sister; Brain cancer in her paternal aunt; Depression in her father and half-sister; Diabetes in her father and mother; Heart disease in her paternal uncle; Hypertension in her father and mother; Lung cancer in her paternal aunt and paternal grandmother; Ovarian cancer in her maternal aunt.    Review of Systems:  Review of Systems  Constitutional: Positive for malaise/fatigue. Negative for chills and fever.  HENT: Negative for congestion, ear discharge, ear pain, hearing loss, sinus pain and sore throat.   Eyes: Negative for blurred vision and double vision.  Respiratory: Negative for cough, shortness of breath and wheezing.   Cardiovascular: Negative for chest pain, palpitations and leg swelling.  Gastrointestinal: Negative for abdominal pain, blood in stool, constipation, diarrhea, heartburn, melena, nausea and vomiting.  Genitourinary: Negative for dysuria, flank pain, frequency, hematuria and urgency.  Musculoskeletal: Negative for back pain, joint pain and myalgias.  Skin: Negative for itching and rash.  Neurological: Negative for dizziness, tingling, tremors, sensory change, speech change, focal weakness, seizures, loss of consciousness, weakness and headaches.  Endo/Heme/Allergies: Negative for environmental allergies. Does not bruise/bleed easily.  Psychiatric/Behavioral: Negative for depression, hallucinations, memory loss, substance abuse and suicidal ideas. The patient is  not nervous/anxious and does not have insomnia.      Physical Exam:  BP 110/60   Wt 215 lb (97.5 kg)   LMP 10/08/2019 (Within Days)   BMI 30.85 kg/m   Constitutional: Well nourished, well developed female in no acute distress.  HEENT: normal Skin: Warm and dry.  Cardiovascular: Regular rate and rhythm.   Extremity: no edema  Respiratory: Clear to auscultation bilateral. Normal respiratory effort Abdomen: FHT present Back: no CVAT Neuro: DTRs 2+, Cranial nerves grossly intact Psych: Alert and Oriented x3. No memory deficits. Normal mood and affect.  MS: normal gait, normal bilateral lower extremity ROM/strength/stability.  BS log not brought to visit: patient reports all normal values  Pertinent Results:   Baseline FHR: 140 beats/min   Variability: moderate   Accelerations: present   Decelerations: absent Contractions: not assessed on office NST Overall assessment: reassuring  Current Covid test pending  Lab Results  Component Value Date   SARSCOV2NAA POSITIVE (A) 04/14/2020    Assessment:  Kylie [name] is a 30 y.o. G38P2002 female at 70w1dwith IOL for non-compliant with care GDM A1.   Plan:  1. Admit to Labor & Delivery  2. CBC, T&S, Clrs, IVF 3. GBS negative.   4. Fetal well-being: Category I 5. Begin induction based on admission exam 6. Diet controlled GDM: POCT glucose Q4h   JRod Can CNM 06/15/2020 10:09 AM

## 2020-06-19 ENCOUNTER — Ambulatory Visit: Payer: Medicaid Other | Admitting: *Deleted

## 2020-06-19 ENCOUNTER — Encounter: Payer: Self-pay | Admitting: *Deleted

## 2020-06-19 ENCOUNTER — Other Ambulatory Visit: Payer: Self-pay

## 2020-06-19 ENCOUNTER — Ambulatory Visit: Payer: Medicaid Other | Attending: Obstetrics and Gynecology

## 2020-06-19 ENCOUNTER — Other Ambulatory Visit: Payer: Self-pay | Admitting: Advanced Practice Midwife

## 2020-06-19 DIAGNOSIS — O0993 Supervision of high risk pregnancy, unspecified, third trimester: Secondary | ICD-10-CM | POA: Insufficient documentation

## 2020-06-19 DIAGNOSIS — O09293 Supervision of pregnancy with other poor reproductive or obstetric history, third trimester: Secondary | ICD-10-CM | POA: Diagnosis not present

## 2020-06-19 DIAGNOSIS — O24410 Gestational diabetes mellitus in pregnancy, diet controlled: Secondary | ICD-10-CM | POA: Diagnosis present

## 2020-06-19 DIAGNOSIS — O0933 Supervision of pregnancy with insufficient antenatal care, third trimester: Secondary | ICD-10-CM

## 2020-06-19 DIAGNOSIS — O3663X0 Maternal care for excessive fetal growth, third trimester, not applicable or unspecified: Secondary | ICD-10-CM | POA: Insufficient documentation

## 2020-06-19 DIAGNOSIS — O24419 Gestational diabetes mellitus in pregnancy, unspecified control: Secondary | ICD-10-CM

## 2020-06-19 DIAGNOSIS — Z363 Encounter for antenatal screening for malformations: Secondary | ICD-10-CM | POA: Diagnosis not present

## 2020-06-19 DIAGNOSIS — Z3A37 37 weeks gestation of pregnancy: Secondary | ICD-10-CM | POA: Diagnosis not present

## 2020-06-19 DIAGNOSIS — O99810 Abnormal glucose complicating pregnancy: Secondary | ICD-10-CM

## 2020-06-20 ENCOUNTER — Ambulatory Visit: Payer: Medicaid Other

## 2020-06-21 ENCOUNTER — Encounter: Payer: Medicaid Other | Admitting: Obstetrics

## 2020-06-22 ENCOUNTER — Other Ambulatory Visit: Payer: Self-pay

## 2020-06-22 ENCOUNTER — Ambulatory Visit (INDEPENDENT_AMBULATORY_CARE_PROVIDER_SITE_OTHER): Payer: Medicaid Other | Admitting: Advanced Practice Midwife

## 2020-06-22 ENCOUNTER — Encounter: Payer: Self-pay | Admitting: Advanced Practice Midwife

## 2020-06-22 VITALS — BP 122/74 | Wt 213.0 lb

## 2020-06-22 DIAGNOSIS — O24410 Gestational diabetes mellitus in pregnancy, diet controlled: Secondary | ICD-10-CM

## 2020-06-22 DIAGNOSIS — Z3A38 38 weeks gestation of pregnancy: Secondary | ICD-10-CM

## 2020-06-22 DIAGNOSIS — O0993 Supervision of high risk pregnancy, unspecified, third trimester: Secondary | ICD-10-CM

## 2020-06-22 NOTE — Progress Notes (Signed)
No vb. No lof.  

## 2020-06-22 NOTE — Progress Notes (Signed)
Routine Prenatal Care Visit  Subjective  Kylie [name] is a 30 y.o. G3P2002 at [redacted]w[redacted]d being seen today for ongoing prenatal care.  She is currently monitored for the following issues for this high-risk pregnancy and has Anxiety associated with depression dx'd age 83; Urinary tract infection without hematuria 03/14/21 in ER; COVID-19 affecting pregnancy in third trimester 04/14/20; Late prenatal care 29 wks; Overweight BMI=29; Supervision of high risk pregnancy in third trimester; Irritable bowel syndrome dx'd age 31; history macrosomic infant 9 lb 3 oz 12/21/09; fetal prominent right rental pelvis on 04/14/20 u/s; Marijuana use with +UDS 02/28/20 ER; +UDS MJ 04/19/20; Abnormal glucose affecting pregnancy; Gestational diabetes mellitus, currently pregnant; and Pelvic pain in female on their problem list.  ----------------------------------------------------------------------------------- Patient reports fatigue.  She reports blood sugars are all in normal range. Contractions: Not present. Vag. Bleeding: None.  Movement: Present. Leaking Fluid denies.  ----------------------------------------------------------------------------------- The following portions of the patient's history were reviewed and updated as appropriate: allergies, current medications, past family history, past medical history, past social history, past surgical history and problem list. Problem list updated.  Objective  Blood pressure 122/74, weight 213 lb (96.6 kg), last menstrual period 10/08/2019 Pregravid weight 190 lb (86.2 kg) Total Weight Gain 23 lb (10.4 kg) Urinalysis: Urine Protein    Urine Glucose    Fetal Status:     Movement: Present     General:  Alert, oriented and cooperative. Patient is in no acute distress.  Skin: Skin is warm and dry. No rash noted.   Cardiovascular: Normal heart rate noted  Respiratory: Normal respiratory effort, no problems with respiration noted  Abdomen: Soft, gravid, appropriate for  gestational age. Pain/Pressure: Absent     Pelvic:  only able to reach edge of posterior cervix- external os is open and possibly internal os is open, however, unable to reach, still has thickness 50%        Extremities: Normal range of motion.     Mental Status: Normal mood and affect. Normal behavior. Normal judgment and thought content.   Assessment   30 y.o. G3P2002 at [redacted]w[redacted]d by  07/05/2020, by Ultrasound presenting for routine prenatal visit  Plan   Pregnancy 2021 Problems (from 04/19/20 to present)    Problem Noted Resolved   Gestational diabetes mellitus, currently pregnant 05/23/2020 by Conard Novak, MD No   Abnormal glucose affecting pregnancy 04/20/2020 by Burt Knack, RN No   Overview Addendum 05/10/2020  9:25 AM by Alberteen Spindle, CNM    Abnormal 1 hour gtt = 172, 04/19/2020. [x  ] Needs 3 hour gtt. Scheduled for 04/24/2020--DNKA Rescheduled for 04/25/20 and vomited after drinking glucola. Rescheduled for 05/02/20 and vomited after drinking glucola Pt self transferred care to [facility] with first apt there 05/08/20      Previous Version   Supervision of high risk pregnancy in third trimester 04/19/2020 by Alberteen Spindle, CNM No   Overview Addendum 05/16/2020 10:23 AM by Natale Milch, MD     Nursing Staff Provider  [facility] Dating  12/30/19=13 1/7  Language  English Anatomy US  04/14/20- mild pelviectasis  Flu Vaccine   declined 04/19/20 Genetic Screen  None  TDaP vaccine    declined 04/19/20 Hgb A1C or  GTT Third trimester : elevated 1 hr (172) unable to complete 3hr, presumptive GDM  COVID vaccine  declined 04/19/20    Rhogam   not needed   LAB RESULTS   Feeding Plan  Breast Blood Type  O+,  04/19/2020  Contraception  desires BTL Antibody Negative,     04/19/2020  Circumcision  Rubella 1.28 (12/22 2132)  Pediatrician   unsure 04/19/20 RPR Non-reactive,     04/19/2020  Support Person  HBsAg Negative,     04/19/2020  Prenatal Classes  HIV  Non-reactive,    04/19/2020  @28wk - Doula referral?  Varicella  immune           (04/19/2020)    HCV   negative         (04/19/2020)  BTL Consent  GBS  (For PCN allergy, check sensitivities)        VBAC Consent  Pap  2022 NIL    Hgb Electro    BP Cuff ordered  CF   Delivery Group  [facility]   Centering Group  OBCM involved     High risk Pregnancy Diagnoses Late prenatal care Mild fetal pelviectasis Gestational Diabetes History of fetal macrosomia- 9lbs 3oz   NSTs:   Delivery:          Previous Version       Term labor symptoms and general obstetric precautions including but not limited to vaginal bleeding, contractions, leaking of fluid and fetal movement were reviewed in detail with the patient.   Return for induction on 4/6.  6/6, CNM 06/22/2020 10:53 AM

## 2020-06-26 ENCOUNTER — Inpatient Hospital Stay: Admission: RE | Admit: 2020-06-26 | Payer: Medicaid Other | Source: Ambulatory Visit

## 2020-06-28 ENCOUNTER — Inpatient Hospital Stay
Admission: RE | Admit: 2020-06-28 | Discharge: 2020-07-01 | DRG: 797 | Disposition: A | Discharge status: Home / Self Care | Payer: Medicaid Other | Attending: Obstetrics & Gynecology | Admitting: Obstetrics & Gynecology

## 2020-06-28 ENCOUNTER — Encounter: Payer: Self-pay | Admitting: Advanced Practice Midwife

## 2020-06-28 ENCOUNTER — Other Ambulatory Visit: Payer: Self-pay

## 2020-06-28 DIAGNOSIS — Z302 Encounter for sterilization: Secondary | ICD-10-CM | POA: Diagnosis not present

## 2020-06-28 DIAGNOSIS — O99810 Abnormal glucose complicating pregnancy: Secondary | ICD-10-CM

## 2020-06-28 DIAGNOSIS — O24420 Gestational diabetes mellitus in childbirth, diet controlled: Principal | ICD-10-CM | POA: Diagnosis present

## 2020-06-28 DIAGNOSIS — Z3A37 37 weeks gestation of pregnancy: Secondary | ICD-10-CM | POA: Diagnosis not present

## 2020-06-28 DIAGNOSIS — O9081 Anemia of the puerperium: Secondary | ICD-10-CM | POA: Diagnosis not present

## 2020-06-28 DIAGNOSIS — R9389 Abnormal findings on diagnostic imaging of other specified body structures: Secondary | ICD-10-CM

## 2020-06-28 DIAGNOSIS — O0993 Supervision of high risk pregnancy, unspecified, third trimester: Secondary | ICD-10-CM

## 2020-06-28 DIAGNOSIS — Z3490 Encounter for supervision of normal pregnancy, unspecified, unspecified trimester: Secondary | ICD-10-CM

## 2020-06-28 DIAGNOSIS — O24410 Gestational diabetes mellitus in pregnancy, diet controlled: Secondary | ICD-10-CM

## 2020-06-28 DIAGNOSIS — Z87891 Personal history of nicotine dependence: Secondary | ICD-10-CM | POA: Diagnosis not present

## 2020-06-28 DIAGNOSIS — Z3009 Encounter for other general counseling and advice on contraception: Secondary | ICD-10-CM

## 2020-06-28 DIAGNOSIS — D62 Acute posthemorrhagic anemia: Secondary | ICD-10-CM | POA: Diagnosis not present

## 2020-06-28 DIAGNOSIS — Z3A39 39 weeks gestation of pregnancy: Secondary | ICD-10-CM

## 2020-06-28 DIAGNOSIS — O26893 Other specified pregnancy related conditions, third trimester: Secondary | ICD-10-CM | POA: Diagnosis not present

## 2020-06-28 DIAGNOSIS — O24419 Gestational diabetes mellitus in pregnancy, unspecified control: Secondary | ICD-10-CM | POA: Diagnosis present

## 2020-06-28 DIAGNOSIS — F418 Other specified anxiety disorders: Secondary | ICD-10-CM

## 2020-06-28 LAB — TYPE AND SCREEN
ABO/RH(D): O POS
Antibody Screen: NEGATIVE

## 2020-06-28 LAB — URINE DRUG SCREEN, QUALITATIVE (ARMC ONLY)
Amphetamines, Ur Screen: NOT DETECTED
Barbiturates, Ur Screen: NOT DETECTED
Benzodiazepine, Ur Scrn: NOT DETECTED
Cannabinoid 50 Ng, Ur ~~LOC~~: POSITIVE — AB
Cocaine Metabolite,Ur ~~LOC~~: NOT DETECTED
MDMA (Ecstasy)Ur Screen: NOT DETECTED
Methadone Scn, Ur: NOT DETECTED
Opiate, Ur Screen: NOT DETECTED
Phencyclidine (PCP) Ur S: NOT DETECTED
Tricyclic, Ur Screen: NOT DETECTED

## 2020-06-28 LAB — CBC
HCT: 31.4 % — ABNORMAL LOW (ref 36.0–46.0)
Hemoglobin: 10.7 g/dL — ABNORMAL LOW (ref 12.0–15.0)
MCH: 28.8 pg (ref 26.0–34.0)
MCHC: 34.1 g/dL (ref 30.0–36.0)
MCV: 84.4 fL (ref 80.0–100.0)
Platelets: 135 10*3/uL — ABNORMAL LOW (ref 150–400)
RBC: 3.72 MIL/uL — ABNORMAL LOW (ref 3.87–5.11)
RDW: 14.4 % (ref 11.5–15.5)
WBC: 6.2 10*3/uL (ref 4.0–10.5)
nRBC: 0 % (ref 0.0–0.2)

## 2020-06-28 LAB — GLUCOSE, CAPILLARY
Glucose-Capillary: 82 mg/dL (ref 70–99)
Glucose-Capillary: 80 mg/dL (ref 70–99)
Glucose-Capillary: 111 mg/dL — ABNORMAL HIGH (ref 70–99)
Glucose-Capillary: 77 mg/dL (ref 70–99)

## 2020-06-28 MED ORDER — OXYTOCIN-SODIUM CHLORIDE 30-0.9 UT/500ML-% IV SOLN
2.5000 [IU]/h | INTRAVENOUS | Status: DC
  Administered 2020-06-29: 2.5 [IU]/h via INTRAVENOUS
  Filled 2020-06-28: qty 500

## 2020-06-28 MED ORDER — OXYTOCIN BOLUS FROM INFUSION
333.0000 mL | Freq: Once | INTRAVENOUS | Status: AC
  Administered 2020-06-29: 333 mL via INTRAVENOUS

## 2020-06-28 MED ORDER — OXYTOCIN-SODIUM CHLORIDE 30-0.9 UT/500ML-% IV SOLN
1.0000 m[IU]/min | INTRAVENOUS | Status: DC
  Administered 2020-06-28: 2 m[IU]/min via INTRAVENOUS

## 2020-06-28 MED ORDER — LACTATED RINGERS IV SOLN: 500.0000 mL | INTRAVENOUS | Status: DC | PRN

## 2020-06-28 MED ORDER — BUTORPHANOL TARTRATE 1 MG/ML IJ SOLN
1.0000 mg | INTRAMUSCULAR | Status: DC | PRN
  Administered 2020-06-29 (×2): 1 mg via INTRAVENOUS
  Filled 2020-06-28 (×2): qty 1

## 2020-06-28 MED ORDER — MISOPROSTOL 25 MCG QUARTER TABLET
25.0000 ug | ORAL_TABLET | Freq: Once | ORAL | Status: AC
  Administered 2020-06-28: 25 ug via BUCCAL

## 2020-06-28 MED ORDER — ACETAMINOPHEN 325 MG PO TABS: 650.0000 mg | ORAL_TABLET | ORAL | Status: DC | PRN

## 2020-06-28 MED ORDER — LACTATED RINGERS IV SOLN: INTRAVENOUS | Status: DC

## 2020-06-28 MED ORDER — OXYTOCIN 10 UNIT/ML IJ SOLN
INTRAMUSCULAR | Status: AC
  Filled 2020-06-28: qty 2

## 2020-06-28 MED ORDER — ONDANSETRON HCL 4 MG/2ML IJ SOLN
4.0000 mg | Freq: Four times a day (QID) | INTRAMUSCULAR | Status: DC | PRN
  Administered 2020-06-29: 4 mg via INTRAVENOUS
  Filled 2020-06-28: qty 2

## 2020-06-28 MED ORDER — LIDOCAINE HCL (PF) 1 % IJ SOLN
30.0000 mL | INTRAMUSCULAR | Status: DC | PRN
  Filled 2020-06-28: qty 30

## 2020-06-28 MED ORDER — AMMONIA AROMATIC IN INHA
RESPIRATORY_TRACT | Status: AC
  Filled 2020-06-28: qty 10

## 2020-06-28 MED ORDER — MISOPROSTOL 200 MCG PO TABS
ORAL_TABLET | ORAL | Status: AC
  Administered 2020-06-28: 25 ug
  Filled 2020-06-28: qty 4

## 2020-06-28 MED ORDER — TERBUTALINE SULFATE 1 MG/ML IJ SOLN: 0.2500 mg | Freq: Once | INTRAMUSCULAR | Status: DC | PRN

## 2020-06-28 MED ORDER — MISOPROSTOL 25 MCG QUARTER TABLET
25.0000 ug | ORAL_TABLET | ORAL | Status: DC | PRN
  Administered 2020-06-28 – 2020-06-29 (×3): 25 ug via VAGINAL
  Filled 2020-06-28 (×4): qty 1

## 2020-06-28 NOTE — Progress Notes (Signed)
Updated H and P  History and Physical Interval Note:  06/28/2020 9:25 AM      Kylie [name]  has presented today for INDUCTION OF LABOR (cervical ripening agents),  with the diagnosis of gestational diabetes. The various methods of treatment have been discussed with the patient and family. After consideration of risks, benefits and other options for treatment, the patient has consented to  Labor induction .  The patient's history has been reviewed, patient examined, no change in status, and is stable for induction as planned.  See H&P. I have reviewed the patient's chart and labs.  Questions were answered to the patient's satisfaction.    Mirna Mires, CNM  06/28/2020 9:26 AM   [facility], [facility] 06/28/2020  9:25 AM

## 2020-06-28 NOTE — Progress Notes (Signed)
Kylie [name] is a 30 y.o. G3P2002 at [redacted]w[redacted]d by ultrasound admitted for induction of labor due to Gestational diabetes.  Subjective:is feeling her contractions, and they are stronger now.  Objective: BP 124/82 (BP Location: Left Arm)   Pulse 89   Temp 98.7 F (37.1 C) (Oral)   Resp 14   Ht 5\' 10"  (1.778 m)   Wt 96.2 kg   LMP 10/08/2019 (Within Days)   BMI 30.42 kg/m  No intake/output data recorded. No intake/output data recorded.  FHT:  FHR: 130 baseline bpm, variability: moderate,  accelerations:  Present,  decelerations:  Absent UC:   regular, every 2-3 minutes SVE:   Dilation: 3 Effacement (%): 50 Station: -3 Exam by:: [email or web address] CNM  Pitocin at 7 mu/min  Labs: Lab Results  Component Value Date   WBC 6.2 06/28/2020   HGB 10.7 (L) 06/28/2020   HCT 31.4 (L) 06/28/2020   MCV 84.4 06/28/2020   PLT 135 (L) 06/28/2020    Assessment / Plan: Induction of labor due to gestational diabetes,  progressing on pitocin  Labor: Progressing on Pitocin, will continue to increase then AROM  Fetal Wellbeing:  Category I Pain Control:  Labor support without medications I/D:  n/a Anticipated MOD:  NSVD  Will advance pitocin and recheck in several hours.  08/28/2020 06/28/2020, 6:13 PM

## 2020-06-28 NOTE — Progress Notes (Signed)
Kylie [name] is a 30 y.o. G3P2002 at [redacted]w[redacted]d by ultrasound admitted for induction of labor due to Gestational diabetes.  Subjective:she received both buccal and vaginal Cytotec at 0910 this morning, and is having regular mild contractions, some of which she feels in her back, and others in the front. They are not painful to her. She denies any LOF or bloody show.  Objective: BP 124/79 (BP Location: Left Arm)   Pulse 96   Temp 98.5 F (36.9 C) (Oral)   Resp (!) 164   Ht 5\' 10"  (1.778 m)   Wt 96.2 kg   LMP 10/08/2019 (Within Days)   BMI 30.42 kg/m  No intake/output data recorded. No intake/output data recorded.  FHT:  FHR: 125 bpm, variability: moderate,  accelerations:  Present,  Decelerations:several deecls noted that were eliminated with position change. UC:   regular, every 2-3 minutes, mild SVE:   Dilation: 2 Effacement (%): 50 Station: -3 Exam by:: [email or web address] CNM  Bishop score:3  Labs: Lab Results  Component Value Date   WBC 6.2 06/28/2020   HGB 10.7 (L) 06/28/2020   HCT 31.4 (L) 06/28/2020   MCV 84.4 06/28/2020   PLT 135 (L) 06/28/2020    Assessment / Plan: Induction of labor due to gestational diabetes  Labor: ongoing cervical ripening. Anticipate starting pitocin in several hours  Fetal Wellbeing:  Category II Pain Control:  Labor support without medications I/D:  n/a Anticipated MOD:  NSVD  08/28/2020 06/28/2020, 12:05 PM

## 2020-06-28 NOTE — Progress Notes (Signed)
Kylie [name] is a 30 y.o. G3P2002 at [redacted]w[redacted]d by ultrasound admitted for induction of labor due to Gestational diabetes.  Subjective: she is uncomfortable, but her contractions strength has not changed dramatically despite an increase in her pitocin rate. She has tried position changes and used [employer] with [place], her labor Charity fundraiser.  Objective: BP 123/83   Pulse 75   Temp 98.2 F (36.8 C)   Resp 14   Ht 5\' 10"  (1.778 m)   Wt 96.2 kg   LMP 10/08/2019 (Within Days)   BMI 30.42 kg/m  I/O last 3 completed shifts: In: 1262.9 [I.V.:1262.9] Out: -  No intake/output data recorded.  FHT:  FHR: 130 baseline bpm, variability: moderate,  accelerations:  Present,  decelerations:  Absent UC:   regular, every 2-3 minutes SVE:   Dilation: 3 Effacement (%): 50 Station: -3 Exam by:: [email or web address] CNM  Pitocin at 14 mu/min  Labs: Lab Results  Component Value Date   WBC 6.2 06/28/2020   HGB 10.7 (L) 06/28/2020   HCT 31.4 (L) 06/28/2020   MCV 84.4 06/28/2020   PLT 135 (L) 06/28/2020    Assessment / Plan: Induction of labor due to gestational diabetes, little change on pitocin  Labor: Still in latent phase. Little cervical chyange over hours of pitocin.  Fetal Wellbeing:  Category I Pain Control:  Labor support without medications I/D:  n/a Anticipated MOD:  NSVD   Discussed option of continuing with pitocin vs stopping the pitocin and placing another cytotec to enhance cervical effaceeeeeeement and cervical softening. Mutually opt to stop the pitocin at this time and another Cytotec is placed vaginally by the cervix.  Will be rechecked in 4 hours and based on change, either restart pitocin or repeat another Cytotec dose.  08/28/2020, CNM  06/28/2020 9:47 PM    08/28/2020 Claris Che 06/28/2020, 9:42 PM

## 2020-06-29 ENCOUNTER — Inpatient Hospital Stay: Payer: Medicaid Other | Admitting: Anesthesiology

## 2020-06-29 ENCOUNTER — Encounter: Payer: Self-pay | Admitting: Obstetrics and Gynecology

## 2020-06-29 DIAGNOSIS — O24420 Gestational diabetes mellitus in childbirth, diet controlled: Principal | ICD-10-CM

## 2020-06-29 DIAGNOSIS — Z3A39 39 weeks gestation of pregnancy: Secondary | ICD-10-CM | POA: Diagnosis not present

## 2020-06-29 DIAGNOSIS — Z3A37 37 weeks gestation of pregnancy: Secondary | ICD-10-CM | POA: Diagnosis not present

## 2020-06-29 DIAGNOSIS — O24419 Gestational diabetes mellitus in pregnancy, unspecified control: Secondary | ICD-10-CM | POA: Diagnosis not present

## 2020-06-29 DIAGNOSIS — Z302 Encounter for sterilization: Secondary | ICD-10-CM

## 2020-06-29 LAB — GLUCOSE, CAPILLARY
Glucose-Capillary: 74 mg/dL (ref 70–99)
Glucose-Capillary: 80 mg/dL (ref 70–99)
Glucose-Capillary: 89 mg/dL (ref 70–99)

## 2020-06-29 LAB — RPR: RPR Ser Ql: NONREACTIVE

## 2020-06-29 MED ORDER — IBUPROFEN 600 MG PO TABS
600.0000 mg | ORAL_TABLET | Freq: Four times a day (QID) | ORAL | Status: DC
  Administered 2020-06-29 – 2020-07-01 (×9): 600 mg via ORAL
  Filled 2020-06-29 (×9): qty 1

## 2020-06-29 MED ORDER — BENZOCAINE-MENTHOL 20-0.5 % EX AERO
1.0000 "application " | INHALATION_SPRAY | CUTANEOUS | Status: DC | PRN
  Administered 2020-06-30: 1 via TOPICAL
  Filled 2020-06-29: qty 56

## 2020-06-29 MED ORDER — PHENYLEPHRINE 40 MCG/ML (10ML) SYRINGE FOR IV PUSH (FOR BLOOD PRESSURE SUPPORT): 80.0000 ug | PREFILLED_SYRINGE | INTRAVENOUS | Status: DC | PRN

## 2020-06-29 MED ORDER — DIPHENHYDRAMINE HCL 25 MG PO CAPS: 25.0000 mg | ORAL_CAPSULE | Freq: Four times a day (QID) | ORAL | Status: DC | PRN

## 2020-06-29 MED ORDER — LIDOCAINE HCL (PF) 1 % IJ SOLN
INTRAMUSCULAR | Status: DC | PRN
  Administered 2020-06-29: 3 mL via SUBCUTANEOUS

## 2020-06-29 MED ORDER — OXYCODONE-ACETAMINOPHEN 5-325 MG PO TABS: 2.0000 | ORAL_TABLET | ORAL | Status: DC | PRN

## 2020-06-29 MED ORDER — ONDANSETRON HCL 4 MG/2ML IJ SOLN: 4.0000 mg | INTRAMUSCULAR | Status: DC | PRN

## 2020-06-29 MED ORDER — SIMETHICONE 80 MG PO CHEW: 80.0000 mg | CHEWABLE_TABLET | ORAL | Status: DC | PRN

## 2020-06-29 MED ORDER — EPHEDRINE 5 MG/ML INJ: 10.0000 mg | INTRAVENOUS | Status: DC | PRN

## 2020-06-29 MED ORDER — SENNOSIDES-DOCUSATE SODIUM 8.6-50 MG PO TABS
2.0000 | ORAL_TABLET | ORAL | Status: DC
  Administered 2020-06-29 – 2020-07-01 (×3): 2 via ORAL
  Filled 2020-06-29 (×3): qty 2

## 2020-06-29 MED ORDER — WITCH HAZEL-GLYCERIN EX PADS: 1.0000 "application " | MEDICATED_PAD | CUTANEOUS | Status: DC | PRN

## 2020-06-29 MED ORDER — BUPIVACAINE HCL (PF) 0.25 % IJ SOLN
INTRAMUSCULAR | Status: DC | PRN
  Administered 2020-06-29 (×2): 4 mL via EPIDURAL

## 2020-06-29 MED ORDER — SODIUM CHLORIDE 0.9 % IV SOLN: 250.0000 mL | INTRAVENOUS | Status: DC | PRN

## 2020-06-29 MED ORDER — SODIUM CHLORIDE 0.9% FLUSH
3.0000 mL | Freq: Two times a day (BID) | INTRAVENOUS | Status: DC
  Administered 2020-06-30: 3 mL via INTRAVENOUS

## 2020-06-29 MED ORDER — ONDANSETRON HCL 4 MG PO TABS: 4.0000 mg | ORAL_TABLET | ORAL | Status: DC | PRN

## 2020-06-29 MED ORDER — DIPHENHYDRAMINE HCL 50 MG/ML IJ SOLN: 12.5000 mg | INTRAMUSCULAR | Status: DC | PRN

## 2020-06-29 MED ORDER — COCONUT OIL OIL: 1.0000 "application " | TOPICAL_OIL | Status: DC | PRN

## 2020-06-29 MED ORDER — LACTATED RINGERS IV SOLN: 500.0000 mL | Freq: Once | INTRAVENOUS | Status: DC

## 2020-06-29 MED ORDER — ZOLPIDEM TARTRATE 5 MG PO TABS
5.0000 mg | ORAL_TABLET | Freq: Every evening | ORAL | Status: DC | PRN
Start: 2020-06-29 — End: 2020-07-01

## 2020-06-29 MED ORDER — OXYCODONE-ACETAMINOPHEN 5-325 MG PO TABS
1.0000 | ORAL_TABLET | ORAL | Status: DC | PRN
  Administered 2020-06-30 – 2020-07-01 (×5): 1 via ORAL
  Filled 2020-06-29 (×5): qty 1

## 2020-06-29 MED ORDER — DIBUCAINE (PERIANAL) 1 % EX OINT: 1.0000 "application " | TOPICAL_OINTMENT | CUTANEOUS | Status: DC | PRN

## 2020-06-29 MED ORDER — FENTANYL 2.5 MCG/ML W/ROPIVACAINE 0.15% IN NS 100 ML EPIDURAL (ARMC)
12.0000 mL/h | EPIDURAL | Status: DC
  Filled 2020-06-29: qty 100

## 2020-06-29 MED ORDER — ACETAMINOPHEN 325 MG PO TABS
650.0000 mg | ORAL_TABLET | ORAL | Status: DC | PRN
  Administered 2020-06-29 – 2020-06-30 (×3): 650 mg via ORAL
  Filled 2020-06-29 (×3): qty 2

## 2020-06-29 MED ORDER — LIDOCAINE-EPINEPHRINE (PF) 1.5 %-1:200000 IJ SOLN
INTRAMUSCULAR | Status: DC | PRN
  Administered 2020-06-29: 4 mL via PERINEURAL

## 2020-06-29 MED ORDER — SODIUM CHLORIDE 0.9% FLUSH: 3.0000 mL | INTRAVENOUS | Status: DC | PRN

## 2020-06-29 MED ORDER — FENTANYL 2.5 MCG/ML W/ROPIVACAINE 0.15% IN NS 100 ML EPIDURAL (ARMC)
EPIDURAL | Status: DC | PRN
  Administered 2020-06-29: 12 mL/h via EPIDURAL

## 2020-06-29 NOTE — Discharge Summary (Signed)
Postpartum Discharge Summary  Patient Name: Kylie [name] DOB: 28-Jan-1991 MRN: [id number]  Date of admission: 06/28/2020 Delivery date:06/29/2020  Delivering provider: Gae Dry  Date of discharge: 07/01/2020  Admitting diagnosis: Gestational diabetes mellitus (GDM) affecting third pregnancy [O24.419] Intrauterine pregnancy: [redacted]w[redacted]d    Secondary diagnosis:  Principal Problem:   Encounter for planned induction of labor Active Problems:   Supervision of high risk pregnancy in third trimester   [redacted] weeks gestation of pregnancy   Admission for sterilization   Unwanted fertility   Gestational diabetes mellitus (GDM) affecting third pregnancy  Additional problems: Gestational Diabetes    Discharge diagnosis: Term Pregnancy Delivered, GDM A1 and unwanted fertility                                              Post partum procedures:postpartum tubal ligation Augmentation: Pitocin and Cytotec Complications: None  Hospital course: Induction of Labor With Vaginal Delivery   30y.o. yo G3P2002 at 360w1das admitted to the hospital 06/28/2020 for induction of labor.  Indication for induction: A1 DM.  Patient had an uncomplicated labor course as follows: Membrane Rupture Time/Date: 10:08 AM ,06/29/2020   Delivery Method:Vaginal, Spontaneous  Episiotomy: None  Lacerations:  2nd degree  Details of delivery can be found in separate delivery note.  Patient had a routine postpartum course. On PPD#1, the patient underwent a tubal ligation, which occurred without difficulty.  Patient is discharged home 07/01/20. She is ambulating, tolerating PO, her pain is well controlled on PO medication, she is voiding spontaneously.   Newborn Data: Birth date:06/29/2020  Birth time:11:27 AM  Gender:Female  "Raphael" Living status:Living  Apgars:8 ,9  Weight:3980 g   Magnesium Sulfate received: No BMZ received: No Rhophylac:No MMR:No T-DaP: Would like to get at 6 weeks postpartum Flu:  Declined Transfusion:No  Physical exam  Vitals:   06/30/20 1929 06/30/20 2302 07/01/20 0330 07/01/20 0800  BP: 128/87 119/74 123/80 113/80  Pulse: 93 88 84 86  Resp: '18 18 18 18  ' Temp: 98 F (36.7 C) 98 F (36.7 C) 98.3 F (36.8 C) 98.1 F (36.7 C)  TempSrc: Oral Oral Oral Oral  SpO2: 99% 98% 98% 98%  Weight:      Height:       General: alert, cooperative and no distress Lochia: appropriate Uterine Fundus: firm Incision: Healing well with no significant drainage, Dressing is clean, dry, and intact DVT Evaluation: No evidence of DVT seen on physical exam. No cords or calf tenderness. No significant calf/ankle edema. Labs: Lab Results  Component Value Date   WBC 6.3 06/30/2020   HGB 10.4 (L) 06/30/2020   HCT 29.9 (L) 06/30/2020   MCV 84.7 06/30/2020   PLT 122 (L) 06/30/2020   CMP Latest Ref Rng & Units 04/14/2020  Glucose 70 - 99 mg/dL 106(H)  BUN 6 - 20 mg/dL 7  Creatinine 0.44 - 1.00 mg/dL 0.41(L)  Sodium 135 - 145 mmol/L 137  Potassium 3.5 - 5.1 mmol/L 3.5  Chloride 98 - 111 mmol/L 104  CO2 22 - 32 mmol/L 21(L)  Calcium 8.9 - 10.3 mg/dL 8.6(L)  Total Protein 6.5 - 8.1 g/dL 7.0  Total Bilirubin 0.3 - 1.2 mg/dL 0.7  Alkaline Phos 38 - 126 U/L 85  AST 15 - 41 U/L 22  ALT 0 - 44 U/L 21   Edinburgh Score: Edinburgh Postnatal Depression  Scale Screening Tool 06/30/2020  I have been able to laugh and see the funny side of things. 0  I have looked forward with enjoyment to things. 1  I have blamed myself unnecessarily when things went wrong. 1  I have been anxious or worried for no good reason. 2  I have felt scared or panicky for no good reason. 1  Things have been getting on top of me. 1  I have been so unhappy that I have had difficulty sleeping. 1  I have felt sad or miserable. 0  I have been so unhappy that I have been crying. 0  The thought of harming myself has occurred to me. 0  Edinburgh Postnatal Depression Scale Total 7      After visit meds:   Allergies as of 07/01/2020   No Known Allergies     Medication List    STOP taking these medications   promethazine 25 MG tablet Commonly known as: PHENERGAN     TAKE these medications   acetaminophen 500 MG tablet Commonly known as: TYLENOL Take 2 tablets (1,000 mg total) by mouth every 8 (eight) hours as needed (for pain scale < 4).   ibuprofen 600 MG tablet Commonly known as: ADVIL Take 1 tablet (600 mg total) by mouth every 6 (six) hours.   oxyCODONE-acetaminophen 5-325 MG tablet Commonly known as: PERCOCET/ROXICET Take 1 tablet by mouth every 6 (six) hours as needed (breakthrough pain).   PRENATAL VITAMINS PO Take 1 tablet by mouth daily.            Discharge Care Instructions  (From admission, onward)         Start     Ordered   07/01/20 0000  Discharge wound care:       Comments: Perform wound care instructions   07/01/20 1124           Discharge home in stable condition Infant Feeding: Breast Infant Disposition:home with mother Discharge instruction: per After Visit Summary and Postpartum booklet. Activity: Advance as tolerated. Pelvic rest for 6 weeks.  Diet: routine diet Anticipated Birth Control: BTL done PP Postpartum Appointment:6 weeks Additional Postpartum F/U: none Future Appointments:No future appointments. Follow up Visit:  Follow-up Information    Gae Dry, MD. Schedule an appointment as soon as possible for a visit in 6 week(s).   Specialty: Obstetrics and Gynecology Contact information: [address] [place] [address] [phone]               SIGNED:  Prentice Docker, MD, [facility], [facility] 07/01/2020 11:28 AM

## 2020-06-29 NOTE — Progress Notes (Signed)
RN removed epidural catheter. When catheter removed site was clean and dry and tip was intact on epidural catheter.

## 2020-06-29 NOTE — Progress Notes (Addendum)
  Labor Progress Note   30 y.o. [id number] @ [redacted]w[redacted]d , admitted for  Pregnancy, Labor Management. IOL/GDM  Subjective:  Comfortable with epidura  Objective:  BP 124/87   Pulse 74   Temp 98.6 F (37 C) (Oral)   Resp 18   Ht 5\' 10"  (1.778 m)   Wt 96.2 kg   LMP 10/08/2019 (Within Days)   SpO2 99%   BMI 30.42 kg/m  Abd: gravid, ND, FHT present, mild tenderness on exam Extr: no edema SVE: CERVIX: 7 cm dilated, 80 effaced, 0 station  EFM: FHR has been 120s with moderate variability, +accelerations and -decelerations. Just received epidural followed by SROM and late decelerations. Continued good variability throughout and resolution of lates with position changes and bolus of fluid Toco: Frequency: Every 2 minutes Labs: I have reviewed the patient's lab results.   Assessment & Plan:  10/10/2019 @ [redacted]w[redacted]d, admitted for  Pregnancy and Labor/Delivery Management  1. Pain management: epidural. 2. FWB: FHT category II and overall reassuring.  3. ID: GBS negative 4. Labor management: decrease pitocin to 7 mU/min given SROM and fetal decelerations  All discussed with patient, see orders   [redacted]w[redacted]d, CNM [facility] 06/29/2020  10:39 AM

## 2020-06-29 NOTE — Anesthesia Preprocedure Evaluation (Signed)
Anesthesia Evaluation  Patient identified by MRN, date of birth, ID band Patient awake    Reviewed: Allergy & Precautions, H&P , NPO status , Patient's Chart, lab work & pertinent test results  History of Anesthesia Complications Negative for: history of anesthetic complications  Airway Mallampati: III     Mouth opening: Limited Mouth Opening  Dental no notable dental hx.    Pulmonary former smoker,           Cardiovascular      Neuro/Psych  Headaches, PSYCHIATRIC DISORDERS Anxiety Depression    GI/Hepatic   Endo/Other  diabetes, Gestational  Renal/GU      Musculoskeletal   Abdominal   Peds  Hematology  (+) Blood dyscrasia, anemia ,   Anesthesia Other Findings   Reproductive/Obstetrics (+) Pregnancy                             Anesthesia Physical Anesthesia Plan  ASA: III  Anesthesia Plan: Epidural   Post-op Pain Management:    Induction:   PONV Risk Score and Plan:   Airway Management Planned:   Additional Equipment:   Intra-op Plan:   Post-operative Plan:   Informed Consent: I have reviewed the patients History and Physical, chart, labs and discussed the procedure including the risks, benefits and alternatives for the proposed anesthesia with the patient or authorized representative who has indicated his/her understanding and acceptance.       Plan Discussed with: Anesthesiologist  Anesthesia Plan Comments:         Anesthesia Quick Evaluation

## 2020-06-29 NOTE — Anesthesia Procedure Notes (Signed)
Epidural Patient location during procedure: OB Start time: 06/29/2020 9:34 AM End time: 06/29/2020 9:58 AM  Staffing Anesthesiologist: Piscitello, Cleda Mccreedy, MD Resident/CRNA: Irving Burton, CRNA Performed: resident/CRNA   Preanesthetic Checklist Completed: patient identified, IV checked, site marked, risks and benefits discussed, surgical consent, monitors and equipment checked, pre-op evaluation and timeout performed  Epidural Patient position: sitting Prep: ChloraPrep Patient monitoring: heart rate, continuous pulse ox and blood pressure Approach: midline Location: L4-L5 Injection technique: LOR air  Needle:  Needle type: Tuohy  Needle gauge: 17 G Needle length: 9 cm and 9 Needle insertion depth: 6 cm Catheter type: closed end flexible Catheter size: 19 Gauge Catheter at skin depth: 11 cm Test dose: negative and 1.5% lidocaine with Epi 1:200 K  Assessment Sensory level: T10 Events: blood not aspirated, injection not painful, no injection resistance, no paresthesia and negative IV test  Additional Notes 2 attempt Pt. Evaluated and documentation done after procedure finished. Patient identified. Risks/Benefits/Options discussed with patient including but not limited to bleeding, infection, nerve damage, paralysis, failed block, incomplete pain control, headache, blood pressure changes, nausea, vomiting, reactions to medication both or allergic, itching and postpartum back pain. Confirmed with bedside nurse the patient's most recent platelet count. Confirmed with patient that they are not currently taking any anticoagulation, have any bleeding history or any family history of bleeding disorders. Patient expressed understanding and wished to proceed. All questions were answered. Sterile technique was used throughout the entire procedure. Please see nursing notes for vital signs. Test dose was given through epidural catheter and negative prior to continuing to dose epidural or  start infusion. Warning signs of high block given to the patient including shortness of breath, tingling/numbness in hands, complete motor block, or any concerning symptoms with instructions to call for help. Patient was given instructions on fall risk and not to get out of bed. All questions and concerns addressed with instructions to call with any issues or inadequate analgesia.   Patient tolerated the insertion well without immediate complications.Reason for block:procedure for pain

## 2020-06-29 NOTE — Progress Notes (Signed)
Kylie [name] is a 30 y.o. G3P2002 at [redacted]w[redacted]d by ultrasound admitted for induction of labor due to Gestational diabetes.  Subjective: she has rested some during the night. Restarted on pitocin this last hour after two doses of Cytotec during the night.  She is more uncomfortable now, and was given Stadol IVP at 0100 which eased her discomfort.  Objective: BP 135/73   Pulse 84   Temp 98 F (36.7 C) (Oral)   Resp 14   Ht 5\' 10"  (1.778 m)   Wt 96.2 kg   LMP 10/08/2019 (Within Days)   BMI 30.42 kg/m  I/O last 3 completed shifts: In: 1262.9 [I.V.:1262.9] Out: -  No intake/output data recorded.  FHT:  FHR: 130 bpm, variability: moderate,  accelerations:  Present,  decelerations:  Absent UC:   regular, every 2-3.5 minutes SVE:   Dilation: 4 Effacement (%): 70 Station: -3 Exam by:: [email or web address] RN  Pitocin infusing at 65mu/min  Labs: Lab Results  Component Value Date   WBC 6.2 06/28/2020   HGB 10.7 (L) 06/28/2020   HCT 31.4 (L) 06/28/2020   MCV 84.4 06/28/2020   PLT 135 (L) 06/28/2020    Assessment / Plan: Induction of labor due to gestational diabetes,  progressing well on pitocin  Labor: Progressing on Pitocin, will continue to increase then AROM  Fetal Wellbeing:  Category I Pain Control:  IV pain meds I/D:  n/a Anticipated MOD:  NSVD    08/28/2020 06/29/2020, 6:41 AM

## 2020-06-29 NOTE — Discharge Instructions (Signed)
Postpartum Care After Vaginal Delivery The following information offers guidance about how to care for yourself from the time you deliver your baby to 6-12 weeks after delivery (postpartum period). If you have problems or questions, contact your health care provider for more specific instructions. Follow these instructions at home: Vaginal bleeding  It is normal to have vaginal bleeding (lochia) after delivery. Wear a sanitary pad for bleeding and discharge. ? During the first week after delivery, the amount and appearance of lochia is often similar to a menstrual period. ? Over the next few weeks, it will gradually decrease to a dry, yellow-brown discharge. ? For most women, lochia stops completely by 4-6 weeks after delivery, but can vary.  Change your sanitary pads frequently. Watch for any changes in your flow, such as: ? A sudden increase in volume. ? A change in color. ? Large blood clots.  If you pass a blood clot from your vagina, save it and call your health care provider. Do not flush blood clots down the toilet before talking with your health care provider.  Do not use tampons or douches until your health care provider approves.  If you are not breastfeeding, your period should return 6-8 weeks after delivery. If you are feeding your baby breast milk only, your period may not return until you stop breastfeeding. Perineal care  Keep the area between the vagina and the anus (perineum) clean and dry. Use medicated pads and pain-relieving sprays and creams as directed.  If you had a surgical cut in the perineum (episiotomy) or a tear, check the area for signs of infection until you are healed. Check for: ? More redness, swelling, or pain. ? Fluid or blood coming from the cut or tear. ? Warmth. ? Pus or a bad smell.  You may be given a squirt bottle to use instead of wiping to clean the perineum area after you use the bathroom. Pat the area gently to dry it.  To relieve pain  caused by an episiotomy, a tear, or swollen veins in the anus (hemorrhoids), take a warm sitz bath 2-3 times a day. In a sitz bath, the warm water should only come up to your hips and cover your buttocks.   Breast care  In the first few days after delivery, your breasts may feel heavy, full, and uncomfortable (breast engorgement). Milk may also leak from your breasts. Ask your health care provider about ways to help relieve the discomfort.  If you are breastfeeding: ? Wear a bra that supports your breasts and fits well. Use breast pads to absorb milk that leaks. ? Keep your nipples clean and dry. Apply creams and ointments as told. ? You may have uterine contractions every time you breastfeed for up to several weeks after delivery. This helps your uterus return to its normal size. ? If you have any problems with breastfeeding, notify your health care provider or lactation consultant.  If you are not breastfeeding: ? Avoid touching your breasts. Do not squeeze out (express) milk. Doing this can make your breasts produce more milk. ? Wear a good-fitting bra and use cold packs to help with swelling. Intimacy and sexuality  Ask your health care provider when you can engage in sexual activity. This may depend upon: ? Your risk of infection. ? How fast you are healing. ? Your comfort and desire to engage in sexual activity.  You are able to get pregnant after delivery, even if you have not had your period. Talk with   your health care provider about methods of birth control (contraception) or family planning if you desire future pregnancies. °Medicines °· Take over-the-counter and prescription medicines only as told by your health care provider. °· Take an over-the-counter stool softener to help ease bowel movements as told by your health care provider. °· If you were prescribed an antibiotic medicine, take it as told by your health care provider. Do not stop taking the antibiotic even if you start to  feel better. °· Review all previous and current prescriptions to check for possible transfer into breast milk. °Activity °· Gradually return to your normal activities as told by your health care provider. °· Rest as much as possible. Nap while your baby is sleeping. °Eating and drinking °· Drink enough fluid to keep your urine pale yellow. °· To help prevent or relieve constipation, eat high-fiber foods every day. °· Choose healthy eating to support breastfeeding or weight loss goals. °· Take your prenatal vitamins until your health care provider tells you to stop.   °General tips/recommendations °· Do not use any products that contain nicotine or tobacco. These products include cigarettes, chewing tobacco, and vaping devices, such as e-cigarettes. If you need help quitting, ask your health care provider. °· Do not drink alcohol, especially if you are breastfeeding. °· Do not take medications or drugs that are not prescribed to you, especially if you are breastfeeding. °· Visit your health care provider for a postpartum checkup within the first 3-6 weeks after delivery. °· Complete a comprehensive postpartum visit no later than 12 weeks after delivery. °· Keep all follow-up visits for you and your baby. °Contact a health care provider if: °· You feel unusually sad or worried. °· Your breasts become red, painful, or hard. °· You have a fever or other signs of an infection. °· You have bleeding that is soaking through one pad an hour or you have blood clots. °· You have a severe headache that doesn't go away or you have vision changes. °· You have nausea and vomiting and are unable to eat or drink anything for 24 hours. °Get help right away if: °· You have chest pain or difficulty breathing. °· You have sudden, severe leg pain. °· You faint or have a seizure. °· You have thoughts about hurting yourself or your baby. °If you ever feel like you may hurt yourself or others, or have thoughts about taking your own life,  get help right away. Go to your nearest emergency department or: °· Call your local emergency services (911 in the [place] °· The National Suicide Prevention Lifeline at 1-800-273-8255. This suicide crisis helpline is open 24 hours a day. °· Text the Crisis Text Line at 741741 (in the [place] °Summary °· The period of time after you deliver your newborn up to 6-12 weeks after delivery is called the postpartum period. °· Keep all follow-up visits for you and your baby. °· Review all previous and current prescriptions to check for possible transfer into breast milk. °· Contact a health care provider if you feel unusually sad or worried during the postpartum period. °This information is not intended to replace advice given to you by your health care provider. Make sure you discuss any questions you have with your health care provider. °Document Revised: 11/25/2019 Document Reviewed: 11/25/2019 °Elsevier Patient Education © 2021 [employer] °Postpartum Tubal Ligation, Care After °The following information offers guidance on how to care for yourself after your procedure. Your health care provider may also give you   give you more specific instructions. If you have problems or questions, contact your health care provider. What can I expect after the procedure? After the procedure, you may have:  A sore throat if general anesthesia was used.  Bruising or pain in your back.  Nausea or vomiting.  Dizziness.  Mild abdominal discomfort or pain, such as cramping, gas pain, or feeling bloated.  Soreness around the incision area.  Tiredness.  Pain in your shoulders. This is caused by the gas that was used during the procedure. Follow these instructions at home: Medicines  Ask your health care provider if the medicine prescribed to you: ? Requires you to avoid driving or using heavy machinery. ? Can cause constipation. You may need to take these actions to prevent or treat constipation:  Drink enough fluid to  keep your urine pale yellow.  Take over-the-counter or prescription medicines.  Eat foods that are high in fiber, such as beans, whole grains, and fresh fruits and vegetables.  Limit foods that are high in fat and processed sugars, such as fried or sweet foods.  Do not take aspirin because it can cause bleeding. Activity  Rest for the remainder of the day.  Avoid sitting for a long time without moving. Get up to take short walks every 1-2 hours. This is important to improve blood flow and breathing. Ask for help if you feel weak or unsteady.  Do not have sex, douche, or put a tampon or anything else in your vagina for 6 weeks or as long as told by your health care provider.  Do not lift anything that is heavier than your baby for 2 weeks, or the limit that you are told, until your health care provider says that it is safe.  Return to your normal activities as told by your health care provider. Ask your health care provider what activities are safe for you. Incision care  Follow instructions from your health care provider about how to take care of your incision. Make sure you: ? Wash your hands with soap and water for at least 20 seconds before and after you change your bandage (dressing). If soap and water are not available, use hand sanitizer. ? Change your dressing as told by your health care provider. ? Leave stitches (sutures), skin glue, or adhesive strips in place. These skin closures may need to stay in place for 2 weeks or longer. If adhesive strip edges start to loosen and curl up, you may trim the loose edges. Do not remove adhesive strips completely unless your health care provider tells you to do that.  Check your incision area every day for signs of infection. Check for: ? Redness, swelling, or more pain. ? Fluid or blood. ? Warmth. ? Pus or a bad smell.      Other Instructions  Do not take baths, swim, or use a hot tub until your health care provider approves. Ask  your health care provider if you may take showers. You may only be allowed to take sponge baths.  Keep all follow-up visits. This is important. Contact a health care provider if:  You have signs of infection, such as: ? Redness, swelling, or more pain around your incision. ? Fluid or blood coming from your incision. ? Your incision feels warm to the touch. ? You have pus or a bad smell coming from your incision.  Your pain does not improve after 2-3 days.  The edges of your incision break open after the sutures have  You have a rash. °· You repeatedly become dizzy or lightheaded. °· You have pain and pain medicine is not helping. °· You are constipated. °Get help right away if: °· You have a fever or chills. °· You faint. °· You have pain in your abdomen that gets worse, and the pain is not relieved by pain medicine. °· You have shortness of breath or trouble breathing. °· You have chest pain, leg pain, or leg swelling. °· You have ongoing nausea or diarrhea. °These symptoms may represent a serious problem that is an emergency. Do not wait to see if the symptoms will go away. Get medical help right away. Call your local emergency services (911 in the [place] Do not drive yourself to the hospital. °Summary °· Mild abdominal discomfort is common after this procedure. °· Contact your health care provider if you experience problems or have concerns. °· Do not lift anything that is heavier than your baby for 2 weeks, or the limit that you are told, until your health care provider says that it is safe. °· Keep all follow-up visits. This is important. °This information is not intended to replace advice given to you by your health care provider. Make sure you discuss any questions you have with your health care provider. °Document Revised: 11/26/2019 Document Reviewed: 11/26/2019 °Elsevier Patient Education © 2021 [employer] ° °

## 2020-06-29 NOTE — Lactation Note (Signed)
This note was copied from a baby's chart. Lactation Consultation Note  Patient Name: Kylie [name] [id number] Date: 06/29/2020 Reason for consult: Follow-up assessment Age:28 hours   1200- Lactation to L&D for initial visit. Mother is holding the baby skin to skin and cradle on the left. Encouraged feeding on demand and with cues. If baby is not cueing encouraged hand expression and skin to skin. Taught proper technique for hand expression and spoon feeding. Mother expressed a few drops and LC fed the drops to baby with the spoon. to baby. Baby was left skin to skin with Mother.   1510- LC to the room for assistance with feed. Baby is fussy. LC again taught HE, LC is able to express drops of colostrum more easily now. Attempted latch in cross cradle, baby is slipping off. Placed 20 mm nipple shield with colostrum on the tip of the shield. Baby was able to open wide, latched and started to have a rhythmic sucking pattern. LC noted he is dimpling a lot in his cheeks while feeding. Colostrum seen in shield after feed. Reviewed washing and storage of NS.  Encouraged 8 or more attempts in the first 24 hours and 8 or more good feeds after 24 HOL. Reviewed appropriate diapers for days of life and How to know your baby is getting enough to eat. Reviewed "Understanding Postpartum and Newborn Care" booklet at bedside. [facility] # left on board, encouraged to call for any assistance. LC noted while baby is crying a short lingual frenulum and heart shaped tongue. Motehr did not BF her other children but stated they do not have a tongue tie. Mother has no further questions at this time.   Maternal Data Has patient been taught Hand Expression?: Yes Does the patient have breastfeeding experience prior to this delivery?: No  Feeding Mother's Current Feeding Choice: Breast Milk  LATCH Score Latch: Repeated attempts needed to sustain latch, nipple held in mouth throughout feeding, stimulation needed to elicit sucking  reflex.  Audible Swallowing: A few with stimulation (cheek dimpling, oral restriction)  Type of Nipple: Everted at rest and after stimulation (flat, but erect with nipple shield)  Comfort (Breast/Nipple): Soft / non-tender  Hold (Positioning): Assistance needed to correctly position infant at breast and maintain latch.  LATCH Score: 7   Lactation Tools Discussed/Used Tools: Nipple Shields Nipple shield size: 20  Interventions Interventions: Breast feeding basics reviewed;Assisted with latch;Hand express;Breast compression;Adjust position;Support pillows;Position options;Education (nipple shield)  Discharge Pump:  (none) WIC Program: No (needs to sign up)  Consult Status Consult Status: Follow-up Date: 06/30/20 Follow-up type: Call as needed    Anniebell Bedore D Hetvi Shawhan 06/29/2020, 3:57 PM

## 2020-06-30 ENCOUNTER — Inpatient Hospital Stay: Payer: Medicaid Other | Admitting: Anesthesiology

## 2020-06-30 ENCOUNTER — Encounter: Payer: Self-pay | Admitting: Obstetrics and Gynecology

## 2020-06-30 ENCOUNTER — Encounter
Admission: RE | Disposition: A | Discharge status: Home / Self Care | Payer: Self-pay | Source: Home / Self Care | Attending: Obstetrics & Gynecology

## 2020-06-30 DIAGNOSIS — Z302 Encounter for sterilization: Secondary | ICD-10-CM | POA: Diagnosis not present

## 2020-06-30 DIAGNOSIS — Z9851 Tubal ligation status: Secondary | ICD-10-CM | POA: Diagnosis not present

## 2020-06-30 DIAGNOSIS — Z3009 Encounter for other general counseling and advice on contraception: Secondary | ICD-10-CM | POA: Diagnosis present

## 2020-06-30 HISTORY — PX: TUBAL LIGATION: SHX77

## 2020-06-30 LAB — CBC
HCT: 29.9 % — ABNORMAL LOW (ref 36.0–46.0)
Hemoglobin: 10.4 g/dL — ABNORMAL LOW (ref 12.0–15.0)
MCH: 29.5 pg (ref 26.0–34.0)
MCHC: 34.8 g/dL (ref 30.0–36.0)
MCV: 84.7 fL (ref 80.0–100.0)
Platelets: 122 10*3/uL — ABNORMAL LOW (ref 150–400)
RBC: 3.53 MIL/uL — ABNORMAL LOW (ref 3.87–5.11)
RDW: 14.4 % (ref 11.5–15.5)
WBC: 6.3 10*3/uL (ref 4.0–10.5)
nRBC: 0 % (ref 0.0–0.2)

## 2020-06-30 SURGERY — LIGATION, FALLOPIAN TUBE, POSTPARTUM
Anesthesia: General

## 2020-06-30 MED ORDER — BUPIVACAINE HCL (PF) 0.5 % IJ SOLN
INTRAMUSCULAR | Status: AC
  Filled 2020-06-30: qty 30

## 2020-06-30 MED ORDER — ONDANSETRON HCL 4 MG/2ML IJ SOLN: 4.0000 mg | Freq: Once | INTRAMUSCULAR | Status: DC | PRN

## 2020-06-30 MED ORDER — LIDOCAINE HCL (CARDIAC) PF 100 MG/5ML IV SOSY
PREFILLED_SYRINGE | INTRAVENOUS | Status: DC | PRN
  Administered 2020-06-30: 100 mg via INTRAVENOUS

## 2020-06-30 MED ORDER — LACTATED RINGERS IV SOLN: INTRAVENOUS | Status: DC | PRN

## 2020-06-30 MED ORDER — PROPOFOL 10 MG/ML IV BOLUS
INTRAVENOUS | Status: DC | PRN
  Administered 2020-06-30: 160 mg via INTRAVENOUS

## 2020-06-30 MED ORDER — DEXAMETHASONE SODIUM PHOSPHATE 10 MG/ML IJ SOLN
INTRAMUSCULAR | Status: AC
  Filled 2020-06-30: qty 1

## 2020-06-30 MED ORDER — LIDOCAINE HCL (PF) 2 % IJ SOLN
INTRAMUSCULAR | Status: AC
  Filled 2020-06-30: qty 5

## 2020-06-30 MED ORDER — FENTANYL CITRATE (PF) 100 MCG/2ML IJ SOLN
INTRAMUSCULAR | Status: DC | PRN
  Administered 2020-06-30: 100 ug via INTRAVENOUS

## 2020-06-30 MED ORDER — FENTANYL CITRATE (PF) 100 MCG/2ML IJ SOLN
INTRAMUSCULAR | Status: AC
  Administered 2020-06-30: 25 ug via INTRAVENOUS
  Filled 2020-06-30: qty 2

## 2020-06-30 MED ORDER — OXYCODONE HCL 5 MG PO TABS: 5.0000 mg | ORAL_TABLET | Freq: Once | ORAL | Status: AC | PRN

## 2020-06-30 MED ORDER — FENTANYL CITRATE (PF) 100 MCG/2ML IJ SOLN
25.0000 ug | INTRAMUSCULAR | Status: DC | PRN
  Administered 2020-06-30: 50 ug via INTRAVENOUS
  Administered 2020-06-30: 25 ug via INTRAVENOUS

## 2020-06-30 MED ORDER — ONDANSETRON HCL 4 MG/2ML IJ SOLN
INTRAMUSCULAR | Status: DC | PRN
  Administered 2020-06-30: 4 mg via INTRAVENOUS

## 2020-06-30 MED ORDER — FENTANYL CITRATE (PF) 100 MCG/2ML IJ SOLN
INTRAMUSCULAR | Status: AC
  Filled 2020-06-30: qty 2

## 2020-06-30 MED ORDER — MIDAZOLAM HCL 2 MG/2ML IJ SOLN
INTRAMUSCULAR | Status: AC
  Filled 2020-06-30: qty 2

## 2020-06-30 MED ORDER — OXYCODONE HCL 5 MG/5ML PO SOLN: 5.0000 mg | Freq: Once | ORAL | Status: AC | PRN

## 2020-06-30 MED ORDER — EPHEDRINE SULFATE 50 MG/ML IJ SOLN
INTRAMUSCULAR | Status: DC | PRN
  Administered 2020-06-30: 10 mg via INTRAVENOUS
  Administered 2020-06-30: 5 mg via INTRAVENOUS

## 2020-06-30 MED ORDER — FENTANYL CITRATE (PF) 100 MCG/2ML IJ SOLN
INTRAMUSCULAR | Status: AC
  Administered 2020-06-30: 50 ug via INTRAVENOUS
  Filled 2020-06-30: qty 2

## 2020-06-30 MED ORDER — DEXMEDETOMIDINE HCL 200 MCG/2ML IV SOLN
INTRAVENOUS | Status: DC | PRN
  Administered 2020-06-30: 16 ug via INTRAVENOUS

## 2020-06-30 MED ORDER — SUCCINYLCHOLINE CHLORIDE 20 MG/ML IJ SOLN
INTRAMUSCULAR | Status: DC | PRN
  Administered 2020-06-30: 100 mg via INTRAVENOUS

## 2020-06-30 MED ORDER — DEXAMETHASONE SODIUM PHOSPHATE 10 MG/ML IJ SOLN
INTRAMUSCULAR | Status: DC | PRN
  Administered 2020-06-30: 10 mg via INTRAVENOUS

## 2020-06-30 MED ORDER — ONDANSETRON HCL 4 MG/2ML IJ SOLN
INTRAMUSCULAR | Status: AC
  Filled 2020-06-30: qty 2

## 2020-06-30 MED ORDER — EPHEDRINE 5 MG/ML INJ
INTRAVENOUS | Status: AC
  Filled 2020-06-30: qty 10

## 2020-06-30 MED ORDER — OXYCODONE HCL 5 MG PO TABS
ORAL_TABLET | ORAL | Status: AC
  Administered 2020-06-30: 5 mg via ORAL
  Filled 2020-06-30: qty 1

## 2020-06-30 MED ORDER — SUCCINYLCHOLINE CHLORIDE 200 MG/10ML IV SOSY
PREFILLED_SYRINGE | INTRAVENOUS | Status: AC
  Filled 2020-06-30: qty 10

## 2020-06-30 MED ORDER — BUPIVACAINE HCL 0.5 % IJ SOLN
INTRAMUSCULAR | Status: DC | PRN
  Administered 2020-06-30: 4 mL

## 2020-06-30 MED ORDER — MIDAZOLAM HCL 2 MG/2ML IJ SOLN
INTRAMUSCULAR | Status: DC | PRN
  Administered 2020-06-30: 2 mg via INTRAVENOUS

## 2020-06-30 SURGICAL SUPPLY — 25 items
CHLORAPREP W/TINT 26 (MISCELLANEOUS) ×2 IMPLANT
COVER WAND RF STERILE (DRAPES) IMPLANT
DERMABOND ADVANCED (GAUZE/BANDAGES/DRESSINGS) ×1
DERMABOND ADVANCED .7 DNX12 (GAUZE/BANDAGES/DRESSINGS) ×1 IMPLANT
DRAPE LAPAROTOMY 100X77 ABD (DRAPES) ×2 IMPLANT
DRSG TEGADERM 2-3/8X2-3/4 SM (GAUZE/BANDAGES/DRESSINGS) ×2 IMPLANT
DRSG TELFA 4X3 1S NADH ST (GAUZE/BANDAGES/DRESSINGS) ×2 IMPLANT
ELECT CAUTERY BLADE 6.4 (BLADE) ×2 IMPLANT
ELECT REM PT RETURN 9FT ADLT (ELECTROSURGICAL) ×2
ELECTRODE REM PT RTRN 9FT ADLT (ELECTROSURGICAL) ×1 IMPLANT
GLOVE SURG ENC MOIS LTX SZ8 (GLOVE) ×2 IMPLANT
GOWN STRL REUS W/ TWL LRG LVL3 (GOWN DISPOSABLE) ×1 IMPLANT
GOWN STRL REUS W/ TWL XL LVL3 (GOWN DISPOSABLE) ×1 IMPLANT
GOWN STRL REUS W/TWL LRG LVL3 (GOWN DISPOSABLE) ×1
GOWN STRL REUS W/TWL XL LVL3 (GOWN DISPOSABLE) ×1
LABEL OR SOLS (LABEL) ×2 IMPLANT
MANIFOLD NEPTUNE II (INSTRUMENTS) ×2 IMPLANT
NEEDLE HYPO 22GX1.5 SAFETY (NEEDLE) ×2 IMPLANT
NS IRRIG 500ML POUR BTL (IV SOLUTION) ×2 IMPLANT
PACK BASIN MINOR ARMC (MISCELLANEOUS) ×2 IMPLANT
STRAP SAFETY 5IN WIDE (MISCELLANEOUS) ×2 IMPLANT
SUT VIC AB 0 SH 27 (SUTURE) ×4 IMPLANT
SUT VIC AB 2-0 UR6 27 (SUTURE) ×2 IMPLANT
SUT VIC AB 4-0 PS2 18 (SUTURE) ×2 IMPLANT
SYR 10ML LL (SYRINGE) ×2 IMPLANT

## 2020-06-30 NOTE — Anesthesia Procedure Notes (Deleted)
Procedure Name: Intubation Date/Time: 06/30/2020 12:44 PM Performed by: Justus Memory, CRNA Pre-anesthesia Checklist: Patient identified, Patient being monitored, Timeout performed, Emergency Drugs available and Suction available Patient Re-evaluated:Patient Re-evaluated prior to induction Oxygen Delivery Method: Circle system utilized Preoxygenation: Pre-oxygenation with 100% oxygen Induction Type: IV induction Ventilation: Mask ventilation without difficulty Laryngoscope Size: Mac, 3 and Glidescope Grade View: Grade I Tube type: Oral Tube size: 7.0 mm Number of attempts: 1 Airway Equipment and Method: Stylet and Video-laryngoscopy Placement Confirmation: ETT inserted through vocal cords under direct vision,  positive ETCO2 and breath sounds checked- equal and bilateral Secured at: 21 cm Tube secured with: Tape Dental Injury: Teeth and Oropharynx as per pre-operative assessment  Difficulty Due To: Difficulty was anticipated and Difficult Airway- due to anterior larynx Future Recommendations: Recommend- induction with short-acting agent, and alternative techniques readily available

## 2020-06-30 NOTE — Progress Notes (Signed)
Subjective:  No concerns, tolerating PO, voiding.  Minimal lochia  Objective:  Vital signs in last 24 hours: Temp:  [97.5 F (36.4 C)-98.6 F (37 C)] 97.9 F (36.6 C) (04/08 0732) Pulse Rate:  [73-87] 76 (04/08 0732) Resp:  [12-20] 20 (04/08 0732) BP: (107-146)/(52-96) 134/82 (04/08 0732) SpO2:  [97 %-100 %] 99 % (04/08 0732)    General: NAD Pulmonary: no increased work of breathing Abdomen: non-distended, non-tender, fundus firm at level of umbilicus Extremities: no edema, no erythema, no tenderness  Results for orders placed or performed during the hospital encounter of 06/28/20 (from the past 72 hour(s))  CBC     Status: Abnormal   Collection Time: 06/28/20  8:51 AM  Result Value Ref Range   WBC 6.2 4.0 - 10.5 K/uL   RBC 3.72 (L) 3.87 - 5.11 MIL/uL   Hemoglobin 10.7 (L) 12.0 - 15.0 g/dL   HCT 16.1 (L) 09.6 - 04.5 %   MCV 84.4 80.0 - 100.0 fL   MCH 28.8 26.0 - 34.0 pg   MCHC 34.1 30.0 - 36.0 g/dL   RDW 40.9 81.1 - 91.4 %   Platelets 135 (L) 150 - 400 K/uL   nRBC 0.0 0.0 - 0.2 %    Comment: Performed at [facility], [address] [place], [place] [address]  Type and screen     Status: None   Collection Time: 06/28/20  8:51 AM  Result Value Ref Range   ABO/RH(D) O POS    Antibody Screen NEG    Sample Expiration      07/01/2020,2359 Performed at [facility], [address] [place], [place] [address]   RPR     Status: None   Collection Time: 06/28/20  8:51 AM  Result Value Ref Range   RPR Ser Ql NON REACTIVE NON REACTIVE    Comment: Performed at [facility], [address] [place], [place] [address]  Urine Drug Screen, Qualitative ([facility] only)     Status: Abnormal   Collection Time: 06/28/20  8:51 AM  Result Value Ref Range   Tricyclic, Ur Screen NONE DETECTED NONE DETECTED   Amphetamines, Ur Screen NONE DETECTED NONE DETECTED   MDMA (Ecstasy)Ur Screen NONE DETECTED NONE DETECTED   Cocaine Metabolite,Ur [place] NONE DETECTED NONE  DETECTED   Opiate, Ur Screen NONE DETECTED NONE DETECTED   Phencyclidine (PCP) Ur S NONE DETECTED NONE DETECTED   Cannabinoid 50 Ng, Ur [place] POSITIVE (A) NONE DETECTED   Barbiturates, Ur Screen NONE DETECTED NONE DETECTED   Benzodiazepine, Ur Scrn NONE DETECTED NONE DETECTED   Methadone Scn, Ur NONE DETECTED NONE DETECTED    Comment: (NOTE) Tricyclics + metabolites, urine    Cutoff 1000 ng/mL Amphetamines + metabolites, urine  Cutoff 1000 ng/mL MDMA (Ecstasy), urine              Cutoff 500 ng/mL Cocaine Metabolite, urine          Cutoff 300 ng/mL Opiate + metabolites, urine        Cutoff 300 ng/mL Phencyclidine (PCP), urine         Cutoff 25 ng/mL Cannabinoid, urine                 Cutoff 50 ng/mL Barbiturates + metabolites, urine  Cutoff 200 ng/mL Benzodiazepine, urine              Cutoff 200 ng/mL Methadone, urine  Cutoff 300 ng/mL  The urine drug screen provides only a preliminary, unconfirmed analytical test result and should not be used for non-medical purposes. Clinical consideration and professional judgment should be applied to any positive drug screen result due to possible interfering substances. A more specific alternate chemical method must be used in order to obtain a confirmed analytical result. Gas chromatography / mass spectrometry (GC/MS) is the preferred confirm atory method. Performed at [facility], [address] [place], [place] [address]   Glucose, capillary     Status: None   Collection Time: 06/28/20 10:14 AM  Result Value Ref Range   Glucose-Capillary 82 70 - 99 mg/dL    Comment: Glucose reference range applies only to samples taken after fasting for at least 8 hours.  Glucose, capillary     Status: None   Collection Time: 06/28/20 12:48 PM  Result Value Ref Range   Glucose-Capillary 80 70 - 99 mg/dL    Comment: Glucose reference range applies only to samples taken after fasting for at least 8 hours.  Glucose, capillary      Status: Abnormal   Collection Time: 06/28/20  4:29 PM  Result Value Ref Range   Glucose-Capillary 111 (H) 70 - 99 mg/dL    Comment: Glucose reference range applies only to samples taken after fasting for at least 8 hours.  Glucose, capillary     Status: None   Collection Time: 06/28/20  8:46 PM  Result Value Ref Range   Glucose-Capillary 77 70 - 99 mg/dL    Comment: Glucose reference range applies only to samples taken after fasting for at least 8 hours.  Glucose, capillary     Status: None   Collection Time: 06/29/20 12:42 AM  Result Value Ref Range   Glucose-Capillary 74 70 - 99 mg/dL    Comment: Glucose reference range applies only to samples taken after fasting for at least 8 hours.   Comment 1 Notify RN   Glucose, capillary     Status: None   Collection Time: 06/29/20  4:51 AM  Result Value Ref Range   Glucose-Capillary 80 70 - 99 mg/dL    Comment: Glucose reference range applies only to samples taken after fasting for at least 8 hours.  Glucose, capillary     Status: None   Collection Time: 06/29/20  9:16 AM  Result Value Ref Range   Glucose-Capillary 89 70 - 99 mg/dL    Comment: Glucose reference range applies only to samples taken after fasting for at least 8 hours.  CBC     Status: Abnormal   Collection Time: 06/30/20  6:10 AM  Result Value Ref Range   WBC 6.3 4.0 - 10.5 K/uL   RBC 3.53 (L) 3.87 - 5.11 MIL/uL   Hemoglobin 10.4 (L) 12.0 - 15.0 g/dL   HCT 85.0 (L) 27.7 - 41.2 %   MCV 84.7 80.0 - 100.0 fL   MCH 29.5 26.0 - 34.0 pg   MCHC 34.8 30.0 - 36.0 g/dL   RDW 87.8 67.6 - 72.0 %   Platelets 122 (L) 150 - 400 K/uL   nRBC 0.0 0.0 - 0.2 %    Comment: Performed at [facility], [address] [place], [place] [address]    There is no immunization history on file for this patient.  Assessment:   30 y.o. [id number] postpartum day # 1 TSVD planning on postpartum BTL later today  Plan:    1) Acute blood loss anemia - hemodynamically stable and  asymptomatic - po ferrous sulfate  2) Blood Type --/--/O POS (04/06 9675) / Rubella 1.28 (12/22 2132) / Varicella Immune  3) TDAP status administer prior to discharge  4) Feeding plan breast  5)  Education given regarding options for contraception, as well as compatibility with breast feeding if applicable.  Patient plans on tubal ligation for contraception.  30 y.o. [id number]  with undesired fertility, desires permanent sterilization.  Other reversible forms of contraception were discussed with patient; she declines all other modalities. Permanent nature of as well as associated risks of the procedure discussed with patient including but not limited to: risk of regret, permanence of method, bleeding, infection, injury to surrounding organs and need for additional procedures.  Failure risk of 0.5-1% with increased risk of ectopic gestation if pregnancy occurs was also discussed with patient.    6) Disposition anticipated discharge tomorrow POD1  Vena Austria, MD, [name], [facility] 06/30/2020, 7:54 AM

## 2020-06-30 NOTE — Anesthesia Preprocedure Evaluation (Signed)
Anesthesia Evaluation  Patient identified by MRN, date of birth, ID band Patient awake    Reviewed: Allergy & Precautions, H&P , NPO status , Patient's Chart, lab work & pertinent test results  History of Anesthesia Complications Negative for: history of anesthetic complications  Airway Mallampati: III     Mouth opening: Limited Mouth Opening  Dental no notable dental hx. (+) Teeth Intact   Pulmonary neg pulmonary ROS, neg sleep apnea, neg COPD, Patient abstained from smoking.Not current smoker, former smoker,    breath sounds clear to auscultation       Cardiovascular Exercise Tolerance: Good METS(-) hypertension(-) CAD and (-) Past MI negative cardio ROS  (-) dysrhythmias  Rhythm:Regular Rate:Normal     Neuro/Psych  Headaches, PSYCHIATRIC DISORDERS Anxiety Depression    GI/Hepatic neg GERD  ,(+)     (-) substance abuse  ,   Endo/Other  diabetes, Gestational  Renal/GU negative Renal ROS     Musculoskeletal   Abdominal   Peds  Hematology  (+) Blood dyscrasia, anemia ,   Anesthesia Other Findings Past Medical History: No date: Anemia No date: Family history of ovarian cancer     Comment:  3/22 cancer genetic testing letter sent No date: History of gonorrhea     Comment:  treated 07/2019 at [facility]. Health Dept. No date: History of urinary infection No date: Irritable bowel syndrome No date: Migraine  Reproductive/Obstetrics                             Anesthesia Physical  Anesthesia Plan  ASA: II  Anesthesia Plan: General   Post-op Pain Management:    Induction: Intravenous  PONV Risk Score and Plan: 4 or greater and Ondansetron, Dexamethasone and Midazolam  Airway Management Planned: Oral ETT  Additional Equipment: None  Intra-op Plan:   Post-operative Plan: Extubation in OR  Informed Consent: I have reviewed the patients History and Physical, chart, labs and  discussed the procedure including the risks, benefits and alternatives for the proposed anesthesia with the patient or authorized representative who has indicated his/her understanding and acceptance.     Dental advisory given  Plan Discussed with: Anesthesiologist  Anesthesia Plan Comments: (Offered patient the option of spinal vs GETA, and r/b/a. Patient elected for general anesthesia. Discussed risks of anesthesia with patient, including PONV, sore throat, lip/dental damage. Rare risks discussed as well, such as cardiorespiratory and neurological sequelae. Patient understands.)        Anesthesia Quick Evaluation

## 2020-06-30 NOTE — Anesthesia Postprocedure Evaluation (Signed)
Anesthesia Post Note  Patient: Kylie [name]  Procedure(s) Performed: AN AD HOC LABOR EPIDURAL  Patient location during evaluation: Mother Baby Anesthesia Type: Epidural Level of consciousness: awake and alert Pain management: pain level controlled Vital Signs Assessment: post-procedure vital signs reviewed and stable Respiratory status: spontaneous breathing, nonlabored ventilation and respiratory function stable Cardiovascular status: stable Postop Assessment: no headache, no backache and epidural receding Anesthetic complications: no   No complications documented.   Last Vitals:  Vitals:   06/30/20 0308 06/30/20 0732  BP: 116/69 134/82  Pulse: 79 76  Resp: 18 20  Temp: (!) 36.4 C 36.6 C  SpO2: 98% 99%    Last Pain:  Vitals:   06/30/20 0732  TempSrc: Oral  PainSc:                  Jeanine Luz

## 2020-06-30 NOTE — Plan of Care (Signed)
Pt NPO for BTL today; taking tylenol and motrin for pain control; breastfeeding and did require some assistance this shift; pt's has SL that flushes well; consents for surgery in pt's chart

## 2020-06-30 NOTE — Progress Notes (Signed)
Patient returned from PACU in stable condition.

## 2020-06-30 NOTE — Anesthesia Procedure Notes (Signed)
Procedure Name: Intubation Date/Time: 06/30/2020 12:44 PM Performed by: Justus Memory, CRNA Pre-anesthesia Checklist: Patient identified, Patient being monitored, Timeout performed, Emergency Drugs available and Suction available Patient Re-evaluated:Patient Re-evaluated prior to induction Oxygen Delivery Method: Circle system utilized Preoxygenation: Pre-oxygenation with 100% oxygen Induction Type: IV induction Ventilation: Mask ventilation without difficulty Laryngoscope Size: Mac, 3, McGraph and Glidescope Grade View: Grade I Tube type: Oral Tube size: 7.0 mm Number of attempts: 1 Airway Equipment and Method: Stylet Placement Confirmation: ETT inserted through vocal cords under direct vision,  positive ETCO2 and breath sounds checked- equal and bilateral Secured at: 21 cm Tube secured with: Tape Dental Injury: Teeth and Oropharynx as per pre-operative assessment  Difficulty Due To: Difficulty was anticipated, Difficult Airway- due to anterior larynx, Difficult Airway- due to limited oral opening and Difficult Airway- due to large tongue Future Recommendations: Recommend- induction with short-acting agent, and alternative techniques readily available

## 2020-06-30 NOTE — H&P (Signed)
Please see the History and Physical update from 06/28/2020 at 9:25 AM.  Mirna Mires, CNM  06/30/2020 5:53 PM

## 2020-06-30 NOTE — Lactation Note (Signed)
This note was copied from a baby's chart. Lactation Consultation Note  Patient Name: Boy Kylie Simmonds [id number] Date: 06/30/2020 Reason for consult: Follow-up assessment;Term Age:28 hours  Maternal Data Has patient been taught Hand Expression?: Yes Does the patient have breastfeeding experience prior to this delivery?: Yes How long did the patient breastfeed?: attempted latching with last child, would not latch and did not continue Mom tested pos cannaboid on 4/6, baby tested negative per urine, counselled about discontinuing  use of marijuana while breastfeeding by D Apple RN. Feeding Mother's Current Feeding Choice: Breast Milk Baby was fed 30 cc formula at last feeding at 1400, baby sleepy since, awakened to attempt, mom has been using nipple shield because of pain yesterday with latch and flat nipples,reviewed with how to apply nipple shield and given Medela nipple shield handout, shown how to support breast to maintain attachment of shield, baby pulls in chin and is not opening mouth and rooting, LC yesterday observed a tight frenulum, I could not assess this as baby not opening mouth enough to assess, baby in cradle hold at right breast baby did open mouth to latch to shield, but pushes shield out of mouth with tongue after sucking approx 2 times, then started gagging, still very sleepy, gassy and beginning to have a BM, mom desires to hold baby and try again after baby finishes having bowel movt.  She states she will call for assist as needed.     LATCH Score Latch: Repeated attempts needed to sustain latch, nipple held in mouth throughout feeding, stimulation needed to elicit sucking reflex.  Audible Swallowing: None  Type of Nipple: Flat  Comfort (Breast/Nipple): Soft / non-tender (tender when attempts without shield)  Hold (Positioning): Assistance needed to correctly position infant at breast and maintain latch.  LATCH Score: 5   Lactation Tools Discussed/Used Tools: Nipple  Shields Nipple shield size: 20  Interventions Interventions: Breast feeding basics reviewed;Assisted with latch;Hand express;Support pillows  Discharge Pump:  (plans on getting prescription to obtain through medicaid) WIC Program: No  Consult Status Consult Status: Follow-up Date: 07/01/20 Follow-up type: In-patient    Dyann Kief 06/30/2020, 6:24 PM

## 2020-06-30 NOTE — Anesthesia Postprocedure Evaluation (Signed)
Anesthesia Post Note  Patient: Kylie [name]  Procedure(s) Performed: POST PARTUM TUBAL LIGATION (N/A )  Patient location during evaluation: PACU Anesthesia Type: General Level of consciousness: awake and alert and oriented Pain management: pain level controlled Vital Signs Assessment: post-procedure vital signs reviewed and stable Respiratory status: spontaneous breathing, nonlabored ventilation and respiratory function stable Cardiovascular status: blood pressure returned to baseline and stable Postop Assessment: no signs of nausea or vomiting Anesthetic complications: no   No complications documented.   Last Vitals:  Vitals:   06/30/20 1400 06/30/20 1415  BP: 130/88 132/84  Pulse: 86 82  Resp: 14 14  Temp:    SpO2: 97% 97%    Last Pain:  Vitals:   06/30/20 1345  TempSrc:   PainSc: 6                  Hatice Bubel

## 2020-06-30 NOTE — Transfer of Care (Signed)
Immediate Anesthesia Transfer of Care Note  Patient: Kylie [name]  Procedure(s) Performed: POST PARTUM TUBAL LIGATION (N/A )  Patient Location: PACU  Anesthesia Type:General  Level of Consciousness: sedated  Airway & Oxygen Therapy: Patient connected to face mask oxygen  Post-op Assessment: Report given to RN and Post -op Vital signs reviewed and stable  Post vital signs: Reviewed and stable  Last Vitals:  Vitals Value Taken Time  BP 121/82 06/30/20 1333  Temp    Pulse 82 06/30/20 1334  Resp 14 06/30/20 1334  SpO2 100 % 06/30/20 1334  Vitals shown include unvalidated device data.  Last Pain:  Vitals:   06/30/20 1037  TempSrc: Temporal  PainSc:       Patients Stated Pain Goal: 0 (06/30/20 1037)  Complications: No complications documented.

## 2020-06-30 NOTE — Op Note (Signed)
Preoperative Diagnosis: 1) 30 y.o. [id number] with undesired fertility  Postoperative Diagnosis: 1) 30 y.o. [id number] with undesired fertility  Operation Performed: Postpartum bilateral tubal ligation via Pomeroy method  Indication: 30 y.o. [id number]  with undesired fertility, desires permanent sterilization.  Other reversible forms of contraception were discussed with patient; she declines all other modalities. Permanent nature of as well as associated risks of the procedure discussed with patient including but not limited to: risk of regret, permanence of method, bleeding, infection, injury to surrounding organs and need for additional procedures.  Failure risk of 0.5-1% with increased risk of ectopic gestation if pregnancy occurs was also discussed with patient.    Surgeon: Vena Austria, MD  Anesthesia: General  Preoperative Antibiotics: none  Estimated Blood Loss: 5 mL  IV Fluids:  Urine Output:: N/A  Drains or Tubes: none  Implants: none  Specimens Removed: Bilateral segment of fallopian tube  Complications: none  Intraoperative Findings: Normal tubes, ovaries, and uterine fundus.  Good 1cm portion of tube excised from each fallopian tube with tubal ostia visualized.  Patient Condition: stable  Procedure in Detail:  Patient was taken to the operating room where she was administered general anesthesia.  She was positioned in the supine position, prepped and draped in the usual sterile fashion.  Prior to proceeding with procedure a time out was performed.  Attention was turned to the patient's abdomen.  The umbilicus was infiltrated with 1% Sensorcaine, before making a 3cm vertical infraumbilical stab incision using an 11 blade scalpel.  The subcutaneous tissue was dissected of the rectus fascia using a hemostat.  The fascia was then grasped with the hemostat, tented up, regrasped with second hemostat before releasing and re-grasping with the first hemostat to verify no bowl  was grasped.  The fascia was incised using a mayo scissors.  The fascial edges were taged with 0 Vicryl stay sutures prior to removing them.  Army-[employer] retractors were placed the peritoneum was identified, grasped with a hemostat, incised using metzenbaum scissors after verifying that the tissue was translucent by placing the scissors behind the tissue to verify it was free of omentum or bowl.  The operators fingers was placed through the incision and noted it to be free of adhesive disease or umbilical hernias.  The table was put into left tilt, a small moist lap pad tagged with a hemostat was then placed through in incision using Singley forceps to pack away and bowl or omentum.  The right fallopian tube was identified and walked out to its fimbriated end using the Singley forceps and a Babcock clamps.  A mid isthmic portion of tube was then suture ligated twice using a 0 chromic wheel.  The intervening knuckle of tube was then excised.  The tube was noted to be hemostatic before returning it to the abdomen.  The mini-lap was removed and the patient table was tilted to the right with the same procedure being repeated for the patient's left tube.    The previously placed stay sutures were tied together, no fascial defects were noted after closure.  The skin was closed using a 4-0 Vciryll in a subcuticular fashion.  The incision was then dressed with surgical skin glue.  Sponge needle and instrument counts were correct time two.  The patient tolerated the procedure well and was taken to the recovery room in stable condition.

## 2020-07-01 ENCOUNTER — Encounter: Payer: Self-pay | Admitting: Obstetrics and Gynecology

## 2020-07-01 DIAGNOSIS — O24419 Gestational diabetes mellitus in pregnancy, unspecified control: Secondary | ICD-10-CM | POA: Diagnosis present

## 2020-07-01 LAB — URINALYSIS, COMPLETE (UACMP) WITH MICROSCOPIC
Bilirubin Urine: NEGATIVE
Glucose, UA: NEGATIVE mg/dL
Ketones, ur: NEGATIVE mg/dL
Nitrite: NEGATIVE
Protein, ur: 30 mg/dL — AB
RBC / HPF: >50 RBC/hpf — ABNORMAL HIGH (ref 0–5)
Specific Gravity, Urine: 1.027 (ref 1.005–1.030)
WBC, UA: >50 WBC/hpf — ABNORMAL HIGH (ref 0–5)
pH: 5 (ref 5.0–8.0)

## 2020-07-01 MED ORDER — IBUPROFEN 600 MG PO TABS: 600.0000 mg | ORAL_TABLET | Freq: Four times a day (QID) | ORAL | 0 refills | Status: DC

## 2020-07-01 MED ORDER — ACETAMINOPHEN 10 MG/ML IV SOLN
INTRAVENOUS | Status: AC
  Filled 2020-07-01: qty 100

## 2020-07-01 MED ORDER — ACETAMINOPHEN 500 MG PO TABS: 1000.0000 mg | ORAL_TABLET | Freq: Three times a day (TID) | ORAL | Status: DC | PRN

## 2020-07-01 MED ORDER — OXYCODONE-ACETAMINOPHEN 5-325 MG PO TABS: 1.0000 | ORAL_TABLET | Freq: Four times a day (QID) | ORAL | 0 refills | Status: DC | PRN

## 2020-07-01 MED ORDER — FENTANYL CITRATE (PF) 100 MCG/2ML IJ SOLN
INTRAMUSCULAR | Status: AC
  Filled 2020-07-01: qty 2

## 2020-07-01 NOTE — Lactation Note (Signed)
This note was copied from a baby's chart. Lactation Consultation Note  Patient Name: Kylie [name] [id number] Date: 07/01/2020 Reason for consult: Follow-up assessment;Term Age:28 hours  Follow up with 50 hours old infant with 4.77% weight loss at the time of this visit. Mother reports breastfeeding is going well. Mother explains she has been using the nipple shield. Infant has good output.  Reviewed normal newborn behavior and cluster feeding. Reviewed Lactation Services brochure and encouraged to call for support. Praised mother for her efforts and dedication.   Discharge Plan: 1. Breastfeed following hunger cues or 8 - 12 times in 24h period using the nipple shield as needed.   2. Keep infant awake at breast: massage shoulder, back, feet or forehead.. 3. Pump or hand-express and offer EBM prior to formula supplementation. 4. If needed supplement with formula following guidelines, paced bottle feeding and fullness cues.   5. Encouraged maternal rest, hydration and food intake.  6. Contact Lactation Services or local resources for support, questions or concerns.    All questions answered at this time. Promoted [employer] booklet for FAQs. Maternal Data Has patient been taught Hand Expression?: Yes Does the patient have breastfeeding experience prior to this delivery?: Yes  Feeding Mother's Current Feeding Choice: Breast Milk  Interventions Interventions: Breast feeding basics reviewed;Skin to skin;Hand express;Breast massage;Expressed milk;Hand pump;Education  Discharge Discharge Education: Engorgement and breast care;Warning signs for feeding baby Pump: Manual WIC Program: No (plans to apply)  Consult Status Consult Status: Complete Date: 07/01/20 Follow-up type: Call as needed    Kylie [name] 07/01/2020, 1:41 PM

## 2020-07-01 NOTE — Progress Notes (Signed)
Mother discharged.  Discharge instructions given.  Mother verbalizes understanding.  Transported by auxiliary.  

## 2020-07-02 ENCOUNTER — Other Ambulatory Visit: Payer: Self-pay | Admitting: Obstetrics and Gynecology

## 2020-07-02 DIAGNOSIS — N3001 Acute cystitis with hematuria: Secondary | ICD-10-CM

## 2020-07-02 MED ORDER — CEPHALEXIN 500 MG PO TABS: 500.0000 mg | ORAL_TABLET | Freq: Four times a day (QID) | ORAL | 0 refills | Status: AC

## 2020-07-03 ENCOUNTER — Telehealth: Payer: Self-pay

## 2020-07-03 LAB — SURGICAL PATHOLOGY

## 2020-07-03 NOTE — Telephone Encounter (Signed)
Transition Care Management Unsuccessful Follow-up Telephone Call  Date of discharge and from where:  07/01/2020 from [facility]  Attempts:  1st Attempt  Reason for unsuccessful TCM follow-up call:  Left voice message

## 2020-07-04 NOTE — Telephone Encounter (Signed)
Transition Care Management Follow-up Telephone Call  Date of discharge and from where: 07/01/2020 from [facility]  How have you been since you were released from the hospital? Pt states that her and baby are doing well and has no questions or concerns right now.   Any questions or concerns? No  Items Reviewed:  Did the pt receive and understand the discharge instructions provided? Yes   Medications obtained and verified? Yes   Other? No   Any new allergies since your discharge? No   Dietary orders reviewed? n/a  Do you have support at home? Yes   Functional Questionnaire: (I = Independent and D = Dependent) ADLs: I  Bathing/Dressing- I  Meal Prep- I  Eating- I  Maintaining continence- I  Transferring/Ambulation- I  Managing Meds- I   Follow up appointments reviewed:   PCP Hospital f/u appt confirmed? No    Specialist Hospital f/u appt confirmed? Yes  Pt is calling OBGYN to schedule follow up appt.   Are transportation arrangements needed? No   If their condition worsens, is the pt aware to call PCP or go to the Emergency Dept.? Yes  Was the patient provided with contact information for the PCP's office or ED? Yes  Was to pt encouraged to call back with questions or concerns? Yes

## 2020-07-09 NOTE — H&P (Signed)
  UPDATED HISTORY AND PHYSICAL-   See updated History and Physical update previously CHARTED ON 06/28/2020 AT 0925.  Mirna Mires, CNM  07/09/2020 9:04 AM

## 2020-08-11 ENCOUNTER — Ambulatory Visit (INDEPENDENT_AMBULATORY_CARE_PROVIDER_SITE_OTHER): Payer: Medicaid Other | Admitting: Obstetrics & Gynecology

## 2020-08-11 ENCOUNTER — Other Ambulatory Visit: Payer: Self-pay

## 2020-08-11 ENCOUNTER — Encounter: Payer: Self-pay | Admitting: Obstetrics & Gynecology

## 2020-08-11 ENCOUNTER — Other Ambulatory Visit (HOSPITAL_COMMUNITY)
Admission: RE | Admit: 2020-08-11 | Discharge: 2020-08-11 | Disposition: A | Discharge status: Home / Self Care | Payer: Medicaid Other | Source: Ambulatory Visit | Attending: Obstetrics & Gynecology | Admitting: Obstetrics & Gynecology

## 2020-08-11 VITALS — BP 120/80 | Ht 70.0 in | Wt 190.0 lb

## 2020-08-11 DIAGNOSIS — Z124 Encounter for screening for malignant neoplasm of cervix: Secondary | ICD-10-CM | POA: Insufficient documentation

## 2020-08-11 NOTE — Progress Notes (Signed)
  OBSTETRICS POSTPARTUM CLINIC PROGRESS NOTE  Subjective:     Kylie [name] is a 30 y.o. G35P3003 female who presents for a postpartum visit. She is 6 weeks postpartum following a Term pregnancy and delivery by Vaginal, no problems at delivery.  Shoulder dystocia easily resolved.  S/p PP BTL.  I have fully reviewed the prenatal and intrapartum course. Anesthesia: epidural.  Postpartum course has been complicated by uncomplicated.  Baby is feeding by Breast.  Bleeding: patient has not  resumed menses.  Bowel function is normal. Bladder function is normal.  Patient is not sexually active. Contraception method desired is tubal ligation.  Postpartum depression screening: negative. Edinburgh 2.  The following portions of the patient's history were reviewed and updated as appropriate: allergies, current medications, past family history, past medical history, past social history, past surgical history and problem list.  Review of Systems Pertinent items are noted in HPI.  Objective:    BP 120/80   Ht 5\' 10"  (1.778 m)   Wt 190 lb (86.2 kg)   BMI 27.26 kg/m   General:  alert and no distress   Breasts:  inspection negative, no nipple discharge or bleeding, no masses or nodularity palpable  Lungs: clear to auscultation bilaterally  Heart:  regular rate and rhythm, S1, S2 normal, no murmur, click, rub or gallop  Abdomen: soft, non-tender; bowel sounds normal; no masses,  no organomegaly.  Well healed tubal incision   Vulva:  normal  Vagina: normal vagina, no discharge, exudate, lesion, or erythema  Cervix:  no cervical motion tenderness and no lesions  Corpus: normal size, contour, position, consistency, mobility, non-tender  Adnexa:  normal adnexa and no mass, fullness, tenderness  Rectal Exam: Not performed.          Assessment:  Post Partum Care visit 1. Screening for cervical cancer - Cytology - PAP  2. Postpartum care and examination  Plan:  See orders and Patient  Instructions Resume all normal activities Follow up in: 11 months or as needed.   , MD, Annamarie Major Ob/Gyn, [facility] 08/11/2020  11:22 AM

## 2020-08-14 LAB — CYTOLOGY - PAP
Chlamydia: NEGATIVE
Comment: NEGATIVE
Comment: NORMAL
Diagnosis: NEGATIVE
Neisseria Gonorrhea: NEGATIVE

## 2020-08-16 NOTE — Addendum Note (Signed)
Addended by: Heywood Bene on: 08/16/2020 02:49 PM   Modules accepted: Orders

## 2020-12-20 DIAGNOSIS — Z13220 Encounter for screening for lipoid disorders: Secondary | ICD-10-CM | POA: Diagnosis not present

## 2020-12-20 DIAGNOSIS — Z131 Encounter for screening for diabetes mellitus: Secondary | ICD-10-CM | POA: Diagnosis not present

## 2020-12-20 DIAGNOSIS — Z130 Encounter for screening for diseases of the blood and blood-forming organs and certain disorders involving the immune mechanism: Secondary | ICD-10-CM | POA: Diagnosis not present

## 2020-12-20 DIAGNOSIS — Z0000 Encounter for general adult medical examination without abnormal findings: Secondary | ICD-10-CM | POA: Diagnosis not present

## 2021-01-03 DIAGNOSIS — Z130 Encounter for screening for diseases of the blood and blood-forming organs and certain disorders involving the immune mechanism: Secondary | ICD-10-CM | POA: Diagnosis not present

## 2021-01-03 DIAGNOSIS — Z131 Encounter for screening for diabetes mellitus: Secondary | ICD-10-CM | POA: Diagnosis not present

## 2022-07-01 IMAGING — US US OB COMP +14 WK
1 series · 13 of 28 positions shown · non-contrast
Comparison: none

CLINICAL DATA: Back pain, no prior prenatal care

EXAM:
OBSTETRICAL ULTRASOUND >14 WKS

[Series 1: ob us · 13 of 106 slices shown]
[im 4/106]
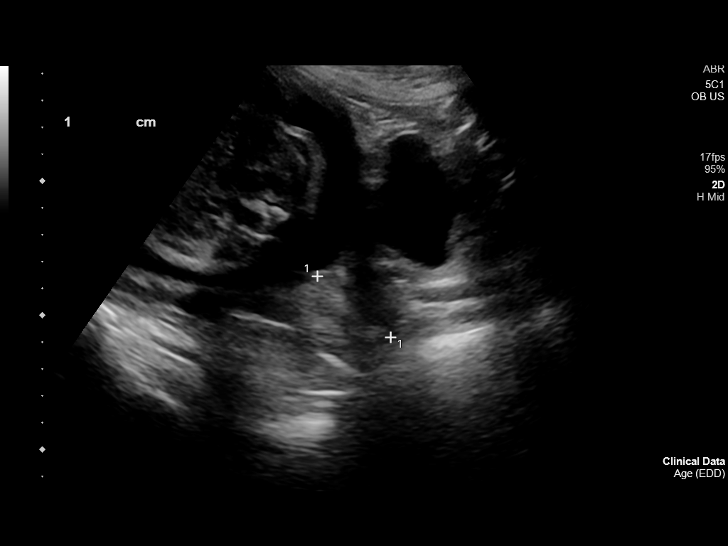
[im 12/106]
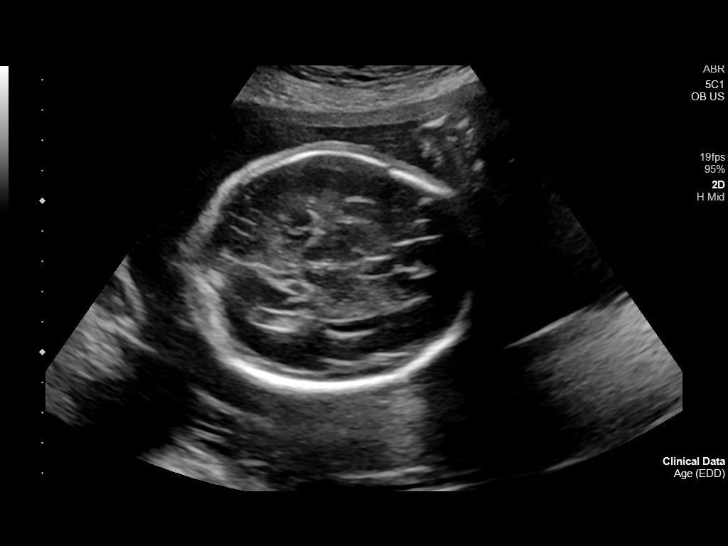
[im 20/106]
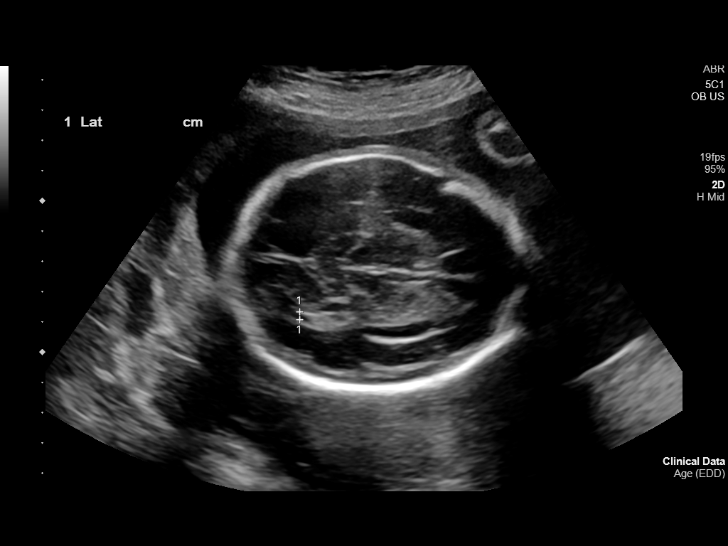
[im 28/106]
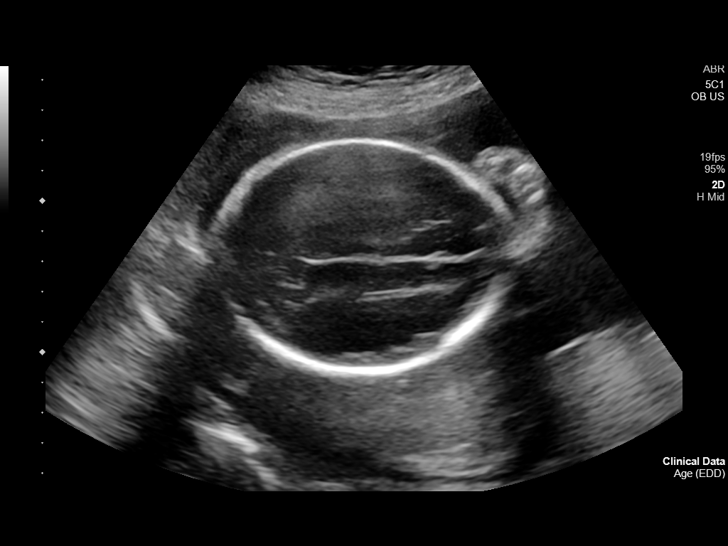
[im 36/106]
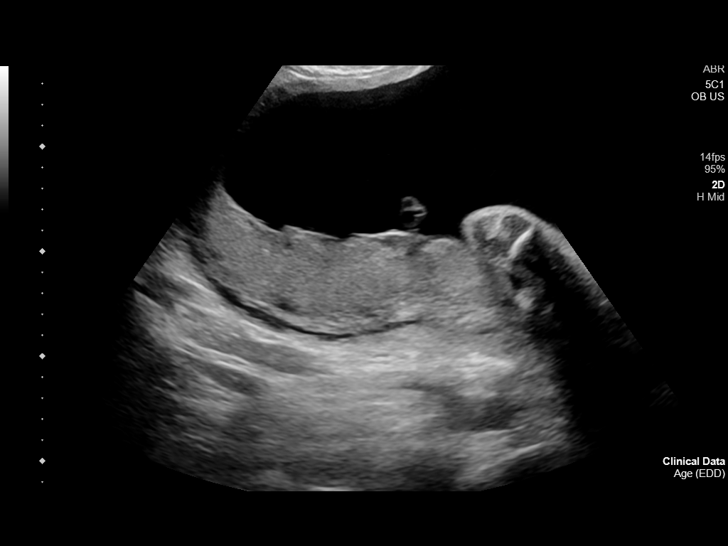
[im 43/106]
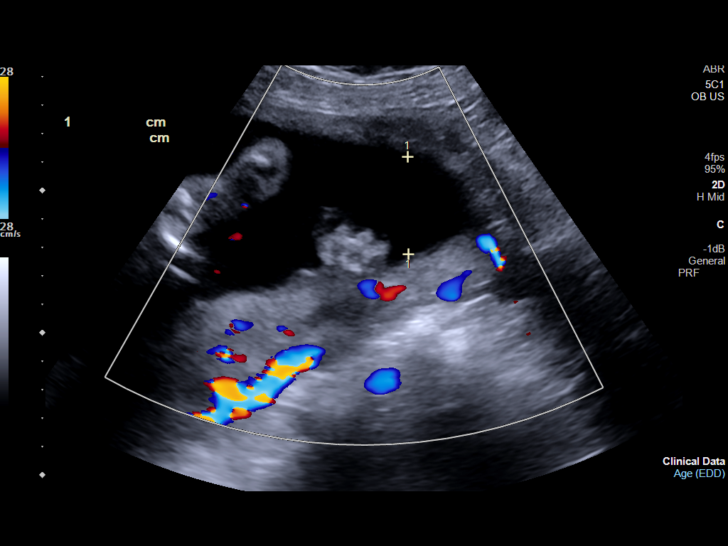
[im 55/106]
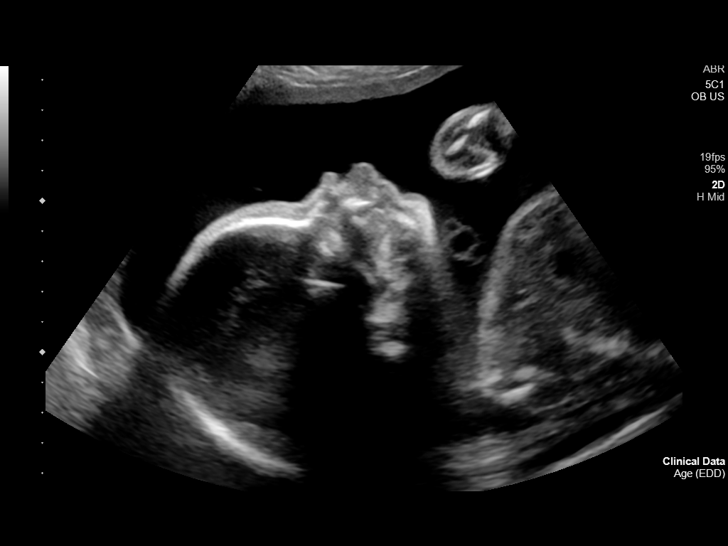
[im 63/106]
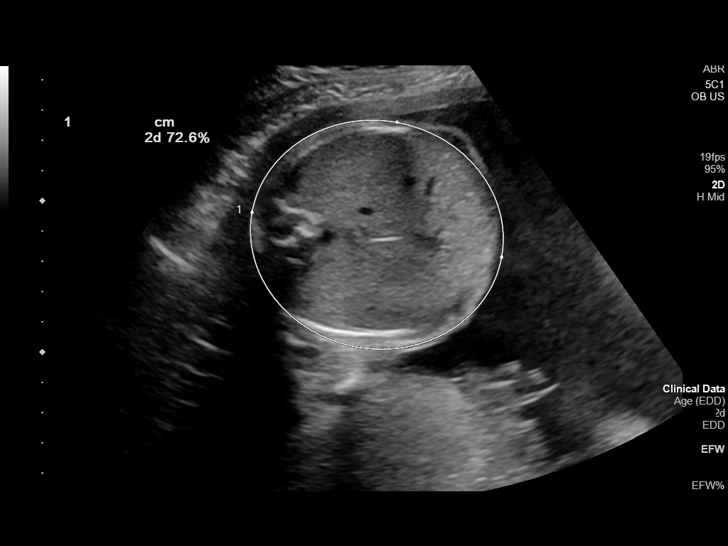
[im 71/106]
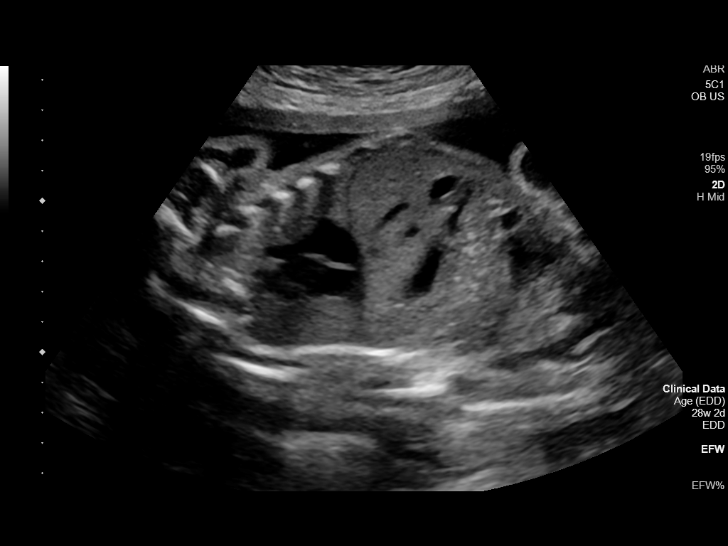
[im 78/106]
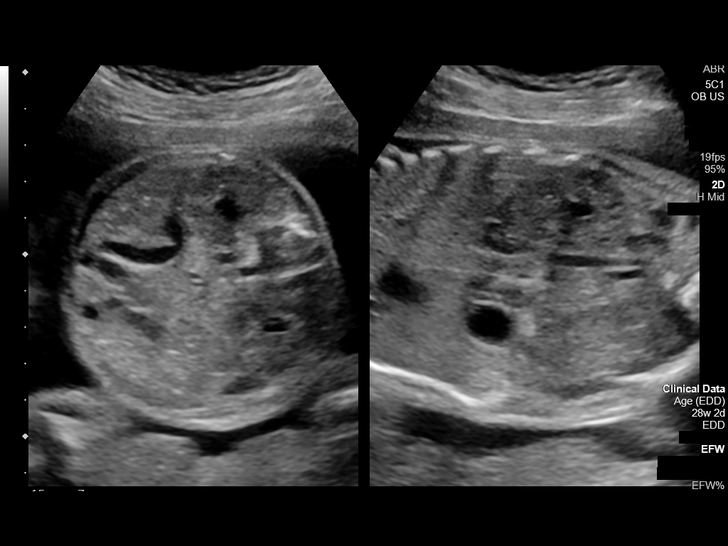
[im 86/106]
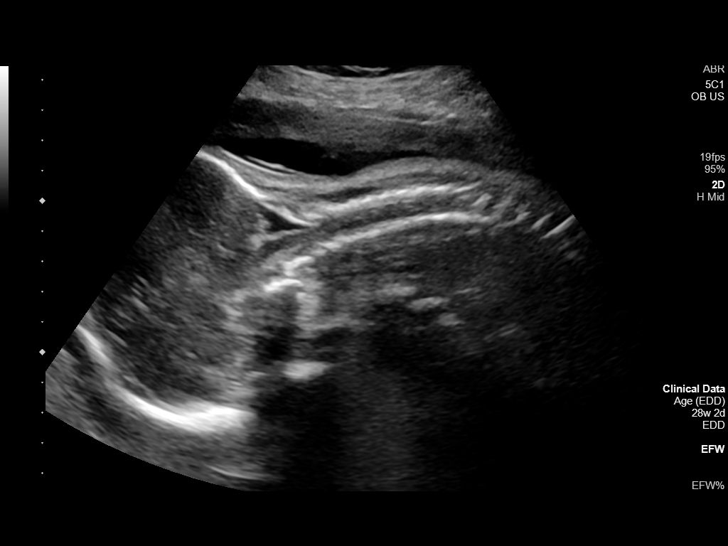
[im 94/106]
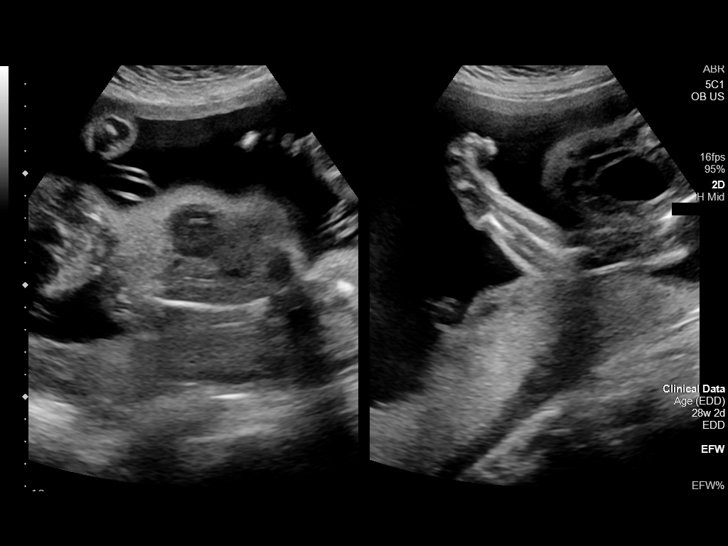
[im 102/106]
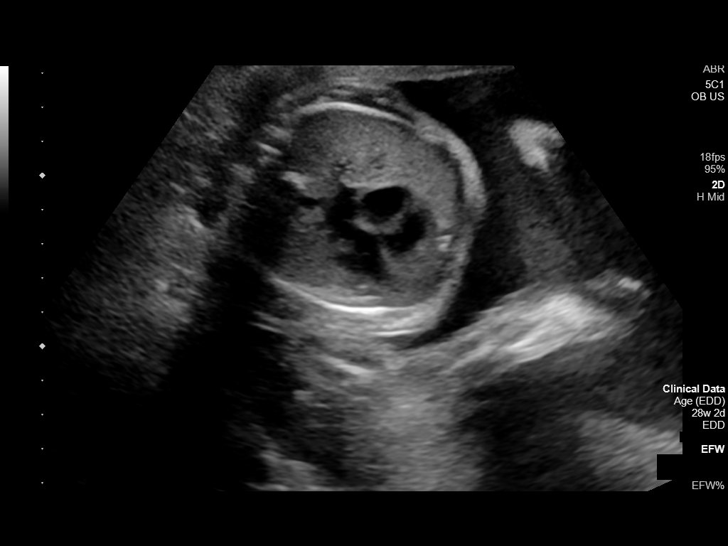

[13 of 28 positions shown; findings below may reference images not displayed]

FINDINGS: Number of Fetuses: 1

Heart Rate:  157 bpm

Movement: Yes

Presentation: Transverse

Previa: No

Placental Location: Posterior

Amniotic Fluid (Subjective): There are normal

Amniotic Fluid (Objective):

AFI = 19.3 cm (5%ile= 9.4 cm, 95%= 22.8 cm for 28 wks)

FETAL BIOMETRY

BPD: 7.6cm 30w 4d

HC:   28.6cm 31w 3d

AC:   25.1cm 29w 2d

FL:   5.25cm 28w 0d

Current Mean GA: 29w 4d                     US EDC: 06/26/2020

Assigned GA:  28w 2d            Assigned EDC: 07/05/2020

Estimated Fetal Weight: 1343.5g 70.6%ile

FETAL ANATOMY

Lateral Ventricles: Appears normal

Thalami/CSP: Appears normal

Posterior Fossa:  Not visualized

Nuchal Region: Not visualized   NFT= N/A > 20 WKS

Upper Lip: Appears normal

Spine: Not visualized

4 Chamber Heart on Left: Appears normal

LVOT: Previously seen

RVOT: Not well visualized

Stomach on Left: Appears normal

3 Vessel Cord: Appears normal

Cord Insertion site: Not visualized

Kidneys: Prominent right renal pelvis measuring 4 mm. Mild left
pelviectasis measuring 6 mm.

Bladder: Appears normal

Extremities: Appears normal

Sex: Male

Technically difficult due to: None

Maternal Findings:

Cervix:  3.6 cm in length.  Closed.
IMPRESSION: 1. Single live intrauterine pregnancy.
2. Prominent right renal pelvis measuring 4 mm. Mild left
pelviectasis measuring 6 mm. Recommend attention on follow-up
examination.

## 2023-04-12 ENCOUNTER — Other Ambulatory Visit: Payer: Self-pay

## 2023-04-12 ENCOUNTER — Encounter: Payer: Self-pay | Admitting: Intensive Care

## 2023-04-12 ENCOUNTER — Emergency Department: Payer: Medicaid Other

## 2023-04-12 ENCOUNTER — Emergency Department
Admission: EM | Admit: 2023-04-12 | Discharge: 2023-04-12 | Disposition: A | Discharge status: Home / Self Care | Payer: Medicaid Other | Attending: Student in an Organized Health Care Education/Training Program | Admitting: Student in an Organized Health Care Education/Training Program

## 2023-04-12 DIAGNOSIS — R112 Nausea with vomiting, unspecified: Secondary | ICD-10-CM | POA: Insufficient documentation

## 2023-04-12 DIAGNOSIS — F419 Anxiety disorder, unspecified: Secondary | ICD-10-CM | POA: Insufficient documentation

## 2023-04-12 DIAGNOSIS — R1084 Generalized abdominal pain: Secondary | ICD-10-CM | POA: Diagnosis not present

## 2023-04-12 DIAGNOSIS — R197 Diarrhea, unspecified: Secondary | ICD-10-CM | POA: Insufficient documentation

## 2023-04-12 LAB — COMPREHENSIVE METABOLIC PANEL
ALT: 23 U/L (ref 0–44)
AST: 23 U/L (ref 15–41)
Albumin: 4.3 g/dL (ref 3.5–5.0)
Alkaline Phosphatase: 69 U/L (ref 38–126)
Anion gap: 12 (ref 5–15)
BUN: 16 mg/dL (ref 6–20)
CO2: 20 mmol/L — ABNORMAL LOW (ref 22–32)
Calcium: 8.5 mg/dL — ABNORMAL LOW (ref 8.9–10.3)
Chloride: 106 mmol/L (ref 98–111)
Creatinine, Ser: 0.62 mg/dL (ref 0.44–1.00)
GFR, Estimated: >60 mL/min (ref >60)
Glucose, Bld: 149 mg/dL — ABNORMAL HIGH (ref 70–99)
Potassium: 3.1 mmol/L — ABNORMAL LOW (ref 3.5–5.1)
Sodium: 138 mmol/L (ref 135–145)
Total Bilirubin: 0.5 mg/dL (ref 0.0–1.2)
Total Protein: 7.9 g/dL (ref 6.5–8.1)

## 2023-04-12 LAB — URINALYSIS, ROUTINE W REFLEX MICROSCOPIC
Bilirubin Urine: NEGATIVE
Glucose, UA: NEGATIVE mg/dL
Hgb urine dipstick: NEGATIVE
Ketones, ur: 20 mg/dL — AB
Leukocytes,Ua: NEGATIVE
Nitrite: NEGATIVE
Protein, ur: 30 mg/dL — AB
Specific Gravity, Urine: 1.027 (ref 1.005–1.030)
pH: 8 (ref 5.0–8.0)

## 2023-04-12 LAB — CBC
HCT: 39.3 % (ref 36.0–46.0)
Hemoglobin: 13.7 g/dL (ref 12.0–15.0)
MCH: 30.2 pg (ref 26.0–34.0)
MCHC: 34.9 g/dL (ref 30.0–36.0)
MCV: 86.6 fL (ref 80.0–100.0)
Platelets: 238 10*3/uL (ref 150–400)
RBC: 4.54 MIL/uL (ref 3.87–5.11)
RDW: 12.6 % (ref 11.5–15.5)
WBC: 10.4 10*3/uL (ref 4.0–10.5)
nRBC: 0 % (ref 0.0–0.2)

## 2023-04-12 LAB — LIPASE, BLOOD: Lipase: 24 U/L (ref 11–51)

## 2023-04-12 LAB — POC URINE PREG, ED: Preg Test, Ur: NEGATIVE

## 2023-04-12 MED ORDER — POTASSIUM CHLORIDE CRYS ER 20 MEQ PO TBCR
40.0000 meq | EXTENDED_RELEASE_TABLET | Freq: Once | ORAL | Status: AC
  Administered 2023-04-12: 40 meq via ORAL
  Filled 2023-04-12: qty 2

## 2023-04-12 MED ORDER — IOHEXOL 300 MG/ML  SOLN
100.0000 mL | Freq: Once | INTRAMUSCULAR | Status: AC | PRN
  Administered 2023-04-12: 100 mL via INTRAVENOUS

## 2023-04-12 MED ORDER — SODIUM CHLORIDE 0.9 % IV BOLUS
1000.0000 mL | Freq: Once | INTRAVENOUS | Status: AC
  Administered 2023-04-12: 1000 mL via INTRAVENOUS

## 2023-04-12 MED ORDER — LORAZEPAM 1 MG PO TABS
1.0000 mg | ORAL_TABLET | Freq: Once | ORAL | Status: AC
  Administered 2023-04-12: 1 mg via ORAL
  Filled 2023-04-12: qty 1

## 2023-04-12 MED ORDER — PROMETHAZINE HCL 12.5 MG PO TABS: 12.5000 mg | ORAL_TABLET | Freq: Four times a day (QID) | ORAL | 0 refills | Status: DC | PRN

## 2023-04-12 MED ORDER — ONDANSETRON HCL 4 MG/2ML IJ SOLN
4.0000 mg | Freq: Once | INTRAMUSCULAR | Status: AC
  Administered 2023-04-12: 4 mg via INTRAVENOUS
  Filled 2023-04-12: qty 2

## 2023-04-12 MED ORDER — SODIUM CHLORIDE 0.9 % IV SOLN
12.5000 mg | Freq: Four times a day (QID) | INTRAVENOUS | Status: DC | PRN
  Administered 2023-04-12: 12.5 mg via INTRAVENOUS
  Filled 2023-04-12: qty 12.5

## 2023-04-12 MED ORDER — POTASSIUM CHLORIDE 10 MEQ/100ML IV SOLN
10.0000 meq | Freq: Once | INTRAVENOUS | Status: AC
  Administered 2023-04-12: 10 meq via INTRAVENOUS
  Filled 2023-04-12: qty 100
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
## 2023-04-12 NOTE — ED Provider Notes (Signed)
[facility] Provider Note    Event Date/Time   First MD Initiated Contact with Patient 04/12/23 [phone]     (approximate)   History   Emesis and Diarrhea   HPI  Kylie [name] is a 33 y.o. female presents the ER for evaluation of nausea vomiting diarrhea that started abruptly last night.  Patient does admit to drinking alcohol last night is otherwise feeling well and woke up early in the morning with intractable vomiting.  Describes crampy diffuse abdominal pain.  She denies any dysuria.  No blood in her stool or vomit.     Physical Exam   Triage Vital Signs: ED Triage Vitals  Encounter Vitals Group     BP 04/12/23 0919 (!) 115/56     Systolic BP Percentile --      Diastolic BP Percentile --      Pulse Rate 04/12/23 0919 81     Resp 04/12/23 0919 18     Temp 04/12/23 0919 97.6 F (36.4 C)     Temp Source 04/12/23 0919 Oral     SpO2 04/12/23 0919 100 %     Weight 04/12/23 0848 200 lb (90.7 kg)     Height 04/12/23 0848 5\' 9"  (1.753 m)     Head Circumference --      Peak Flow --      Pain Score 04/12/23 0831 8     Pain Loc --      Pain Education --      Exclude from Growth Chart --     Most recent vital signs: Vitals:   04/12/23 0919 04/12/23 1506  BP: (!) 115/56   Pulse: 81   Resp: 18   Temp: 97.6 F (36.4 C) 98.7 F (37.1 C)  SpO2: 100%      Constitutional: Alert  Eyes: Conjunctivae are normal.  Head: Atraumatic. Nose: No congestion/rhinnorhea. Mouth/Throat: Mucous membranes are moist.   Neck: Painless ROM.  Cardiovascular:   Good peripheral circulation. Respiratory: Normal respiratory effort.  No retractions.  Gastrointestinal: Soft, no guarding or rebound  Musculoskeletal:  no deformity Neurologic:  MAE spontaneously. No gross focal neurologic deficits are appreciated.  Skin:  Skin is warm, dry and intact. No rash noted. Psychiatric: Mood and affect are normal. Speech and behavior are normal.    ED Results / Procedures /  Treatments   Labs (all labs ordered are listed, but only abnormal results are displayed) Labs Reviewed  COMPREHENSIVE METABOLIC PANEL - Abnormal; Notable for the following components:      Result Value   Potassium 3.1 (*)    CO2 20 (*)    Glucose, Bld 149 (*)    Calcium 8.5 (*)    All other components within normal limits  URINALYSIS, ROUTINE W REFLEX MICROSCOPIC - Abnormal; Notable for the following components:   Color, Urine YELLOW (*)    APPearance HAZY (*)    Ketones, ur 20 (*)    Protein, ur 30 (*)    Bacteria, UA RARE (*)    All other components within normal limits  LIPASE, BLOOD  CBC  POC URINE PREG, ED     EKG     RADIOLOGY    PROCEDURES:  Critical Care performed:   Procedures   MEDICATIONS ORDERED IN ED: Medications  promethazine (PHENERGAN) 12.5 mg in sodium chloride 0.9 % 50 mL IVPB (0 mg Intravenous Stopped 04/12/23 1234)  potassium chloride SA (KLOR-CON M) CR tablet 40 mEq (40 mEq Oral Given 04/12/23 1055)  ondansetron (ZOFRAN) injection 4 mg (4 mg Intravenous Given 04/12/23 1018)  sodium chloride 0.9 % bolus 1,000 mL (0 mLs Intravenous Stopped 04/12/23 1142)  potassium chloride 10 mEq in 100 mL IVPB (0 mEq Intravenous Stopped 04/12/23 1411)  sodium chloride 0.9 % bolus 1,000 mL (1,000 mLs Intravenous New Bag/Given 04/12/23 1507)  LORazepam (ATIVAN) tablet 1 mg (1 mg Oral Given 04/12/23 1410)  iohexol (OMNIPAQUE) 300 MG/ML solution 100 mL (100 mLs Intravenous Contrast Given 04/12/23 1508)     IMPRESSION / MDM / ASSESSMENT AND PLAN / ED COURSE  I reviewed the triage vital signs and the nursing notes.                              Differential diagnosis includes, but is not limited to, enteritis, gastritis, foodborne illness, alcohol effect, norovirus, colitis, diverticulitis, appendicitis, UTI, pregnancy  Patient presenting to the ER for evaluation of symptoms as described above.  Based on symptoms, risk factors and considered above differential, this  presenting complaint could reflect a potentially life-threatening illness therefore the patient will be placed on continuous pulse oximetry and telemetry for monitoring.  Laboratory evaluation will be sent to evaluate for the above complaints.      Clinical Course as of 04/12/23 1552  Sat Apr 12, 2023  1002 Patient is not pregnant.  No leukocytosis.  LFTs normal.  Will give symptomatic treatment.  Does not appear consistent with appendicitis.  Based on presentation do not feel that patient requires CT imaging at this time.  Will give IV fluids and reassess. [PR]  1349 Patient reassessed.  Very anxious appearing hyperventilating.  Will give anxiolysis. [PR]  1443 Patient reassessed not complaining of worsening abdominal pain.  I do think this is likely secondary to cyclic vomiting type pathology but given duration of her symptoms and apparent discomfort will order CT imaging to further evaluate.  Patient will be signed out oncoming physician pending follow-up imaging results. [PR]  1551 CT imaging without evidence of acute abnormality.  Patient reassessed.  She is feeling improved after Ativan.  Repeat abdominal exam soft and benign.  She is tolerating p.o. she is appropriate for discharge. [PR]    Clinical Course User Index [PR] Willy Eddy, MD     FINAL CLINICAL IMPRESSION(S) / ED DIAGNOSES   Final diagnoses:  Nausea and vomiting, unspecified vomiting type  Generalized abdominal pain     Rx / DC Orders   ED Discharge Orders          Ordered    promethazine (PHENERGAN) 12.5 MG tablet  Every 6 hours PRN,   Status:  Discontinued        04/12/23 1551    promethazine (PHENERGAN) 12.5 MG tablet  Every 6 hours PRN        04/12/23 1552             Note:  This document was prepared using Dragon voice recognition software and may include unintentional dictation errors.    Willy Eddy, MD 04/12/23 [phone]

## 2023-05-17 ENCOUNTER — Emergency Department (HOSPITAL_COMMUNITY): Payer: Medicaid Other

## 2023-05-17 ENCOUNTER — Emergency Department (HOSPITAL_COMMUNITY)
Admission: EM | Admit: 2023-05-17 | Discharge: 2023-05-17 | Disposition: A | Discharge status: Home / Self Care | Payer: Medicaid Other | Attending: Emergency Medicine | Admitting: Emergency Medicine

## 2023-05-17 ENCOUNTER — Other Ambulatory Visit: Payer: Self-pay

## 2023-05-17 DIAGNOSIS — Z23 Encounter for immunization: Secondary | ICD-10-CM | POA: Diagnosis not present

## 2023-05-17 DIAGNOSIS — Y92410 Unspecified street and highway as the place of occurrence of the external cause: Secondary | ICD-10-CM | POA: Diagnosis not present

## 2023-05-17 DIAGNOSIS — S93401A Sprain of unspecified ligament of right ankle, initial encounter: Secondary | ICD-10-CM | POA: Diagnosis not present

## 2023-05-17 DIAGNOSIS — S61226A Laceration with foreign body of right little finger without damage to nail, initial encounter: Secondary | ICD-10-CM | POA: Insufficient documentation

## 2023-05-17 DIAGNOSIS — S39012A Strain of muscle, fascia and tendon of lower back, initial encounter: Secondary | ICD-10-CM

## 2023-05-17 DIAGNOSIS — S60946A Unspecified superficial injury of right little finger, initial encounter: Secondary | ICD-10-CM | POA: Diagnosis present

## 2023-05-17 DIAGNOSIS — S61216A Laceration without foreign body of right little finger without damage to nail, initial encounter: Secondary | ICD-10-CM

## 2023-05-17 MED ORDER — LIDOCAINE 5 % EX PTCH
1.0000 | MEDICATED_PATCH | Freq: Once | CUTANEOUS | Status: DC
  Administered 2023-05-17: 1 via TRANSDERMAL
  Filled 2023-05-17: qty 1

## 2023-05-17 MED ORDER — IBUPROFEN 400 MG PO TABS
600.0000 mg | ORAL_TABLET | Freq: Once | ORAL | Status: AC
  Administered 2023-05-17: 600 mg via ORAL
  Filled 2023-05-17: qty 1

## 2023-05-17 MED ORDER — CYCLOBENZAPRINE HCL 10 MG PO TABS: 10.0000 mg | ORAL_TABLET | Freq: Two times a day (BID) | ORAL | 0 refills | Status: DC | PRN

## 2023-05-17 MED ORDER — TETRACAINE HCL 0.5 % OP SOLN
2.0000 [drp] | Freq: Once | OPHTHALMIC | Status: DC
  Filled 2023-05-17: qty 4

## 2023-05-17 MED ORDER — LIDOCAINE 5 % EX PTCH: 1.0000 | MEDICATED_PATCH | CUTANEOUS | 0 refills | Status: DC

## 2023-05-17 MED ORDER — TETANUS-DIPHTH-ACELL PERTUSSIS 5-2.5-18.5 LF-MCG/0.5 IM SUSY
0.5000 mL | PREFILLED_SYRINGE | Freq: Once | INTRAMUSCULAR | Status: AC
  Administered 2023-05-17: 0.5 mL via INTRAMUSCULAR
  Filled 2023-05-17: qty 0.5

## 2023-05-17 MED ORDER — CYCLOBENZAPRINE HCL 10 MG PO TABS
10.0000 mg | ORAL_TABLET | Freq: Once | ORAL | Status: AC
  Administered 2023-05-17: 10 mg via ORAL
  Filled 2023-05-17: qty 1

## 2023-05-17 MED ORDER — ACETAMINOPHEN 500 MG PO TABS
1000.0000 mg | ORAL_TABLET | Freq: Once | ORAL | Status: AC
  Administered 2023-05-17: 1000 mg via ORAL
  Filled 2023-05-17: qty 2

## 2023-05-17 MED ORDER — FLUORESCEIN SODIUM 1 MG OP STRP
1.0000 | ORAL_STRIP | Freq: Once | OPHTHALMIC | Status: DC
  Filled 2023-05-17: qty 1
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
## 2023-12-08 ENCOUNTER — Other Ambulatory Visit: Payer: Self-pay

## 2023-12-08 ENCOUNTER — Emergency Department

## 2023-12-08 DIAGNOSIS — Z87891 Personal history of nicotine dependence: Secondary | ICD-10-CM | POA: Diagnosis not present

## 2023-12-08 DIAGNOSIS — R079 Chest pain, unspecified: Secondary | ICD-10-CM | POA: Insufficient documentation

## 2023-12-08 LAB — CBC
HCT: 37.8 % (ref 36.0–46.0)
Hemoglobin: 13.4 g/dL (ref 12.0–15.0)
MCH: 30.2 pg (ref 26.0–34.0)
MCHC: 35.4 g/dL (ref 30.0–36.0)
MCV: 85.3 fL (ref 80.0–100.0)
Platelets: 243 K/uL (ref 150–400)
RBC: 4.43 MIL/uL (ref 3.87–5.11)
RDW: 12.3 % (ref 11.5–15.5)
WBC: 5.7 K/uL (ref 4.0–10.5)
nRBC: 0 % (ref 0.0–0.2)

## 2023-12-08 LAB — RESP PANEL BY RT-PCR (RSV, FLU A&B, COVID)  RVPGX2
Influenza A by PCR: NEGATIVE
Influenza B by PCR: NEGATIVE
Resp Syncytial Virus by PCR: NEGATIVE
SARS Coronavirus 2 by RT PCR: NEGATIVE

## 2023-12-08 LAB — BASIC METABOLIC PANEL WITH GFR
Anion gap: 11 (ref 5–15)
BUN: 12 mg/dL (ref 6–20)
CO2: 22 mmol/L (ref 22–32)
Calcium: 9 mg/dL (ref 8.9–10.3)
Chloride: 104 mmol/L (ref 98–111)
Creatinine, Ser: 0.47 mg/dL (ref 0.44–1.00)
GFR, Estimated: >60 mL/min (ref >60)
Glucose, Bld: 105 mg/dL — ABNORMAL HIGH (ref 70–99)
Potassium: 3.6 mmol/L (ref 3.5–5.1)
Sodium: 137 mmol/L (ref 135–145)

## 2023-12-08 LAB — TROPONIN I (HIGH SENSITIVITY)
Troponin I (High Sensitivity): 2 ng/L (ref <18)
Troponin I (High Sensitivity): <2 ng/L (ref <18)

## 2023-12-08 LAB — POC URINE PREG, ED: Preg Test, Ur: NEGATIVE

## 2023-12-08 NOTE — ED Notes (Signed)
 Pt requesting a pelvic exam once placed in an exam room.

## 2023-12-08 NOTE — ED Triage Notes (Signed)
 Pt arrives with c/p CP that started 4 days ago. Pt reports diarrhea for 2 weeks, SOB, and headache.

## 2023-12-09 ENCOUNTER — Emergency Department: Admission: EM | Admit: 2023-12-09 | Discharge: 2023-12-09 | Disposition: A | Discharge status: Home / Self Care

## 2023-12-09 DIAGNOSIS — R079 Chest pain, unspecified: Secondary | ICD-10-CM

## 2023-12-09 NOTE — ED Provider Notes (Signed)
 [facility] Provider Note    Event Date/Time   First MD Initiated Contact with Patient 12/09/23 0041     (approximate)   History   Chest Pain   HPI  Kylie [name] is a 33 y.o. female with a past medical history of irritable bowel syndrome, former history of smoking (quit 3 years ago) and former THC use (quit 3 weeks ago) who presents with 2 weeks of nausea, diarrhea and 4 days of chest pain.  Denies any shortness of breath, denies any cough.  Denies any fevers chills or abdominal pain.  She has no pain with urination.  She does not have any history of VTE not on any oral contraception. she does have a regular primary care physician.      Physical Exam   Triage Vital Signs: ED Triage Vitals  Encounter Vitals Group     BP 12/08/23 2034 128/87     Girls Systolic BP Percentile --      Girls Diastolic BP Percentile --      Boys Systolic BP Percentile --      Boys Diastolic BP Percentile --      Pulse Rate 12/08/23 2034 93     Resp 12/08/23 2034 18     Temp 12/08/23 2034 98.4 F (36.9 C)     Temp src --      SpO2 12/08/23 2034 99 %     Weight 12/08/23 2035 200 lb (90.7 kg)     Height --      Head Circumference --      Peak Flow --      Pain Score 12/08/23 2035 7     Pain Loc --      Pain Education --      Exclude from Growth Chart --     Most recent vital signs: Vitals:   12/08/23 2034 12/08/23 2234  BP: 128/87 131/77  Pulse: 93 86  Resp: 18 19  Temp: 98.4 F (36.9 C) 98.3 F (36.8 C)  SpO2: 99% 100%    Nursing Triage Note reviewed. Vital signs reviewed and patients oxygen saturation is normoxic  General: Patient is well nourished, well developed, awake and alert, resting comfortably in no acute distress Head: Normocephalic and atraumatic Eyes: Normal inspection, extraocular muscles intact, no conjunctival pallor Ear, nose, throat: Normal external exam Neck: Normal range of motion Respiratory: Patient is in no respiratory distress,  lungs CTAB Cardiovascular: Patient is not tachycardic, RRR without murmur appreciated GI: Abd SNT with no guarding or rebound  Back: Normal inspection of the back with good strength and range of motion throughout all ext Extremities: pulses intact with good cap refills, no LE pitting edema or calf tenderness Neuro: The patient is alert and oriented to person, place, and time, appropriately conversive, with 5/5 bilat UE/LE strength, no gross motor or sensory defects noted. Coordination appears to be adequate. Skin: Warm, dry, and intact Psych: normal mood and affect, no SI or HI  ED Results / Procedures / Treatments   Labs (all labs ordered are listed, but only abnormal results are displayed) Labs Reviewed  BASIC METABOLIC PANEL WITH GFR - Abnormal; Notable for the following components:      Result Value   Glucose, Bld 105 (*)    All other components within normal limits  RESP PANEL BY RT-PCR (RSV, FLU A&B, COVID)  [id number]  CBC  POC URINE PREG, ED  TROPONIN I (HIGH SENSITIVITY)  TROPONIN I (HIGH SENSITIVITY)  EKG EKG and rhythm strip are interpreted by myself:   EKG: [Normal sinus rhythm] at heart rate of 81, normal QRS duration, QTc 392, nonspecific ST segments and T waves no ectopy EKG not consistent with Acute STEMI Rhythm strip: NSR in lead II   RADIOLOGY Xray chest: No acute abnormality on my independent review interpretation radiologist agrees    PROCEDURES:  Critical Care performed: No  Procedures   MEDICATIONS ORDERED IN ED: Medications - No data to display   IMPRESSION / MDM / ASSESSMENT AND PLAN / ED COURSE           HEART Score: 1                     Differential diagnosis includes, but is not limited to, ACS, pneumonia, COVID/upper respiratory infection, electrolyte derangement anemia   ED course: Patient is well-appearing and EKG demonstrates no evidence of acute ischemia.  She does not meets PERC criteria, and history is not overwhelmingly  consistent with pulmonary embolism.  Troponin despite 4 days of chest pain is not elevated.  Chest x-ray demonstrated no pneumonia.  She had no leukocytosis no electrolyte derangements and her COVID influenza test was unremarkable.  She has already been able to tolerate p.o. here.  Her abdominal exam is completely benign and I do not think she needs any imaging of her abdomen at this time.  She feels comfortable following up with her primary care physician.  I did place an outpatient referral to cardiology as well.  All questions answered and patient voiced understanding and requested discharge  At time of discharge there is no evidence of acute life, limb, vision, or fertility threat. Patient has stable vital signs, pain is well controlled, patient is ambulatory and p.o. tolerant.  Discharge instructions were completed using the EPIC system. I would refer you to those at this time. All warnings prescriptions follow-up etc. were discussed in detail with the patient. Patient indicates understanding and is agreeable with this plan. All questions answered.  Patient is made aware that they may return to the emergency department for any worsening or new condition or for any other emergency.    Clinical Course as of 12/09/23 0059  Tue Dec 09, 2023  0042 Troponin I (High Sensitivity): <2 Reassuring after 4 days of symptoms [HD]  0042 Pulse Rate: 93 Not tachycardic [HD]  0042 SpO2: 99 % Not hypoxic, meets PERC criteria [HD]  0042 Basic metabolic panel(!) No electrolyte derangement [HD]  0042 CBC No anemia [HD]  0042 Resp panel by RT-PCR (RSV, Flu A&B, Covid) Anterior Nasal Swab Not positive for COVID [HD]    Clinical Course User Index [HD] Nicholaus Rolland BRAVO, MD   -- Risk: 5 This patient has a high risk of morbidity due to further diagnostic testing or treatment. Rationale: This patient's evaluation and management involve a high risk of morbidity due to the potential severity of presenting  symptoms, need for diagnostic testing, and/or initiation of treatment that may require close monitoring. The differential includes conditions with potential for significant deterioration or requiring escalation of care. Treatment decisions in the ED, including medication administration, procedural interventions, or disposition planning, reflect this level of risk. COPA: 5 The patient has the following acute or chronic illness/injury that poses a possible threat to life or bodily function: [X] : The patient has a potentially serious acute condition or an acute exacerbation of a chronic illness requiring urgent evaluation and management in the Emergency Department. The clinical presentation necessitates  immediate consideration of life-threatening or function-threatening diagnoses, even if they are ultimately ruled out.   FINAL CLINICAL IMPRESSION(S) / ED DIAGNOSES   Final diagnoses:  Chest pain, unspecified type     Rx / DC Orders   ED Discharge Orders          Ordered    Ambulatory referral to Cardiology       Comments: If you have not heard from the Cardiology office within the next 72 hours please call [phone].   12/09/23 0042             Note:  This document was prepared using Dragon voice recognition software and may include unintentional dictation errors.   Nicholaus Rolland BRAVO, MD 12/09/23 [phone]

## 2023-12-09 NOTE — Discharge Instructions (Addendum)

## 2024-01-05 NOTE — Progress Notes (Unsigned)
  Cardiology Office Note   Date:  01/05/2024  ID:  Kylie [name], DOB [birth year], MRN [id number] PCP: [name] Providers Cardiologist:  None { Click to update primary MD,subspecialty MD or APP then REFRESH:1}    History of Present Illness Kylie [name] is a 33 y.o. female no relevant PMH who presents for further evaluation and management of chest pain.  Patient was seen in the ED on 12/09/2023 for chest discomfort.  She reportedly had a viral syndrome preceding this for a week or 2.  Basic workup including CBC, BMP, serial troponin, and RVP panel were all negative.  Relevant CVD History -None   ROS: Pt denies any chest discomfort, jaw pain, arm pain, palpitations, syncope, presyncope, orthopnea, PND, or LE edema.  Studies Reviewed I have independently reviewed the patient's ECG, recent blood work, and previous medical record.  Physical Exam VS:  There were no vitals taken for this visit.       Wt Readings from Last 3 Encounters:  12/08/23 200 lb (90.7 kg)  05/17/23 200 lb (90.7 kg)  04/12/23 200 lb (90.7 kg)    GEN: No acute distress. NECK: No JVD; No carotid bruits. CARDIAC: ***RRR, no murmurs, rubs, gallops. RESPIRATORY:  Clear to auscultation. EXTREMITIES:  Warm and well-perfused. No edema.  ASSESSMENT AND PLAN Chest discomfort ***        {Are you ordering a CV Procedure (e.g. stress test, cath, DCCV, TEE, etc)?   Press F2        :[id number]}  Dispo: ***  Signed, Caron Poser, MD

## 2024-01-06 ENCOUNTER — Encounter (HOSPITAL_COMMUNITY): Payer: Self-pay

## 2024-01-06 ENCOUNTER — Emergency Department (HOSPITAL_COMMUNITY)

## 2024-01-06 ENCOUNTER — Ambulatory Visit

## 2024-01-06 ENCOUNTER — Inpatient Hospital Stay (HOSPITAL_COMMUNITY)
Admission: EM | Admit: 2024-01-06 | Discharge: 2024-01-15 | DRG: 956 | Disposition: A | Discharge status: Rehabilitation Facility | Attending: General Surgery | Admitting: General Surgery

## 2024-01-06 DIAGNOSIS — Z833 Family history of diabetes mellitus: Secondary | ICD-10-CM

## 2024-01-06 DIAGNOSIS — S36039A Unspecified laceration of spleen, initial encounter: Secondary | ICD-10-CM | POA: Diagnosis present

## 2024-01-06 DIAGNOSIS — Z87891 Personal history of nicotine dependence: Secondary | ICD-10-CM

## 2024-01-06 DIAGNOSIS — S72361A Displaced segmental fracture of shaft of right femur, initial encounter for closed fracture: Secondary | ICD-10-CM | POA: Diagnosis present

## 2024-01-06 DIAGNOSIS — Z820 Family history of epilepsy and other diseases of the nervous system: Secondary | ICD-10-CM

## 2024-01-06 DIAGNOSIS — S92111B Displaced fracture of neck of right talus, initial encounter for open fracture: Principal | ICD-10-CM | POA: Diagnosis present

## 2024-01-06 DIAGNOSIS — Z79899 Other long term (current) drug therapy: Secondary | ICD-10-CM

## 2024-01-06 DIAGNOSIS — Z8249 Family history of ischemic heart disease and other diseases of the circulatory system: Secondary | ICD-10-CM

## 2024-01-06 DIAGNOSIS — Z6827 Body mass index (BMI) 27.0-27.9, adult: Secondary | ICD-10-CM

## 2024-01-06 DIAGNOSIS — K567 Ileus, unspecified: Secondary | ICD-10-CM | POA: Diagnosis present

## 2024-01-06 DIAGNOSIS — S9304XA Dislocation of right ankle joint, initial encounter: Secondary | ICD-10-CM | POA: Diagnosis present

## 2024-01-06 DIAGNOSIS — S2241XA Multiple fractures of ribs, right side, initial encounter for closed fracture: Secondary | ICD-10-CM | POA: Diagnosis present

## 2024-01-06 DIAGNOSIS — Z808 Family history of malignant neoplasm of other organs or systems: Secondary | ICD-10-CM

## 2024-01-06 DIAGNOSIS — Z8041 Family history of malignant neoplasm of ovary: Secondary | ICD-10-CM

## 2024-01-06 DIAGNOSIS — S36031A Moderate laceration of spleen, initial encounter: Secondary | ICD-10-CM | POA: Diagnosis present

## 2024-01-06 DIAGNOSIS — E663 Overweight: Secondary | ICD-10-CM | POA: Diagnosis present

## 2024-01-06 DIAGNOSIS — Y92410 Unspecified street and highway as the place of occurrence of the external cause: Secondary | ICD-10-CM

## 2024-01-06 DIAGNOSIS — S92011B Displaced fracture of body of right calcaneus, initial encounter for open fracture: Secondary | ICD-10-CM | POA: Diagnosis present

## 2024-01-06 DIAGNOSIS — S72351A Displaced comminuted fracture of shaft of right femur, initial encounter for closed fracture: Secondary | ICD-10-CM

## 2024-01-06 DIAGNOSIS — D62 Acute posthemorrhagic anemia: Secondary | ICD-10-CM | POA: Diagnosis not present

## 2024-01-06 DIAGNOSIS — S36113A Laceration of liver, unspecified degree, initial encounter: Secondary | ICD-10-CM

## 2024-01-06 DIAGNOSIS — Z801 Family history of malignant neoplasm of trachea, bronchus and lung: Secondary | ICD-10-CM

## 2024-01-06 DIAGNOSIS — S36115A Moderate laceration of liver, initial encounter: Secondary | ICD-10-CM | POA: Diagnosis present

## 2024-01-06 DIAGNOSIS — Z818 Family history of other mental and behavioral disorders: Secondary | ICD-10-CM

## 2024-01-06 HISTORY — DX: Anxiety disorder, unspecified: F41.9

## 2024-01-06 LAB — ETHANOL: Alcohol, Ethyl (B): <15 mg/dL (ref <15)

## 2024-01-06 LAB — CBC
HCT: 34.4 % — ABNORMAL LOW (ref 36.0–46.0)
Hemoglobin: 11.7 g/dL — ABNORMAL LOW (ref 12.0–15.0)
MCH: 29.8 pg (ref 26.0–34.0)
MCHC: 34 g/dL (ref 30.0–36.0)
MCV: 87.8 fL (ref 80.0–100.0)
Platelets: 323 K/uL (ref 150–400)
RBC: 3.92 MIL/uL (ref 3.87–5.11)
RDW: 12.6 % (ref 11.5–15.5)
WBC: 17.3 K/uL — ABNORMAL HIGH (ref 4.0–10.5)
nRBC: 0 % (ref 0.0–0.2)

## 2024-01-06 LAB — I-STAT CG4 LACTIC ACID, ED: Lactic Acid, Venous: 2 mmol/L (ref 0.5–1.9)

## 2024-01-06 LAB — I-STAT CHEM 8, ED
BUN: 12 mg/dL (ref 6–20)
Calcium, Ion: 1.18 mmol/L (ref 1.15–1.40)
Chloride: 103 mmol/L (ref 98–111)
Creatinine, Ser: 0.7 mg/dL (ref 0.44–1.00)
Glucose, Bld: 128 mg/dL — ABNORMAL HIGH (ref 70–99)
HCT: 34 % — ABNORMAL LOW (ref 36.0–46.0)
Hemoglobin: 11.6 g/dL — ABNORMAL LOW (ref 12.0–15.0)
Potassium: 3.1 mmol/L — ABNORMAL LOW (ref 3.5–5.1)
Sodium: 141 mmol/L (ref 135–145)
TCO2: 24 mmol/L (ref 22–32)

## 2024-01-06 LAB — COMPREHENSIVE METABOLIC PANEL WITH GFR
ALT: 237 U/L — ABNORMAL HIGH (ref 0–44)
AST: 273 U/L — ABNORMAL HIGH (ref 15–41)
Albumin: 3.2 g/dL — ABNORMAL LOW (ref 3.5–5.0)
Alkaline Phosphatase: 97 U/L (ref 38–126)
Anion gap: 10 (ref 5–15)
BUN: 11 mg/dL (ref 6–20)
CO2: 23 mmol/L (ref 22–32)
Calcium: 8.3 mg/dL — ABNORMAL LOW (ref 8.9–10.3)
Chloride: 104 mmol/L (ref 98–111)
Creatinine, Ser: 0.72 mg/dL (ref 0.44–1.00)
GFR, Estimated: >60 mL/min (ref >60)
Glucose, Bld: 128 mg/dL — ABNORMAL HIGH (ref 70–99)
Potassium: 3.1 mmol/L — ABNORMAL LOW (ref 3.5–5.1)
Sodium: 137 mmol/L (ref 135–145)
Total Bilirubin: 0.6 mg/dL (ref 0.0–1.2)
Total Protein: 6.5 g/dL (ref 6.5–8.1)

## 2024-01-06 LAB — PROTIME-INR
INR: 1 (ref 0.8–1.2)
Prothrombin Time: 14.2 s (ref 11.4–15.2)

## 2024-01-06 MED ORDER — HYDROMORPHONE HCL 1 MG/ML IJ SOLN
1.0000 mg | Freq: Once | INTRAMUSCULAR | Status: AC
  Administered 2024-01-07: 1 mg via INTRAVENOUS
  Filled 2024-01-06: qty 1

## 2024-01-06 MED ORDER — SODIUM CHLORIDE 0.9 % IV BOLUS
125.0000 mL | Freq: Once | INTRAVENOUS | Status: AC
  Administered 2024-01-06: 125 mL via INTRAVENOUS

## 2024-01-06 MED ORDER — HYDROMORPHONE HCL 1 MG/ML IJ SOLN
1.0000 mg | Freq: Once | INTRAMUSCULAR | Status: AC
  Administered 2024-01-06: 1 mg via INTRAVENOUS
  Filled 2024-01-06: qty 1

## 2024-01-06 MED ORDER — TETANUS-DIPHTH-ACELL PERTUSSIS 5-2-15.5 LF-MCG/0.5 IM SUSP: 0.5000 mL | Freq: Once | INTRAMUSCULAR | Status: DC

## 2024-01-06 MED ORDER — ONDANSETRON HCL 4 MG/2ML IJ SOLN
4.0000 mg | Freq: Once | INTRAMUSCULAR | Status: AC
  Administered 2024-01-06: 4 mg via INTRAVENOUS
  Filled 2024-01-06: qty 2

## 2024-01-06 MED ORDER — IOHEXOL 350 MG/ML SOLN
75.0000 mL | Freq: Once | INTRAVENOUS | Status: AC | PRN
  Administered 2024-01-06: 75 mL via INTRAVENOUS

## 2024-01-06 MED ORDER — KETAMINE HCL 50 MG/5ML IJ SOSY
0.3000 mg/kg | PREFILLED_SYRINGE | Freq: Once | INTRAMUSCULAR | Status: AC
  Administered 2024-01-06: 27 mg via INTRAVENOUS
  Filled 2024-01-06: qty 5

## 2024-01-06 MED ORDER — METHOCARBAMOL 1000 MG/10ML IJ SOLN
1000.0000 mg | Freq: Once | INTRAMUSCULAR | Status: AC
  Administered 2024-01-07: 1000 mg via INTRAVENOUS
  Filled 2024-01-06: qty 10

## 2024-01-06 MED ORDER — CALCIUM GLUCONATE-NACL 2-0.675 GM/100ML-% IV SOLN
2.0000 g | Freq: Once | INTRAVENOUS | Status: AC
  Administered 2024-01-07: 2000 mg via INTRAVENOUS
  Filled 2024-01-06: qty 100

## 2024-01-06 NOTE — ED Notes (Signed)
 Delay in transport to CT d/t pregnancy test
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
## 2024-01-06 NOTE — ED Notes (Signed)
 Present Nurse spoke with Dempsey Reynolds
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
## 2024-01-06 NOTE — ED Notes (Signed)
 Trauma Event Note  Attempted to call patient's father Roshawn Lacina at [phone]. No answer, VM left to call ED desk number. Pt reports the number we have for mother is incorrect but she doesn't know any other numbers.  Shadrack Brummitt O Jaston Havens  Trauma Response RN  Please call TRN at [phone] for further assistance.
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
## 2024-01-07 ENCOUNTER — Inpatient Hospital Stay (HOSPITAL_COMMUNITY)

## 2024-01-07 ENCOUNTER — Encounter (HOSPITAL_COMMUNITY): Payer: Self-pay

## 2024-01-07 ENCOUNTER — Other Ambulatory Visit: Payer: Self-pay

## 2024-01-07 ENCOUNTER — Encounter (HOSPITAL_COMMUNITY): Admission: EM | Disposition: A | Discharge status: Rehabilitation Facility | Payer: Self-pay | Source: Home / Self Care

## 2024-01-07 ENCOUNTER — Inpatient Hospital Stay (HOSPITAL_COMMUNITY): Admitting: Certified Registered Nurse Anesthetist

## 2024-01-07 DIAGNOSIS — T1490XA Injury, unspecified, initial encounter: Secondary | ICD-10-CM | POA: Diagnosis not present

## 2024-01-07 DIAGNOSIS — S72361A Displaced segmental fracture of shaft of right femur, initial encounter for closed fracture: Secondary | ICD-10-CM | POA: Diagnosis not present

## 2024-01-07 DIAGNOSIS — S7291XA Unspecified fracture of right femur, initial encounter for closed fracture: Secondary | ICD-10-CM

## 2024-01-07 DIAGNOSIS — Z801 Family history of malignant neoplasm of trachea, bronchus and lung: Secondary | ICD-10-CM | POA: Diagnosis not present

## 2024-01-07 DIAGNOSIS — Z808 Family history of malignant neoplasm of other organs or systems: Secondary | ICD-10-CM | POA: Diagnosis not present

## 2024-01-07 DIAGNOSIS — R110 Nausea: Secondary | ICD-10-CM | POA: Diagnosis not present

## 2024-01-07 DIAGNOSIS — S92011B Displaced fracture of body of right calcaneus, initial encounter for open fracture: Secondary | ICD-10-CM | POA: Diagnosis not present

## 2024-01-07 DIAGNOSIS — Y92410 Unspecified street and highway as the place of occurrence of the external cause: Secondary | ICD-10-CM | POA: Diagnosis not present

## 2024-01-07 DIAGNOSIS — Z8249 Family history of ischemic heart disease and other diseases of the circulatory system: Secondary | ICD-10-CM | POA: Diagnosis not present

## 2024-01-07 DIAGNOSIS — Z820 Family history of epilepsy and other diseases of the nervous system: Secondary | ICD-10-CM | POA: Diagnosis not present

## 2024-01-07 DIAGNOSIS — Z818 Family history of other mental and behavioral disorders: Secondary | ICD-10-CM | POA: Diagnosis not present

## 2024-01-07 DIAGNOSIS — M79604 Pain in right leg: Secondary | ICD-10-CM | POA: Diagnosis not present

## 2024-01-07 DIAGNOSIS — S36115A Moderate laceration of liver, initial encounter: Secondary | ICD-10-CM | POA: Diagnosis not present

## 2024-01-07 DIAGNOSIS — S92001A Unspecified fracture of right calcaneus, initial encounter for closed fracture: Secondary | ICD-10-CM | POA: Diagnosis not present

## 2024-01-07 DIAGNOSIS — Z79899 Other long term (current) drug therapy: Secondary | ICD-10-CM | POA: Diagnosis not present

## 2024-01-07 DIAGNOSIS — Z833 Family history of diabetes mellitus: Secondary | ICD-10-CM | POA: Diagnosis not present

## 2024-01-07 DIAGNOSIS — S2241XA Multiple fractures of ribs, right side, initial encounter for closed fracture: Secondary | ICD-10-CM | POA: Diagnosis not present

## 2024-01-07 DIAGNOSIS — S9304XA Dislocation of right ankle joint, initial encounter: Secondary | ICD-10-CM | POA: Diagnosis not present

## 2024-01-07 DIAGNOSIS — S72351D Displaced comminuted fracture of shaft of right femur, subsequent encounter for closed fracture with routine healing: Secondary | ICD-10-CM | POA: Diagnosis not present

## 2024-01-07 DIAGNOSIS — K567 Ileus, unspecified: Secondary | ICD-10-CM | POA: Diagnosis not present

## 2024-01-07 DIAGNOSIS — K5901 Slow transit constipation: Secondary | ICD-10-CM | POA: Diagnosis not present

## 2024-01-07 DIAGNOSIS — E663 Overweight: Secondary | ICD-10-CM | POA: Diagnosis present

## 2024-01-07 DIAGNOSIS — N939 Abnormal uterine and vaginal bleeding, unspecified: Secondary | ICD-10-CM | POA: Diagnosis not present

## 2024-01-07 DIAGNOSIS — F54 Psychological and behavioral factors associated with disorders or diseases classified elsewhere: Secondary | ICD-10-CM | POA: Diagnosis not present

## 2024-01-07 DIAGNOSIS — D62 Acute posthemorrhagic anemia: Secondary | ICD-10-CM | POA: Diagnosis not present

## 2024-01-07 DIAGNOSIS — S92111B Displaced fracture of neck of right talus, initial encounter for open fracture: Secondary | ICD-10-CM | POA: Diagnosis not present

## 2024-01-07 DIAGNOSIS — S36031A Moderate laceration of spleen, initial encounter: Secondary | ICD-10-CM | POA: Diagnosis not present

## 2024-01-07 DIAGNOSIS — T07XXXA Unspecified multiple injuries, initial encounter: Secondary | ICD-10-CM | POA: Diagnosis not present

## 2024-01-07 DIAGNOSIS — Z6827 Body mass index (BMI) 27.0-27.9, adult: Secondary | ICD-10-CM | POA: Diagnosis not present

## 2024-01-07 DIAGNOSIS — Z8041 Family history of malignant neoplasm of ovary: Secondary | ICD-10-CM | POA: Diagnosis not present

## 2024-01-07 DIAGNOSIS — S36039A Unspecified laceration of spleen, initial encounter: Secondary | ICD-10-CM | POA: Diagnosis present

## 2024-01-07 DIAGNOSIS — Z87891 Personal history of nicotine dependence: Secondary | ICD-10-CM | POA: Diagnosis not present

## 2024-01-07 DIAGNOSIS — R52 Pain, unspecified: Secondary | ICD-10-CM | POA: Diagnosis not present

## 2024-01-07 HISTORY — PX: FEMUR IM NAIL: SHX1597

## 2024-01-07 HISTORY — PX: EXTERNAL FIXATION, ANKLE: SHX7570

## 2024-01-07 HISTORY — PX: IRRIGATION AND DEBRIDEMENT KNEE: SHX5185

## 2024-01-07 LAB — CBC
HCT: 32.4 % — ABNORMAL LOW (ref 36.0–46.0)
Hemoglobin: 11.2 g/dL — ABNORMAL LOW (ref 12.0–15.0)
MCH: 29.9 pg (ref 26.0–34.0)
MCHC: 34.6 g/dL (ref 30.0–36.0)
MCV: 86.4 fL (ref 80.0–100.0)
Platelets: 246 K/uL (ref 150–400)
RBC: 3.75 MIL/uL — ABNORMAL LOW (ref 3.87–5.11)
RDW: 12.5 % (ref 11.5–15.5)
WBC: 16 K/uL — ABNORMAL HIGH (ref 4.0–10.5)
nRBC: 0 % (ref 0.0–0.2)

## 2024-01-07 LAB — BASIC METABOLIC PANEL WITH GFR
Anion gap: 11 (ref 5–15)
BUN: 11 mg/dL (ref 6–20)
CO2: 20 mmol/L — ABNORMAL LOW (ref 22–32)
Calcium: 9 mg/dL (ref 8.9–10.3)
Chloride: 104 mmol/L (ref 98–111)
Creatinine, Ser: 0.85 mg/dL (ref 0.44–1.00)
GFR, Estimated: >60 mL/min (ref >60)
Glucose, Bld: 165 mg/dL — ABNORMAL HIGH (ref 70–99)
Potassium: 3.5 mmol/L (ref 3.5–5.1)
Sodium: 135 mmol/L (ref 135–145)

## 2024-01-07 LAB — HEPATIC FUNCTION PANEL
ALT: 228 U/L — ABNORMAL HIGH (ref 0–44)
AST: 235 U/L — ABNORMAL HIGH (ref 15–41)
Albumin: 3.2 g/dL — ABNORMAL LOW (ref 3.5–5.0)
Alkaline Phosphatase: 83 U/L (ref 38–126)
Bilirubin, Direct: <0.1 mg/dL (ref 0.0–0.2)
Total Bilirubin: 0.6 mg/dL (ref 0.0–1.2)
Total Protein: 6.3 g/dL — ABNORMAL LOW (ref 6.5–8.1)

## 2024-01-07 LAB — HCG, QUANTITATIVE, PREGNANCY: hCG, Beta Chain, Quant, S: <1 m[IU]/mL (ref <5)

## 2024-01-07 LAB — SURGICAL PCR SCREEN
MRSA, PCR: NEGATIVE
Staphylococcus aureus: POSITIVE — AB

## 2024-01-07 LAB — HCG, SERUM, QUALITATIVE: Preg, Serum: NEGATIVE

## 2024-01-07 LAB — HIV ANTIBODY (ROUTINE TESTING W REFLEX): HIV Screen 4th Generation wRfx: NONREACTIVE

## 2024-01-07 MED ORDER — METOPROLOL TARTRATE 5 MG/5ML IV SOLN
5.0000 mg | Freq: Four times a day (QID) | INTRAVENOUS | Status: DC | PRN
  Filled 2024-01-07: qty 5

## 2024-01-07 MED ORDER — HYDROMORPHONE HCL 1 MG/ML IJ SOLN
0.2500 mg | INTRAMUSCULAR | Status: DC | PRN
  Administered 2024-01-07: 0.5 mg via INTRAVENOUS
  Administered 2024-01-07 (×2): 0.25 mg via INTRAVENOUS

## 2024-01-07 MED ORDER — OXYCODONE HCL 5 MG PO TABS
5.0000 mg | ORAL_TABLET | ORAL | Status: DC | PRN
  Administered 2024-01-07 – 2024-01-12 (×20): 10 mg via ORAL
  Administered 2024-01-12: 5 mg via ORAL
  Administered 2024-01-12: 10 mg via ORAL
  Administered 2024-01-13: 5 mg via ORAL
  Administered 2024-01-13 – 2024-01-14 (×5): 10 mg via ORAL
  Administered 2024-01-14: 5 mg via ORAL
  Filled 2024-01-07 (×6): qty 2
  Filled 2024-01-07: qty 1
  Filled 2024-01-07 (×19): qty 2
  Filled 2024-01-07: qty 1
  Filled 2024-01-07 (×2): qty 2
  Filled 2024-01-07: qty 1
  Filled 2024-01-07: qty 2

## 2024-01-07 MED ORDER — LIDOCAINE 2% (20 MG/ML) 5 ML SYRINGE
INTRAMUSCULAR | Status: DC | PRN
  Administered 2024-01-07: 60 mg via INTRAVENOUS

## 2024-01-07 MED ORDER — HYDROMORPHONE HCL 1 MG/ML IJ SOLN
0.5000 mg | INTRAMUSCULAR | Status: DC | PRN
  Administered 2024-01-07 – 2024-01-09 (×9): 1 mg via INTRAVENOUS
  Filled 2024-01-07 (×9): qty 1

## 2024-01-07 MED ORDER — OXYCODONE HCL 5 MG PO TABS
10.0000 mg | ORAL_TABLET | Freq: Once | ORAL | Status: AC
  Administered 2024-01-07: 10 mg via ORAL
  Filled 2024-01-07: qty 2

## 2024-01-07 MED ORDER — PROPOFOL 10 MG/ML IV BOLUS
INTRAVENOUS | Status: AC
  Filled 2024-01-07: qty 20

## 2024-01-07 MED ORDER — CHLORHEXIDINE GLUCONATE 0.12 % MT SOLN
15.0000 mL | Freq: Once | OROMUCOSAL | Status: AC
  Administered 2024-01-07: 15 mL via OROMUCOSAL

## 2024-01-07 MED ORDER — MUPIROCIN 2 % EX OINT
1.0000 | TOPICAL_OINTMENT | Freq: Two times a day (BID) | CUTANEOUS | Status: AC
  Administered 2024-01-07 – 2024-01-12 (×10): 1 via NASAL
  Filled 2024-01-07: qty 22

## 2024-01-07 MED ORDER — OXYCODONE HCL 5 MG/5ML PO SOLN: 5.0000 mg | Freq: Once | ORAL | Status: DC | PRN

## 2024-01-07 MED ORDER — CEFAZOLIN SODIUM-DEXTROSE 2-4 GM/100ML-% IV SOLN
2.0000 g | Freq: Four times a day (QID) | INTRAVENOUS | Status: AC
  Administered 2024-01-07 – 2024-01-08 (×3): 2 g via INTRAVENOUS
  Filled 2024-01-07 (×3): qty 100

## 2024-01-07 MED ORDER — ONDANSETRON HCL 4 MG PO TABS: 4.0000 mg | ORAL_TABLET | Freq: Four times a day (QID) | ORAL | Status: DC | PRN

## 2024-01-07 MED ORDER — ONDANSETRON HCL 4 MG/2ML IJ SOLN
4.0000 mg | Freq: Four times a day (QID) | INTRAMUSCULAR | Status: DC | PRN
  Administered 2024-01-07 (×2): 4 mg via INTRAVENOUS
  Filled 2024-01-07 (×2): qty 2

## 2024-01-07 MED ORDER — KETAMINE HCL 50 MG/5ML IJ SOSY
PREFILLED_SYRINGE | INTRAMUSCULAR | Status: AC
  Filled 2024-01-07: qty 5

## 2024-01-07 MED ORDER — METHOCARBAMOL 1000 MG/10ML IJ SOLN
1000.0000 mg | Freq: Three times a day (TID) | INTRAMUSCULAR | Status: DC
  Administered 2024-01-07 – 2024-01-09 (×6): 1000 mg via INTRAVENOUS
  Filled 2024-01-07 (×6): qty 10

## 2024-01-07 MED ORDER — ENOXAPARIN SODIUM 40 MG/0.4ML IJ SOSY: 40.0000 mg | PREFILLED_SYRINGE | INTRAMUSCULAR | Status: DC

## 2024-01-07 MED ORDER — POLYETHYLENE GLYCOL 3350 17 G PO PACK: 17.0000 g | PACK | Freq: Every day | ORAL | Status: DC | PRN

## 2024-01-07 MED ORDER — PROPOFOL 10 MG/ML IV BOLUS
INTRAVENOUS | Status: DC | PRN
  Administered 2024-01-07: 120 mg via INTRAVENOUS

## 2024-01-07 MED ORDER — MUPIROCIN 2 % EX OINT: 1.0000 | TOPICAL_OINTMENT | Freq: Two times a day (BID) | CUTANEOUS | 0 refills | Status: DC

## 2024-01-07 MED ORDER — ONDANSETRON 4 MG PO TBDP: 4.0000 mg | ORAL_TABLET | Freq: Four times a day (QID) | ORAL | Status: DC | PRN

## 2024-01-07 MED ORDER — TRANEXAMIC ACID-NACL 1000-0.7 MG/100ML-% IV SOLN
1000.0000 mg | INTRAVENOUS | Status: AC
  Administered 2024-01-07: 1000 mg via INTRAVENOUS
  Filled 2024-01-07 (×2): qty 100

## 2024-01-07 MED ORDER — SUGAMMADEX SODIUM 200 MG/2ML IV SOLN
INTRAVENOUS | Status: DC | PRN
  Administered 2024-01-07: 354 mg via INTRAVENOUS

## 2024-01-07 MED ORDER — KETOROLAC TROMETHAMINE 15 MG/ML IJ SOLN
30.0000 mg | Freq: Four times a day (QID) | INTRAMUSCULAR | Status: AC
Start: 2024-01-07 — End: 2024-01-11
  Administered 2024-01-07 – 2024-01-11 (×18): 30 mg via INTRAVENOUS
  Filled 2024-01-07 (×18): qty 2

## 2024-01-07 MED ORDER — PHENYLEPHRINE HCL-NACL 20-0.9 MG/250ML-% IV SOLN
INTRAVENOUS | Status: DC | PRN
  Administered 2024-01-07: 10 ug/min via INTRAVENOUS

## 2024-01-07 MED ORDER — LIDOCAINE 2% (20 MG/ML) 5 ML SYRINGE
INTRAMUSCULAR | Status: AC
  Filled 2024-01-07: qty 5

## 2024-01-07 MED ORDER — AMISULPRIDE (ANTIEMETIC) 5 MG/2ML IV SOLN
INTRAVENOUS | Status: AC
  Filled 2024-01-07: qty 4

## 2024-01-07 MED ORDER — OXYCODONE HCL 5 MG PO TABS: 5.0000 mg | ORAL_TABLET | Freq: Once | ORAL | Status: DC | PRN

## 2024-01-07 MED ORDER — PHENYLEPHRINE 80 MCG/ML (10ML) SYRINGE FOR IV PUSH (FOR BLOOD PRESSURE SUPPORT)
PREFILLED_SYRINGE | INTRAVENOUS | Status: DC | PRN
  Administered 2024-01-07 (×10): 80 ug via INTRAVENOUS

## 2024-01-07 MED ORDER — DOCUSATE SODIUM 100 MG PO CAPS
100.0000 mg | ORAL_CAPSULE | Freq: Two times a day (BID) | ORAL | Status: DC
  Administered 2024-01-07: 100 mg via ORAL
  Filled 2024-01-07: qty 1

## 2024-01-07 MED ORDER — CEFAZOLIN SODIUM-DEXTROSE 2-4 GM/100ML-% IV SOLN
2.0000 g | INTRAVENOUS | Status: AC
Start: 2024-01-07 — End: 2024-01-07
  Administered 2024-01-07: 2 g via INTRAVENOUS
  Filled 2024-01-07: qty 100

## 2024-01-07 MED ORDER — SODIUM CHLORIDE 0.9 % IR SOLN
Status: DC | PRN
  Administered 2024-01-07: 3000 mL

## 2024-01-07 MED ORDER — ALBUMIN HUMAN 5 % IV SOLN: INTRAVENOUS | Status: DC | PRN

## 2024-01-07 MED ORDER — MIDAZOLAM HCL 2 MG/2ML IJ SOLN
INTRAMUSCULAR | Status: DC | PRN
  Administered 2024-01-07: 2 mg via INTRAVENOUS

## 2024-01-07 MED ORDER — ONDANSETRON HCL 4 MG/2ML IJ SOLN
INTRAMUSCULAR | Status: DC | PRN
  Administered 2024-01-07: 4 mg via INTRAVENOUS

## 2024-01-07 MED ORDER — ACETAMINOPHEN 500 MG PO TABS
1000.0000 mg | ORAL_TABLET | Freq: Four times a day (QID) | ORAL | Status: DC
  Administered 2024-01-07 – 2024-01-15 (×29): 1000 mg via ORAL
  Filled 2024-01-07 (×32): qty 2

## 2024-01-07 MED ORDER — 0.9 % SODIUM CHLORIDE (POUR BTL) OPTIME
TOPICAL | Status: DC | PRN
  Administered 2024-01-07: 1000 mL

## 2024-01-07 MED ORDER — ORAL CARE MOUTH RINSE: 15.0000 mL | Freq: Once | OROMUCOSAL | Status: AC

## 2024-01-07 MED ORDER — ONDANSETRON HCL 4 MG/2ML IJ SOLN
INTRAMUSCULAR | Status: AC
Start: 2024-01-07 — End: 2024-01-07
  Filled 2024-01-07: qty 2

## 2024-01-07 MED ORDER — CHLORHEXIDINE GLUCONATE 4 % EX SOLN: 1.0000 | CUTANEOUS | 1 refills | Status: AC

## 2024-01-07 MED ORDER — ACETAMINOPHEN 10 MG/ML IV SOLN
1000.0000 mg | Freq: Once | INTRAVENOUS | Status: DC | PRN
  Administered 2024-01-07: 1000 mg via INTRAVENOUS

## 2024-01-07 MED ORDER — FENTANYL CITRATE (PF) 250 MCG/5ML IJ SOLN
INTRAMUSCULAR | Status: AC
  Filled 2024-01-07: qty 5

## 2024-01-07 MED ORDER — CHLORHEXIDINE GLUCONATE CLOTH 2 % EX PADS
6.0000 | MEDICATED_PAD | Freq: Every day | CUTANEOUS | Status: DC
Start: 2024-01-07 — End: 2024-01-15
  Administered 2024-01-07 – 2024-01-13 (×7): 6 via TOPICAL

## 2024-01-07 MED ORDER — FENTANYL CITRATE (PF) 250 MCG/5ML IJ SOLN
INTRAMUSCULAR | Status: DC | PRN
  Administered 2024-01-07: 50 ug via INTRAVENOUS
  Administered 2024-01-07: 150 ug via INTRAVENOUS

## 2024-01-07 MED ORDER — ROCURONIUM BROMIDE 10 MG/ML (PF) SYRINGE
PREFILLED_SYRINGE | INTRAVENOUS | Status: AC
  Filled 2024-01-07: qty 10

## 2024-01-07 MED ORDER — ROCURONIUM BROMIDE 10 MG/ML (PF) SYRINGE
PREFILLED_SYRINGE | INTRAVENOUS | Status: DC | PRN
  Administered 2024-01-07: 10 mg via INTRAVENOUS
  Administered 2024-01-07: 30 mg via INTRAVENOUS
  Administered 2024-01-07: 10 mg via INTRAVENOUS
  Administered 2024-01-07: 80 mg via INTRAVENOUS

## 2024-01-07 MED ORDER — KETAMINE HCL 50 MG/5ML IJ SOSY
PREFILLED_SYRINGE | INTRAMUSCULAR | Status: DC | PRN
  Administered 2024-01-07: 10 mg via INTRAVENOUS
  Administered 2024-01-07: 30 mg via INTRAVENOUS

## 2024-01-07 MED ORDER — MIDAZOLAM HCL 2 MG/2ML IJ SOLN
INTRAMUSCULAR | Status: AC
  Filled 2024-01-07: qty 2

## 2024-01-07 MED ORDER — HYDROMORPHONE HCL 1 MG/ML IJ SOLN
INTRAMUSCULAR | Status: AC
  Filled 2024-01-07: qty 1

## 2024-01-07 MED ORDER — AMISULPRIDE (ANTIEMETIC) 5 MG/2ML IV SOLN
10.0000 mg | Freq: Once | INTRAVENOUS | Status: AC | PRN
  Administered 2024-01-07: 10 mg via INTRAVENOUS

## 2024-01-07 MED ORDER — POLYETHYLENE GLYCOL 3350 17 G PO PACK
17.0000 g | PACK | Freq: Every day | ORAL | Status: DC | PRN
  Administered 2024-01-09 – 2024-01-13 (×3): 17 g via ORAL
  Filled 2024-01-07 (×4): qty 1

## 2024-01-07 MED ORDER — ONDANSETRON HCL 4 MG/2ML IJ SOLN
4.0000 mg | Freq: Four times a day (QID) | INTRAMUSCULAR | Status: DC | PRN
  Administered 2024-01-07 – 2024-01-13 (×3): 4 mg via INTRAVENOUS
  Filled 2024-01-07 (×3): qty 2

## 2024-01-07 MED ORDER — HYDRALAZINE HCL 20 MG/ML IJ SOLN: 10.0000 mg | INTRAMUSCULAR | Status: DC | PRN

## 2024-01-07 MED ORDER — PHENYLEPHRINE 80 MCG/ML (10ML) SYRINGE FOR IV PUSH (FOR BLOOD PRESSURE SUPPORT)
PREFILLED_SYRINGE | INTRAVENOUS | Status: AC
Start: 2024-01-07 — End: 2024-01-07
  Filled 2024-01-07: qty 10

## 2024-01-07 MED ORDER — ENOXAPARIN SODIUM 30 MG/0.3ML IJ SOSY: 30.0000 mg | PREFILLED_SYRINGE | Freq: Two times a day (BID) | INTRAMUSCULAR | Status: DC

## 2024-01-07 MED ORDER — LACTATED RINGERS IV SOLN: INTRAVENOUS | Status: DC

## 2024-01-07 MED ORDER — SODIUM CHLORIDE 0.9 % IV SOLN: INTRAVENOUS | Status: AC

## 2024-01-07 MED ORDER — DEXAMETHASONE SOD PHOSPHATE PF 10 MG/ML IJ SOLN
INTRAMUSCULAR | Status: DC | PRN
  Administered 2024-01-07: 10 mg via INTRAVENOUS

## 2024-01-07 MED ORDER — ACETAMINOPHEN 10 MG/ML IV SOLN
INTRAVENOUS | Status: AC
  Filled 2024-01-07: qty 100

## 2024-01-07 NOTE — Anesthesia Procedure Notes (Signed)
 Procedure Name: Intubation Date/Time: 01/07/2024 10:00 AM  Performed by: Harrold Macintosh, CRNAPre-anesthesia Checklist: Patient identified, Emergency Drugs available, Suction available and Patient being monitored Patient Re-evaluated:Patient Re-evaluated prior to induction Oxygen Delivery Method: Circle system utilized Preoxygenation: Pre-oxygenation with 100% oxygen Induction Type: IV induction Ventilation: Mask ventilation without difficulty Laryngoscope Size: Miller and 2 Grade View: Grade II Tube type: Oral Tube size: 7.0 mm Number of attempts: 1 Airway Equipment and Method: Stylet and Bite block Placement Confirmation: ETT inserted through vocal cords under direct vision, positive ETCO2 and breath sounds checked- equal and bilateral Secured at: 21 cm Tube secured with: Tape Dental Injury: Teeth and Oropharynx as per pre-operative assessment
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
## 2024-01-07 NOTE — Progress Notes (Signed)
 Transition of Care [facility]) - CAGE-AID Screening   Patient Details  Name: Kylie [name] MRN: [id number] Date of Birth: [birth year]  Transition of Care [facility]) CM/SW Contact:    Sallyanne MALVA Mettle, RN Phone Number: 01/07/2024, 12:54 AM   Clinical Narrative:  Reports no alcohol and drug use currently, no resources indicated.  CAGE-AID Screening:    Have You Ever Felt You Ought to Cut Down on Your Drinking or Drug Use?: No Have People Annoyed You By Critizing Your Drinking Or Drug Use?: No Have You Felt Bad Or Guilty About Your Drinking Or Drug Use?: No Have You Ever Had a Drink or Used Drugs First Thing In The Morning to Steady Your Nerves or to Get Rid of a Hangover?: No CAGE-AID Score: 0  Substance Abuse Education Offered: No

## 2024-01-07 NOTE — ED Notes (Signed)
Trauma provider at bedside

## 2024-01-07 NOTE — Progress Notes (Signed)
Patient off unit to OR.
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
## 2024-01-07 NOTE — ED Notes (Signed)
 Per report from [facility] patient received 2 g of Ancef enroute prehospital.

## 2024-01-07 NOTE — ED Notes (Signed)
Patient father bedside.
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
## 2024-01-07 NOTE — Progress Notes (Signed)
   01/07/24 1348  Vitals  Temp 98.4 F (36.9 C)  Temp Source Oral  BP (!) 144/90  MAP (mmHg) 105  BP Location Left Arm  BP Method Automatic  Patient Position (if appropriate) Lying  Pulse Rate (!) 104  Pulse Rate Source Monitor  ECG Heart Rate (!) 107  Resp 18  Level of Consciousness  Level of Consciousness Alert  MEWS COLOR  MEWS Score Color Green  Oxygen Therapy  SpO2 99 %  O2 Device Nasal Cannula  O2 Flow Rate (L/min) 3 L/min  MEWS Score  MEWS Temp 0  MEWS Systolic 0  MEWS Pulse 1  MEWS RR 0  MEWS LOC 0  MEWS Score 1   Patient returned to unit after procedure. Vitals as listed above. A&Ox4. Complaining of 10/10 right leg pain and nausea. PRN pain medication administered along with scheduled meds. Trauma team notified of nausea. Wound vac to right ankle medial in place, continuous 125 suction. External fixator in place. Dressings to right thigh and knee C/D/I. Cardiac tele applied, bed alarm on, call bell within reach.
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
## 2024-01-07 NOTE — Transfer of Care (Signed)
 Immediate Anesthesia Transfer of Care Note  Patient: Kylie [name]  Procedure(s) Performed: INSERTION, INTRAMEDULLARY ROD, FEMUR, RETROGRADE (Right) IRRIGATION AND DEBRIDEMENT KNEE (Right: Knee) EXTERNAL FIXATION OF RIGHT TALUS WITH PLACEMENT OF WOUND VAC (Ankle)  Patient Location: PACU  Anesthesia Type:General  Level of Consciousness: awake, alert , and oriented  Airway & Oxygen Therapy: Patient Spontanous Breathing and Patient connected to face mask oxygen  Post-op Assessment: Report given to RN, Post -op Vital signs reviewed and stable, Patient moving all extremities X 4, and Patient able to stick tongue midline  Post vital signs: Reviewed and stable  Last Vitals:  Vitals Value Taken Time  BP 147/94 01/07/24 12:45  Temp 97.9   Pulse 119 01/07/24 12:47  Resp 15 01/07/24 12:47  SpO2 92 % 01/07/24 12:47  Vitals shown include unfiled device data.  Last Pain:  Vitals:   01/07/24 0840  TempSrc:   PainSc: 8       Patients Stated Pain Goal: 2 (01/07/24 0840)  Complications: No notable events documented.
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
## 2024-01-08 ENCOUNTER — Inpatient Hospital Stay (HOSPITAL_COMMUNITY)

## 2024-01-08 ENCOUNTER — Encounter (HOSPITAL_COMMUNITY): Admission: EM | Disposition: A | Discharge status: Rehabilitation Facility | Payer: Self-pay | Source: Home / Self Care

## 2024-01-08 ENCOUNTER — Encounter (HOSPITAL_COMMUNITY): Payer: Self-pay

## 2024-01-08 DIAGNOSIS — S92001A Unspecified fracture of right calcaneus, initial encounter for closed fracture: Secondary | ICD-10-CM | POA: Diagnosis not present

## 2024-01-08 HISTORY — PX: ORIF CALCANEOUS FRACTURE: SHX5030

## 2024-01-08 LAB — CBC
HCT: 26.3 % — ABNORMAL LOW (ref 36.0–46.0)
HCT: 23.6 % — ABNORMAL LOW (ref 36.0–46.0)
Hemoglobin: 9.2 g/dL — ABNORMAL LOW (ref 12.0–15.0)
Hemoglobin: 8 g/dL — ABNORMAL LOW (ref 12.0–15.0)
MCH: 30.7 pg (ref 26.0–34.0)
MCH: 30.1 pg (ref 26.0–34.0)
MCHC: 35 g/dL (ref 30.0–36.0)
MCHC: 33.9 g/dL (ref 30.0–36.0)
MCV: 87.7 fL (ref 80.0–100.0)
MCV: 88.7 fL (ref 80.0–100.0)
Platelets: 157 K/uL (ref 150–400)
Platelets: 143 K/uL — ABNORMAL LOW (ref 150–400)
RBC: 3 MIL/uL — ABNORMAL LOW (ref 3.87–5.11)
RBC: 2.66 MIL/uL — ABNORMAL LOW (ref 3.87–5.11)
RDW: 12.8 % (ref 11.5–15.5)
RDW: 12.9 % (ref 11.5–15.5)
WBC: 7 K/uL (ref 4.0–10.5)
WBC: 8.6 K/uL (ref 4.0–10.5)
nRBC: 0 % (ref 0.0–0.2)
nRBC: 0 % (ref 0.0–0.2)

## 2024-01-08 LAB — BASIC METABOLIC PANEL WITH GFR
Anion gap: 7 (ref 5–15)
BUN: 5 mg/dL — ABNORMAL LOW (ref 6–20)
CO2: 27 mmol/L (ref 22–32)
Calcium: 8.3 mg/dL — ABNORMAL LOW (ref 8.9–10.3)
Chloride: 104 mmol/L (ref 98–111)
Creatinine, Ser: 0.57 mg/dL (ref 0.44–1.00)
GFR, Estimated: >60 mL/min (ref >60)
Glucose, Bld: 129 mg/dL — ABNORMAL HIGH (ref 70–99)
Potassium: 3.7 mmol/L (ref 3.5–5.1)
Sodium: 138 mmol/L (ref 135–145)

## 2024-01-08 MED ORDER — FENTANYL CITRATE (PF) 100 MCG/2ML IJ SOLN
25.0000 ug | INTRAMUSCULAR | Status: DC | PRN
  Administered 2024-01-08: 50 ug via INTRAVENOUS

## 2024-01-08 MED ORDER — ONDANSETRON HCL 4 MG/2ML IJ SOLN
INTRAMUSCULAR | Status: DC | PRN
  Administered 2024-01-08: 4 mg via INTRAVENOUS

## 2024-01-08 MED ORDER — BUPIVACAINE HCL (PF) 0.25 % IJ SOLN
INTRAMUSCULAR | Status: AC
  Filled 2024-01-08: qty 30

## 2024-01-08 MED ORDER — PHENYLEPHRINE HCL (PRESSORS) 10 MG/ML IV SOLN
INTRAVENOUS | Status: DC | PRN
  Administered 2024-01-08: 80 ug via INTRAVENOUS
  Administered 2024-01-08: 160 ug via INTRAVENOUS
  Administered 2024-01-08 (×2): 80 ug via INTRAVENOUS

## 2024-01-08 MED ORDER — FENTANYL CITRATE (PF) 100 MCG/2ML IJ SOLN
INTRAMUSCULAR | Status: AC
  Filled 2024-01-08: qty 2

## 2024-01-08 MED ORDER — OXYCODONE HCL 5 MG/5ML PO SOLN: 5.0000 mg | Freq: Once | ORAL | Status: DC | PRN

## 2024-01-08 MED ORDER — DEXAMETHASONE SOD PHOSPHATE PF 10 MG/ML IJ SOLN
INTRAMUSCULAR | Status: DC | PRN
  Administered 2024-01-08: 10 mg via INTRAVENOUS

## 2024-01-08 MED ORDER — DEXMEDETOMIDINE HCL IN NACL 80 MCG/20ML IV SOLN
INTRAVENOUS | Status: DC | PRN
  Administered 2024-01-08: 8 ug via INTRAVENOUS
  Administered 2024-01-08: 4 ug via INTRAVENOUS

## 2024-01-08 MED ORDER — PROPOFOL 10 MG/ML IV BOLUS
INTRAVENOUS | Status: DC | PRN
  Administered 2024-01-08: 25 ug/kg/min via INTRAVENOUS
  Administered 2024-01-08: 200 mg via INTRAVENOUS

## 2024-01-08 MED ORDER — LIDOCAINE 2% (20 MG/ML) 5 ML SYRINGE
INTRAMUSCULAR | Status: DC | PRN
  Administered 2024-01-08: 100 mg via INTRAVENOUS

## 2024-01-08 MED ORDER — ALBUMIN HUMAN 5 % IV SOLN: INTRAVENOUS | Status: DC | PRN

## 2024-01-08 MED ORDER — FENTANYL CITRATE (PF) 250 MCG/5ML IJ SOLN
INTRAMUSCULAR | Status: DC | PRN
  Administered 2024-01-08 (×2): 50 ug via INTRAVENOUS
  Administered 2024-01-08: 100 ug via INTRAVENOUS

## 2024-01-08 MED ORDER — OXYCODONE HCL 5 MG PO TABS: 5.0000 mg | ORAL_TABLET | Freq: Once | ORAL | Status: DC | PRN

## 2024-01-08 MED ORDER — HYDROMORPHONE HCL 1 MG/ML IJ SOLN
INTRAMUSCULAR | Status: AC
  Filled 2024-01-08: qty 0.5

## 2024-01-08 MED ORDER — 0.9 % SODIUM CHLORIDE (POUR BTL) OPTIME
TOPICAL | Status: DC | PRN
  Administered 2024-01-08: 1000 mL

## 2024-01-08 MED ORDER — LACTATED RINGERS IV SOLN: INTRAVENOUS | Status: DC

## 2024-01-08 MED ORDER — DROPERIDOL 2.5 MG/ML IJ SOLN
0.6250 mg | Freq: Once | INTRAMUSCULAR | Status: AC | PRN
  Administered 2024-01-08: 0.625 mg via INTRAVENOUS

## 2024-01-08 MED ORDER — METOPROLOL TARTRATE 5 MG/5ML IV SOLN
2.5000 mg | Freq: Once | INTRAVENOUS | Status: AC
  Administered 2024-01-08: 2.5 mg via INTRAVENOUS

## 2024-01-08 MED ORDER — CHLORHEXIDINE GLUCONATE 0.12 % MT SOLN
OROMUCOSAL | Status: AC
  Administered 2024-01-08: 15 mL via OROMUCOSAL
  Filled 2024-01-08: qty 15

## 2024-01-08 MED ORDER — ONDANSETRON HCL 4 MG/2ML IJ SOLN
4.0000 mg | Freq: Four times a day (QID) | INTRAMUSCULAR | Status: DC
  Administered 2024-01-08 – 2024-01-09 (×2): 4 mg via INTRAVENOUS
  Filled 2024-01-08 (×2): qty 2

## 2024-01-08 MED ORDER — CHLORHEXIDINE GLUCONATE 0.12 % MT SOLN: 15.0000 mL | Freq: Once | OROMUCOSAL | Status: AC

## 2024-01-08 MED ORDER — MIDAZOLAM HCL 2 MG/2ML IJ SOLN
INTRAMUSCULAR | Status: AC
  Filled 2024-01-08: qty 2

## 2024-01-08 MED ORDER — ACETAMINOPHEN 10 MG/ML IV SOLN: 1000.0000 mg | Freq: Once | INTRAVENOUS | Status: DC | PRN

## 2024-01-08 MED ORDER — ORAL CARE MOUTH RINSE: 15.0000 mL | Freq: Once | OROMUCOSAL | Status: AC

## 2024-01-08 MED ORDER — SUGAMMADEX SODIUM 200 MG/2ML IV SOLN
INTRAVENOUS | Status: DC | PRN
  Administered 2024-01-08: 200 mg via INTRAVENOUS

## 2024-01-08 MED ORDER — ROCURONIUM BROMIDE 10 MG/ML (PF) SYRINGE
PREFILLED_SYRINGE | INTRAVENOUS | Status: DC | PRN
  Administered 2024-01-08: 10 mg via INTRAVENOUS
  Administered 2024-01-08: 70 mg via INTRAVENOUS
  Administered 2024-01-08: 20 mg via INTRAVENOUS
  Administered 2024-01-08: 30 mg via INTRAVENOUS
  Administered 2024-01-08: 20 mg via INTRAVENOUS

## 2024-01-08 MED ORDER — DROPERIDOL 2.5 MG/ML IJ SOLN
INTRAMUSCULAR | Status: AC
  Filled 2024-01-08: qty 2

## 2024-01-08 MED ORDER — MIDAZOLAM HCL (PF) 2 MG/2ML IJ SOLN
INTRAMUSCULAR | Status: DC | PRN
  Administered 2024-01-08: 2 mg via INTRAVENOUS

## 2024-01-08 MED ORDER — CEFAZOLIN SODIUM-DEXTROSE 2-4 GM/100ML-% IV SOLN
2.0000 g | Freq: Once | INTRAVENOUS | Status: AC
  Administered 2024-01-08: 2 g via INTRAVENOUS
  Filled 2024-01-08: qty 100

## 2024-01-08 MED ORDER — VASHE WOUND IRRIGATION OPTIME
TOPICAL | Status: DC | PRN
  Administered 2024-01-08: 34 [oz_av]

## 2024-01-08 MED ORDER — METOPROLOL TARTRATE 5 MG/5ML IV SOLN
INTRAVENOUS | Status: AC
  Filled 2024-01-08: qty 5

## 2024-01-08 MED ORDER — HYDROMORPHONE HCL 1 MG/ML IJ SOLN
INTRAMUSCULAR | Status: DC | PRN
  Administered 2024-01-08 (×2): .5 mg via INTRAVENOUS

## 2024-01-08 NOTE — Progress Notes (Signed)
 PT Cancellation Note  Patient Details Name: Kylie [name] MRN: [id number] DOB: February 14, 1991   Cancelled Treatment:    Reason Eval/Treat Not Completed: (P) Patient not medically ready. Burnard Louder, PA, requesting therapy hold off this morning due to hemoglobin drop and potential ankle surgery today. Will plan to follow-up as able.   Theo Ferretti, PT, DPT [facility] Services  Office: [phone]    [name] 01/08/2024, 7:48 AM

## 2024-01-08 NOTE — Progress Notes (Signed)
 Please keep patient NPO for possible talus surgery today.  Ozell Bruch, MD [facility], [facility] [phone]
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
## 2024-01-08 NOTE — Progress Notes (Addendum)
 Pt is able to rest now. She is stable hemodynamically, afebrile, no acute distress. Right leg dressing is dry, clean and intact. No active bleeding.   We have seen her pain and anxiety and blood volume loss in OR could be the cause of tachycardia. Per PACU RN reported Pt got 2,000 ml of LR bolus and Albumin 5% in OR.  Total  estimate blood loss from yesterday was 200 ml and today was 150 ml in the OR.    Her Hb baseline on 12/08/23 13.4 now drops to 8.0. MD is aware. We continue to monitor closely.    01/08/24 2245  Vitals  Temp 99.1 F (37.3 C)  Temp Source Oral  BP 127/66  MAP (mmHg) 80  BP Location Left Arm  BP Method Automatic  Patient Position (if appropriate) Lying  Pulse Rate (!) 110  Pulse Rate Source Monitor  ECG Heart Rate (!) 118  Resp 14  Level of Consciousness  Level of Consciousness Alert  Oxygen Therapy  SpO2 100 %  O2 Device Nasal Cannula  O2 Flow Rate (L/min) 3 L/min  Pain Assessment  Pain Scale 0-10  Pain Score Asleep    Wendi Dash, RN

## 2024-01-08 NOTE — Anesthesia Procedure Notes (Signed)
 Procedure Name: Intubation Date/Time: 01/08/2024 1:09 PM  Performed by: Jader Desai, CRNAPre-anesthesia Checklist: Patient identified, Emergency Drugs available, Suction available and Patient being monitored Patient Re-evaluated:Patient Re-evaluated prior to induction Oxygen Delivery Method: Circle System Utilized Preoxygenation: Pre-oxygenation with 100% oxygen Induction Type: IV induction Ventilation: Mask ventilation without difficulty Laryngoscope Size: Glidescope and 4 Grade View: Grade I Tube type: Oral Number of attempts: 1 Airway Equipment and Method: Stylet and Oral airway Placement Confirmation: ETT inserted through vocal cords under direct vision, positive ETCO2 and breath sounds checked- equal and bilateral Tube secured with: Tape Dental Injury: Teeth and Oropharynx as per pre-operative assessment  Difficulty Due To: Difficulty was unanticipated

## 2024-01-08 NOTE — Anesthesia Postprocedure Evaluation (Signed)
 Anesthesia Post Note  Patient: Kylie [name]  Procedure(s) Performed: INSERTION, INTRAMEDULLARY ROD, FEMUR, RETROGRADE (Right) IRRIGATION AND DEBRIDEMENT KNEE (Right: Knee) EXTERNAL FIXATION OF RIGHT TALUS WITH PLACEMENT OF WOUND VAC (Ankle)     Patient location during evaluation: PACU Anesthesia Type: General Level of consciousness: awake and alert Pain management: pain level controlled Vital Signs Assessment: post-procedure vital signs reviewed and stable Respiratory status: spontaneous breathing, nonlabored ventilation and respiratory function stable Cardiovascular status: blood pressure returned to baseline and stable Postop Assessment: no apparent nausea or vomiting Anesthetic complications: no   No notable events documented.               Timia Casselman
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
## 2024-01-08 NOTE — Transfer of Care (Signed)
 Immediate Anesthesia Transfer of Care Note  Patient: Kylie [name]  Procedure(s) Performed: OPEN REDUCTION INTERNAL FIXATION (ORIF) CALCANEOUS FRACTURE (Right)  Patient Location: PACU  Anesthesia Type:General  Level of Consciousness: oriented, drowsy, patient cooperative, and responds to stimulation  Airway & Oxygen Therapy: Patient Spontanous Breathing and Patient connected to nasal cannula oxygen  Post-op Assessment: Report given to RN, Post -op Vital signs reviewed and stable, Patient moving all extremities X 4, and Patient able to stick tongue midline  Post vital signs: Reviewed and stable  Last Vitals:  Vitals Value Taken Time  BP 140/85 01/08/24 17:46  Temp 98.6   Pulse 127 01/08/24 17:50  Resp 15 01/08/24 17:50  SpO2 90 % 01/08/24 17:50  Vitals shown include unfiled device data.  Last Pain:  Vitals:   01/08/24 1112  TempSrc:   PainSc: 7       Patients Stated Pain Goal: 2 (01/07/24 0840)  Complications: No notable events documented.

## 2024-01-08 NOTE — Progress Notes (Signed)
 OT Cancellation Note  Patient Details Name: KELLYJO EDGREN MRN: [id number] DOB: 12-27-1990   Cancelled Treatment:    Reason Eval/Treat Not Completed: Patient not medically ready Burnard Louder, PA, requesting therapy hold off this morning due to hemoglobin drop and potential ankle surgery today. Will plan to follow-up as able.   Etta NOVAK, OT Acute Rehabilitation Services Office [phone] Secure Chat Preferred    Etta GORMAN Hope 01/08/2024, 7:50 AM

## 2024-01-08 NOTE — Progress Notes (Signed)
 Pt is alert and fully oriented x 4, afebrile, stable hemodynamically, on 3 LPM of O2 NCL, SPO2 97-100%, no acute distress noted overnight.EKG is  NSR/ST on the monitor. Occasionally has yellow MEWS from ST due to pain.  Pain and feeling nausea are her major complaints. We alternate pain medication management with Oxycodone , dilaudid , Tylenol , Toradol  and Robaxin. Pain is tolerated well after interventions. She can rest and sleep.. Incisions on right leg are dry and clean, no bleeding or hematoma. Right ankle wound vac has minimal serosanguinous drainage. Pt is on bedrest for 24 hours per MD noted for bleeding precaution. Pt is able to do self- tilt and turn slightly. Plan of care is reviewed. We will continue to monitor.  Wendi Dash, RN

## 2024-01-08 NOTE — Progress Notes (Signed)
 Orthopaedic Progress Note  S: Patient is resting comfortably in bed.  She reports pain in the right thigh and ankle but tolerable for her.  O:  Vitals:   01/08/24 0400 01/08/24 0642  BP: 123/79   Pulse: 79 91  Resp: 12 10  Temp:    SpO2: 100% 100%    Clean dressings on her right thigh.  Thigh soft and compressible.  No calf tenderness.  External fixator is in place.  Pin sites.  Be cleaned.  VAC dressing is holding suction.  She is intact station the saphenous sural tibial and peroneal nerve distributions.  3 out of 5 strength EHL and FHL  Imaging: Postoperative CT scan shows reduction of the talar fracture.  Labs:  Results for orders placed or performed during the hospital encounter of 01/06/24 (from the past 24 hours)  Surgical pcr screen     Status: Abnormal   Collection Time: 01/07/24  7:58 AM  Result Value Ref Range   MRSA, PCR NEGATIVE NEGATIVE   Staphylococcus aureus POSITIVE (A) NEGATIVE  CBC     Status: Abnormal   Collection Time: 01/08/24  5:26 AM  Result Value Ref Range   WBC 7.0 4.0 - 10.5 K/uL   RBC 3.00 (L) 3.87 - 5.11 MIL/uL   Hemoglobin 9.2 (L) 12.0 - 15.0 g/dL   HCT 73.6 (L) 63.9 - 53.9 %   MCV 87.7 80.0 - 100.0 fL   MCH 30.7 26.0 - 34.0 pg   MCHC 35.0 30.0 - 36.0 g/dL   RDW 87.1 88.4 - 84.4 %   Platelets 157 150 - 400 K/uL   nRBC 0.0 0.0 - 0.2 %    Assessment: Postop day 1 status post IM rod right femur and closed reduction and external fixation of right open talus fracture with incision debridement and application of VAC dressing  The patient is doing well the day after surgery.  Her femur fracture incisions are healing nicely.  Her H&H appears stable.  She still has ongoing issues with the right open talus fracture.  This will require repeat debridement of tomorrow with possible closure versus revision of the VAC dressing.  She will also likely require additional surgery for definitive stabilization of the talus fracture.  I discussed this with Dr. Kendal  and Dr. Celena who hopefully help with further management.  In the meantime we will plan for repeat surgery tomorrow.  This is a knee done by myself or Dr. Celena.  She should continue be nonweightbearing on the right leg.  Will continue with her current pain regimen.  Start Lovenox for DVT prophylaxis.  Continue the current bowel regimen  Injuries: Right femur fracture, right open talus fracture dislocation  Weightbearing: Nonweightbearing right leg  Insicional and dressing care: Can change thigh dressings tomorrow, keep VAC dressing in place, pin site care  Orthopedic device(s): None  CV/Blood loss: H&H stable  Pain management: Continue current pain regimen  VTE prophylaxis: Lovenox  ID: 23 hours of Ancef.    Cordella Rhein, MD, MS [facility] [phone]

## 2024-01-08 NOTE — Progress Notes (Addendum)
 Pt was transferred from PACU. She was alert and fully oriented x 4. Pt is afebrile, vital signs at arrival per documented below. MEWS was red. Pt had complaints of severe pain on her right leg, nausea, checking, anxiety, chills and shivering. Warm blanket was given. Emotional support, Dilaudid  1 mg IV given. In PACU,MD made aware that her HR has been tachycardia. Right leg incision with ACE dressing in clean and dry. Right toes capillary refilled < 3 seconds.    01/08/24 1930  Vitals  Temp 98.4 F (36.9 C)  Temp Source Oral  BP (!) 151/71  MAP (mmHg) 96  BP Location Left Arm  BP Method Automatic  Patient Position (if appropriate) Lying  Pulse Rate (!) 169  Pulse Rate Source Monitor  ECG Heart Rate (!) 169  Resp (!) 24  Level of Consciousness  Level of Consciousness Alert  MEWS COLOR  MEWS Score Color Red  Oxygen Therapy  SpO2 95 %  O2 Device Nasal Cannula  O2 Flow Rate (L/min) 4 L/min  Pain Assessment  Pain Scale 0-10  Pain Score 10  Pain Type Surgical pain  Pain Location Leg  Pain Orientation Right  Pain Intervention(s) Medication (See eMAR)  Complaints & Interventions  Complains of Anxiety;Shaking / Shivering (severe pain)  Interventions Medication (see MAR);Dark room;Relaxation;Reposition  Neuro symptoms relieved by Rest;Relaxation techniques (Comment)  Patients response to intervention Effective  Glasgow Coma Scale  Eye Opening 4  Best Verbal Response (NON-intubated) 5  Best Motor Response 6  Glasgow Coma Scale Score 15   Later her pain score is continuing to be 9/10. Robaxin, Toradol , Tylenol  and Zofran  were given. Pt has been able to rest after interventions. We continue to manage her pain effectively and closely monitor.   Wendi Dash, RN
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
## 2024-01-09 ENCOUNTER — Inpatient Hospital Stay (HOSPITAL_COMMUNITY)

## 2024-01-09 ENCOUNTER — Encounter (HOSPITAL_COMMUNITY): Payer: Self-pay | Admitting: Orthopedic Surgery

## 2024-01-09 LAB — PREPARE RBC (CROSSMATCH)

## 2024-01-09 LAB — CBC
HCT: 20.2 % — ABNORMAL LOW (ref 36.0–46.0)
HCT: 24.8 % — ABNORMAL LOW (ref 36.0–46.0)
Hemoglobin: 7.1 g/dL — ABNORMAL LOW (ref 12.0–15.0)
Hemoglobin: 8.3 g/dL — ABNORMAL LOW (ref 12.0–15.0)
MCH: 31 pg (ref 26.0–34.0)
MCH: 29.7 pg (ref 26.0–34.0)
MCHC: 35.1 g/dL (ref 30.0–36.0)
MCHC: 33.5 g/dL (ref 30.0–36.0)
MCV: 88.2 fL (ref 80.0–100.0)
MCV: 88.9 fL (ref 80.0–100.0)
Platelets: 117 K/uL — ABNORMAL LOW (ref 150–400)
Platelets: 133 K/uL — ABNORMAL LOW (ref 150–400)
RBC: 2.29 MIL/uL — ABNORMAL LOW (ref 3.87–5.11)
RBC: 2.79 MIL/uL — ABNORMAL LOW (ref 3.87–5.11)
RDW: 13 % (ref 11.5–15.5)
RDW: 13 % (ref 11.5–15.5)
WBC: 6.5 K/uL (ref 4.0–10.5)
WBC: 7 K/uL (ref 4.0–10.5)
nRBC: 0 % (ref 0.0–0.2)
nRBC: 0.3 % — ABNORMAL HIGH (ref 0.0–0.2)

## 2024-01-09 LAB — URINALYSIS, ROUTINE W REFLEX MICROSCOPIC
Bilirubin Urine: NEGATIVE
Glucose, UA: NEGATIVE mg/dL
Hgb urine dipstick: NEGATIVE
Ketones, ur: 20 mg/dL — AB
Leukocytes,Ua: NEGATIVE
Nitrite: NEGATIVE
Protein, ur: NEGATIVE mg/dL
Specific Gravity, Urine: 1.029 (ref 1.005–1.030)
pH: 6 (ref 5.0–8.0)

## 2024-01-09 LAB — VITAMIN D 25 HYDROXY (VIT D DEFICIENCY, FRACTURES): Vit D, 25-Hydroxy: 11.11 ng/mL — ABNORMAL LOW (ref 30–100)

## 2024-01-09 MED ORDER — ALUM & MAG HYDROXIDE-SIMETH 200-200-20 MG/5ML PO SUSP
30.0000 mL | Freq: Four times a day (QID) | ORAL | Status: DC | PRN
  Administered 2024-01-09: 30 mL via ORAL
  Filled 2024-01-09: qty 30

## 2024-01-09 MED ORDER — METHOCARBAMOL 1000 MG/10ML IJ SOLN
1000.0000 mg | Freq: Four times a day (QID) | INTRAMUSCULAR | Status: DC
  Administered 2024-01-09 – 2024-01-13 (×15): 1000 mg via INTRAVENOUS
  Filled 2024-01-09 (×16): qty 10

## 2024-01-09 MED ORDER — CEFAZOLIN SODIUM-DEXTROSE 2-4 GM/100ML-% IV SOLN
2.0000 g | Freq: Three times a day (TID) | INTRAVENOUS | Status: AC
  Administered 2024-01-09 (×3): 2 g via INTRAVENOUS
  Filled 2024-01-09 (×3): qty 100

## 2024-01-09 MED ORDER — TAMSULOSIN HCL 0.4 MG PO CAPS
0.4000 mg | ORAL_CAPSULE | Freq: Every day | ORAL | Status: DC
  Administered 2024-01-10 – 2024-01-15 (×7): 0.4 mg via ORAL
  Filled 2024-01-09 (×7): qty 1

## 2024-01-09 MED ORDER — HYDROMORPHONE HCL 1 MG/ML IJ SOLN
0.5000 mg | INTRAMUSCULAR | Status: DC | PRN
  Administered 2024-01-09 – 2024-01-11 (×9): 1 mg via INTRAVENOUS
  Filled 2024-01-09 (×9): qty 1

## 2024-01-09 MED ORDER — LORAZEPAM 2 MG/ML IJ SOLN
0.5000 mg | Freq: Four times a day (QID) | INTRAMUSCULAR | Status: DC | PRN
  Administered 2024-01-12: 0.5 mg via INTRAVENOUS
  Filled 2024-01-09: qty 1

## 2024-01-09 MED ORDER — HYDROMORPHONE HCL 1 MG/ML IJ SOLN: 0.5000 mg | INTRAMUSCULAR | Status: DC | PRN

## 2024-01-09 MED ORDER — SODIUM CHLORIDE 0.9 % IV SOLN: INTRAVENOUS | Status: AC

## 2024-01-09 MED ORDER — SODIUM CHLORIDE 0.9% IV SOLUTION: Freq: Once | INTRAVENOUS | Status: AC

## 2024-01-09 MED ORDER — ONDANSETRON HCL 4 MG/2ML IJ SOLN
4.0000 mg | Freq: Four times a day (QID) | INTRAMUSCULAR | Status: DC
  Administered 2024-01-09: 4 mg via INTRAVENOUS
  Filled 2024-01-09: qty 2

## 2024-01-09 MED ORDER — ONDANSETRON HCL 4 MG/2ML IJ SOLN
4.0000 mg | INTRAMUSCULAR | Status: DC
  Administered 2024-01-09 – 2024-01-13 (×23): 4 mg via INTRAVENOUS
  Filled 2024-01-09 (×24): qty 2

## 2024-01-09 MED ORDER — SODIUM CHLORIDE 0.9 % IV SOLN
2.0000 g | INTRAVENOUS | Status: AC
  Administered 2024-01-09 – 2024-01-11 (×3): 2 g via INTRAVENOUS
  Filled 2024-01-09 (×3): qty 20

## 2024-01-09 NOTE — Progress Notes (Signed)
 Sinus tachycardia, HR 120s-145, especially when slightly repositioning on the bed. BP remains stable. Pt stated her pain is well controlled tonight. She is able to rest. She requests a staff nurse to please give her PRN pain med q 3-4  hours before having intense pain. Pt has good oral in take on her clear liquid diet. Continue 0.9% NSS 40 ml/hr. Denies nausea/ vomiting or abdominal pain.   Pt is having heavy menses. We concern that her Hb is trending down this am.. Pt has no active bleeding seen from right leg incision. The original compression dressing is dry and clean. We will hand  off to the day shift team.     Latest Reference Range & Units Most Recent 01/07/24 02:56 01/08/24 05:26 01/08/24 20:37 01/09/24 05:19  Hemoglobin 12.0 - 15.0 g/dL 7.1 (L) 89/82/74 94:80 11.2 (L) 9.2 (L) 8.0 (L) 7.1 (L)  (L): Data is abnormally low   Wendi Dash, RN

## 2024-01-09 NOTE — Plan of Care (Signed)
  Problem: Education: Goal: Knowledge of General Education information will improve Description: Including pain rating scale, medication(s)/side effects and non-pharmacologic comfort measures 01/09/2024 2131 by Pearly Doffing, RN Outcome: Progressing 01/09/2024 2131 by Pearly Doffing, RN Outcome: Progressing   Problem: Health Behavior/Discharge Planning: Goal: Ability to manage health-related needs will improve 01/09/2024 2131 by Pearly Doffing, RN Outcome: Progressing 01/09/2024 2131 by Pearly Doffing, RN Outcome: Progressing   Problem: Clinical Measurements: Goal: Ability to maintain clinical measurements within normal limits will improve 01/09/2024 2131 by Pearly Doffing, RN Outcome: Progressing 01/09/2024 2131 by Pearly Doffing, RN Outcome: Progressing Goal: Will remain free from infection 01/09/2024 2131 by Pearly Doffing, RN Outcome: Progressing 01/09/2024 2131 by Pearly Doffing, RN Outcome: Progressing Goal: Diagnostic test results will improve 01/09/2024 2131 by Pearly Doffing, RN Outcome: Progressing 01/09/2024 2131 by Pearly Doffing, RN Outcome: Progressing Goal: Respiratory complications will improve 01/09/2024 2131 by Pearly Doffing, RN Outcome: Progressing 01/09/2024 2131 by Pearly Doffing, RN Outcome: Progressing Goal: Cardiovascular complication will be avoided 01/09/2024 2131 by Pearly Doffing, RN Outcome: Progressing 01/09/2024 2131 by Pearly Doffing, RN Outcome: Progressing   Problem: Coping: Goal: Level of anxiety will decrease 01/09/2024 2131 by Pearly Doffing, RN Outcome: Progressing 01/09/2024 2131 by Pearly Doffing, RN Outcome: Progressing   Problem: Nutrition: Goal: Adequate nutrition will be maintained 01/09/2024 2131 by Pearly Doffing, RN Outcome: Progressing 01/09/2024 2131 by Pearly Doffing, RN Outcome: Progressing   Problem: Elimination: Goal: Will not experience complications related to bowel motility 01/09/2024 2131 by Pearly Doffing, RN Outcome:  Progressing 01/09/2024 2131 by Pearly Doffing, RN Outcome: Progressing Goal: Will not experience complications related to urinary retention 01/09/2024 2131 by Pearly Doffing, RN Outcome: Progressing 01/09/2024 2131 by Pearly Doffing, RN Outcome: Progressing   Problem: Safety: Goal: Ability to remain free from injury will improve 01/09/2024 2131 by Pearly Doffing, RN Outcome: Progressing 01/09/2024 2131 by Pearly Doffing, RN Outcome: Progressing   Problem: Skin Integrity: Goal: Risk for impaired skin integrity will decrease 01/09/2024 2131 by Pearly Doffing, RN Outcome: Progressing 01/09/2024 2131 by Pearly Doffing, RN Outcome: Progressing   Problem: Infection: Goal: Risk for infection will decrease (spleen) 01/09/2024 2131 by Pearly Doffing, RN Outcome: Progressing 01/09/2024 2131 by Pearly Doffing, RN Outcome: Progressing   Problem: Skin Integrity: Goal: Ability to maintain adequate tissue integrity will improve 01/09/2024 2131 by Pearly Doffing, RN Outcome: Progressing 01/09/2024 2131 by Pearly Doffing, RN Outcome: Progressing   Problem: Urinary Elimination: Goal: Ability to achieve a regular elimination pattern will improve 01/09/2024 2131 by Pearly Doffing, RN Outcome: Progressing 01/09/2024 2131 by Pearly Doffing, RN Outcome: Progressing   Problem: Fluid Volume: Goal: Return to normovolemic state will improve 01/09/2024 2131 by Pearly Doffing, RN Outcome: Progressing 01/09/2024 2131 by Pearly Doffing, RN Outcome: Progressing   Problem: Coping: Goal: Exhibits appropriate coping mechanism and reduced anxiety resulting from physical and emotional stress 01/09/2024 2131 by Pearly Doffing, RN Outcome: Progressing 01/09/2024 2131 by Pearly Doffing, RN Outcome: Progressing   Problem: Self-Concept: Goal: Ability to acknowledge body changes will improve 01/09/2024 2131 by Pearly Doffing, RN Outcome: Progressing 01/09/2024 2131 by Pearly Doffing, RN Outcome: Progressing

## 2024-01-09 NOTE — Progress Notes (Signed)
 PT Cancellation Note  Patient Details Name: Kylie [name] MRN: [id number] DOB: [birth year]   Cancelled Treatment:    Reason Eval/Treat Not Completed: (P) Active bedrest order. Coordinated with RN to inquire whether bedrest can be discontinued or not. Will plan to follow-up as able.   Theo Ferretti, PT, DPT Acute Rehabilitation Services  Office: [phone]    [name] 01/09/2024, 7:46 AM

## 2024-01-09 NOTE — Progress Notes (Signed)
Pt off unit for xrays
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
## 2024-01-09 NOTE — Progress Notes (Signed)
Pt return to unit from xray
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
## 2024-01-09 NOTE — Progress Notes (Signed)
 Orthopaedic Progress Note  S: Patient is resting comfortably in bed.  She complains of pain in her right leg but is tolerable for her  O:  Vitals:   01/09/24 0624 01/09/24 0625  BP:    Pulse: (!) 147 (!) 140  Resp: (!) 21 16  Temp:    SpO2: (!) 81% 96%    Clean dressings on the right thigh.  Thigh soft and compressible.  Splint appears on the right lower leg and around the foot.  She has intact sensation in the tibial and peroneal nerve distributions.  3 of 5 strength in EHL FHL.  No pain with passive stretch.   Labs:  Results for orders placed or performed during the hospital encounter of 01/06/24 (from the past 24 hours)  CBC     Status: Abnormal   Collection Time: 01/08/24  8:37 PM  Result Value Ref Range   WBC 8.6 4.0 - 10.5 K/uL   RBC 2.66 (L) 3.87 - 5.11 MIL/uL   Hemoglobin 8.0 (L) 12.0 - 15.0 g/dL   HCT 76.3 (L) 63.9 - 53.9 %   MCV 88.7 80.0 - 100.0 fL   MCH 30.1 26.0 - 34.0 pg   MCHC 33.9 30.0 - 36.0 g/dL   RDW 87.0 88.4 - 84.4 %   Platelets 143 (L) 150 - 400 K/uL   nRBC 0.0 0.0 - 0.2 %  CBC     Status: Abnormal   Collection Time: 01/09/24  5:19 AM  Result Value Ref Range   WBC 6.5 4.0 - 10.5 K/uL   RBC 2.29 (L) 3.87 - 5.11 MIL/uL   Hemoglobin 7.1 (L) 12.0 - 15.0 g/dL   HCT 79.7 (L) 63.9 - 53.9 %   MCV 88.2 80.0 - 100.0 fL   MCH 31.0 26.0 - 34.0 pg   MCHC 35.1 30.0 - 36.0 g/dL   RDW 86.9 88.4 - 84.4 %   Platelets 117 (L) 150 - 400 K/uL   nRBC 0.0 0.0 - 0.2 %    Assessment: Status post intramedullary nail fixation and closed reduction internal fixation of the right talus and closure of open wound with Dr. Celena  The patient is doing well.  She is resting comfortably.  At this point I will defer to Dr. Celena for further management of her right leg wounds.  Please let me know if there is anything else I can do to assist with the care of this patient.     Cordella Rhein, MD, MS [facility] [phone]

## 2024-01-09 NOTE — Progress Notes (Signed)
  Inpatient Rehab Admissions Coordinator :  Per therapy recommendations, patient was screened for CIR candidacy by Ottie Glazier RN MSN.  At this time patient appears to be a potential candidate for CIR. I will place a rehab consult per protocol for full assessment. Please call me with any questions.  Ottie Glazier RN MSN Admissions Coordinator [phone]

## 2024-01-09 NOTE — Anesthesia Postprocedure Evaluation (Signed)
 Anesthesia Post Note  Patient: Kylie [name]  Procedure(s) Performed: OPEN REDUCTION INTERNAL FIXATION (ORIF) CALCANEOUS FRACTURE (Right)     Patient location during evaluation: PACU Anesthesia Type: General Level of consciousness: awake and alert Pain management: pain level controlled Vital Signs Assessment: post-procedure vital signs reviewed and stable Respiratory status: spontaneous breathing, nonlabored ventilation, respiratory function stable and patient connected to nasal cannula oxygen Cardiovascular status: blood pressure returned to baseline and stable Postop Assessment: no apparent nausea or vomiting Anesthetic complications: no   No notable events documented.  Last Vitals:  Vitals:   01/09/24 0947 01/09/24 1124  BP: 133/86 133/83  Pulse: 98 95  Resp: 14 16  Temp: 36.7 C 37.1 C  SpO2: 100% 96%    Last Pain:  Vitals:   01/09/24 1126  TempSrc:   PainSc: 8                  Epifanio Lamar BRAVO

## 2024-01-10 LAB — CBC
HCT: 25.2 % — ABNORMAL LOW (ref 36.0–46.0)
Hemoglobin: 8.6 g/dL — ABNORMAL LOW (ref 12.0–15.0)
MCH: 30.2 pg (ref 26.0–34.0)
MCHC: 34.1 g/dL (ref 30.0–36.0)
MCV: 88.4 fL (ref 80.0–100.0)
Platelets: 151 K/uL (ref 150–400)
RBC: 2.85 MIL/uL — ABNORMAL LOW (ref 3.87–5.11)
RDW: 13.2 % (ref 11.5–15.5)
WBC: 6.9 K/uL (ref 4.0–10.5)
nRBC: 0 % (ref 0.0–0.2)

## 2024-01-10 LAB — TYPE AND SCREEN
ABO/RH(D): O POS
Antibody Screen: NEGATIVE
Unit division: 0

## 2024-01-10 LAB — BASIC METABOLIC PANEL WITH GFR
Anion gap: 10 (ref 5–15)
BUN: 10 mg/dL (ref 6–20)
CO2: 25 mmol/L (ref 22–32)
Calcium: 7.9 mg/dL — ABNORMAL LOW (ref 8.9–10.3)
Chloride: 104 mmol/L (ref 98–111)
Creatinine, Ser: 0.53 mg/dL (ref 0.44–1.00)
GFR, Estimated: >60 mL/min (ref >60)
Glucose, Bld: 83 mg/dL (ref 70–99)
Potassium: 3.4 mmol/L — ABNORMAL LOW (ref 3.5–5.1)
Sodium: 139 mmol/L (ref 135–145)

## 2024-01-10 LAB — BPAM RBC
Blood Product Expiration Date: 202511102359
ISSUE DATE / TIME: 202510170922
Unit Type and Rh: 5100

## 2024-01-10 MED ORDER — ORAL CARE MOUTH RINSE: 15.0000 mL | OROMUCOSAL | Status: DC | PRN

## 2024-01-10 MED ORDER — QUETIAPINE FUMARATE 50 MG PO TABS
50.0000 mg | ORAL_TABLET | Freq: Every evening | ORAL | Status: DC | PRN
  Administered 2024-01-10 – 2024-01-14 (×3): 50 mg via ORAL
  Filled 2024-01-10 (×3): qty 1

## 2024-01-10 MED ORDER — BISACODYL 10 MG RE SUPP
10.0000 mg | Freq: Once | RECTAL | Status: AC
  Administered 2024-01-10: 10 mg via RECTAL
  Filled 2024-01-10: qty 1
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
## 2024-01-10 NOTE — Plan of Care (Signed)
  Problem: Education: Goal: Knowledge of General Education information will improve Description: Including pain rating scale, medication(s)/side effects and non-pharmacologic comfort measures Outcome: Progressing   Problem: Health Behavior/Discharge Planning: Goal: Ability to manage health-related needs will improve Outcome: Progressing   Problem: Clinical Measurements: Goal: Ability to maintain clinical measurements within normal limits will improve Outcome: Progressing Goal: Will remain free from infection Outcome: Progressing Goal: Diagnostic test results will improve Outcome: Progressing Goal: Respiratory complications will improve Outcome: Progressing Goal: Cardiovascular complication will be avoided Outcome: Progressing   Problem: Activity: Goal: Risk for activity intolerance will decrease Outcome: Progressing   Problem: Nutrition: Goal: Adequate nutrition will be maintained Outcome: Progressing   Problem: Coping: Goal: Level of anxiety will decrease Outcome: Progressing   Problem: Elimination: Goal: Will not experience complications related to bowel motility Outcome: Progressing Goal: Will not experience complications related to urinary retention Outcome: Progressing   Problem: Pain Managment: Goal: General experience of comfort will improve and/or be controlled Outcome: Progressing   Problem: Safety: Goal: Ability to remain free from injury will improve Outcome: Progressing   Problem: Skin Integrity: Goal: Risk for impaired skin integrity will decrease Outcome: Progressing   Problem: Activity: Goal: Ability to perform activities at highest level will improve Outcome: Progressing Goal: Ability to avoid complications of mobility impairment will improve Outcome: Progressing Goal: Ability to tolerate increased activity will improve Outcome: Progressing Goal: Will remain free from falls Outcome: Progressing   Problem: Tissue Perfusion: Goal:  Hemodynamically stable with effective tissue perfusion will improve Outcome: Progressing Goal: Postoperative complications will be avoided or minimized Outcome: Progressing   Problem: Skin Integrity: Goal: Ability to maintain adequate tissue integrity will improve Outcome: Progressing   Problem: Urinary Elimination: Goal: Ability to achieve a regular elimination pattern will improve Outcome: Progressing
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
## 2024-01-11 MED ORDER — CALCIUM GLUCONATE-NACL 2-0.675 GM/100ML-% IV SOLN
2.0000 g | Freq: Once | INTRAVENOUS | Status: AC
  Administered 2024-01-11: 2000 mg via INTRAVENOUS
  Filled 2024-01-11: qty 100

## 2024-01-11 MED ORDER — ENOXAPARIN SODIUM 30 MG/0.3ML IJ SOSY
30.0000 mg | PREFILLED_SYRINGE | Freq: Two times a day (BID) | INTRAMUSCULAR | Status: DC
  Administered 2024-01-11 – 2024-01-15 (×9): 30 mg via SUBCUTANEOUS
  Filled 2024-01-11 (×9): qty 0.3

## 2024-01-11 MED ORDER — PROMETHAZINE HCL 25 MG PO TABS
25.0000 mg | ORAL_TABLET | Freq: Three times a day (TID) | ORAL | Status: DC
  Administered 2024-01-11 (×2): 25 mg via ORAL
  Filled 2024-01-11 (×4): qty 1

## 2024-01-11 MED ORDER — ENSURE PLUS HIGH PROTEIN PO LIQD
237.0000 mL | Freq: Two times a day (BID) | ORAL | Status: DC
  Administered 2024-01-11 – 2024-01-15 (×7): 237 mL via ORAL
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
## 2024-01-12 LAB — CBC
HCT: 23.7 % — ABNORMAL LOW (ref 36.0–46.0)
Hemoglobin: 8.2 g/dL — ABNORMAL LOW (ref 12.0–15.0)
MCH: 30.1 pg (ref 26.0–34.0)
MCHC: 34.6 g/dL (ref 30.0–36.0)
MCV: 87.1 fL (ref 80.0–100.0)
Platelets: 173 K/uL (ref 150–400)
RBC: 2.72 MIL/uL — ABNORMAL LOW (ref 3.87–5.11)
RDW: 12.9 % (ref 11.5–15.5)
WBC: 6 K/uL (ref 4.0–10.5)
nRBC: 0.3 % — ABNORMAL HIGH (ref 0.0–0.2)

## 2024-01-12 LAB — BASIC METABOLIC PANEL WITH GFR
Anion gap: 12 (ref 5–15)
BUN: 11 mg/dL (ref 6–20)
CO2: 20 mmol/L — ABNORMAL LOW (ref 22–32)
Calcium: 7.8 mg/dL — ABNORMAL LOW (ref 8.9–10.3)
Chloride: 103 mmol/L (ref 98–111)
Creatinine, Ser: 0.51 mg/dL (ref 0.44–1.00)
GFR, Estimated: >60 mL/min (ref >60)
Glucose, Bld: 102 mg/dL — ABNORMAL HIGH (ref 70–99)
Potassium: 3.8 mmol/L (ref 3.5–5.1)
Sodium: 135 mmol/L (ref 135–145)

## 2024-01-12 MED ORDER — METOPROLOL TARTRATE 5 MG/5ML IV SOLN
5.0000 mg | Freq: Four times a day (QID) | INTRAVENOUS | Status: DC | PRN
  Administered 2024-01-12: 5 mg via INTRAVENOUS
  Filled 2024-01-12: qty 5

## 2024-01-12 MED ORDER — BACITRACIN ZINC 500 UNIT/GM EX OINT
TOPICAL_OINTMENT | Freq: Every day | CUTANEOUS | Status: DC
  Administered 2024-01-12 – 2024-01-15 (×4): 31.5 via TOPICAL
  Filled 2024-01-12: qty 28.4

## 2024-01-12 MED ORDER — HYDROMORPHONE HCL 1 MG/ML IJ SOLN
0.5000 mg | Freq: Four times a day (QID) | INTRAMUSCULAR | Status: DC | PRN
  Administered 2024-01-12 – 2024-01-13 (×2): 0.5 mg via INTRAVENOUS
  Filled 2024-01-12 (×2): qty 0.5

## 2024-01-12 MED ORDER — METOPROLOL TARTRATE 12.5 MG HALF TABLET
12.5000 mg | ORAL_TABLET | Freq: Two times a day (BID) | ORAL | Status: DC
  Administered 2024-01-12 – 2024-01-14 (×5): 12.5 mg via ORAL
  Filled 2024-01-12 (×5): qty 1

## 2024-01-12 MED ORDER — PROMETHAZINE HCL 25 MG PO TABS
25.0000 mg | ORAL_TABLET | Freq: Four times a day (QID) | ORAL | Status: DC
  Administered 2024-01-12 – 2024-01-15 (×13): 25 mg via ORAL
  Filled 2024-01-12 (×14): qty 1

## 2024-01-12 NOTE — Progress Notes (Signed)
..  Trauma Event Note    Reason for Call :  Notified by primary RN of pts HR consistently above 120. She confirms pt has had no hypotension, UOP 450 since 1900, normal mentation. Pt has been medicated for pain and anxiety. Pt did receive 1U PRBC yesterday. Primary RN to expedite getting 5am labs drawn. Discussed with Dr. Ann, agrees with plan to get labs drawn early, no other orders at this time.   Last imported Vital Signs BP (!) 153/76 (BP Location: Left Arm)   Pulse (!) 122   Temp 98.5 F (36.9 C) (Oral)   Resp 17   Ht 5' 10 (1.778 m)   Wt 195 lb 1.7 oz (88.5 kg)   LMP 01/08/2024 (Approximate)   SpO2 93%   BMI 27.99 kg/m   Trending CBC Recent Labs    01/09/24 0519 01/09/24 1431 01/10/24 0521  WBC 6.5 7.0 6.9  HGB 7.1* 8.3* 8.6*  HCT 20.2* 24.8* 25.2*  PLT 117* 133* 151    Trending Coag's No results for input(s): APTT, INR in the last 72 hours.  Trending BMET Recent Labs    01/10/24 0521  NA 139  K 3.4*  CL 104  CO2 25  BUN 10  CREATININE 0.53  GLUCOSE 83      Erek Kowal Dee  Trauma Response RN  Please call TRN at [phone] for further assistance.
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
## 2024-01-12 NOTE — Plan of Care (Signed)
  Problem: Education: Goal: Knowledge of General Education information will improve Description: Including pain rating scale, medication(s)/side effects and non-pharmacologic comfort measures Outcome: Progressing   Problem: Health Behavior/Discharge Planning: Goal: Ability to manage health-related needs will improve Outcome: Progressing   Problem: Clinical Measurements: Goal: Ability to maintain clinical measurements within normal limits will improve Outcome: Progressing Goal: Will remain free from infection Outcome: Progressing Goal: Diagnostic test results will improve Outcome: Progressing Goal: Respiratory complications will improve Outcome: Progressing Goal: Cardiovascular complication will be avoided Outcome: Progressing   Problem: Activity: Goal: Risk for activity intolerance will decrease Outcome: Progressing   Problem: Nutrition: Goal: Adequate nutrition will be maintained Outcome: Progressing   Problem: Coping: Goal: Level of anxiety will decrease Outcome: Progressing   Problem: Elimination: Goal: Will not experience complications related to bowel motility Outcome: Progressing Goal: Will not experience complications related to urinary retention Outcome: Progressing   Problem: Pain Managment: Goal: General experience of comfort will improve and/or be controlled Outcome: Progressing   Problem: Safety: Goal: Ability to remain free from injury will improve Outcome: Progressing   Problem: Skin Integrity: Goal: Risk for impaired skin integrity will decrease Outcome: Progressing   Problem: Activity: Goal: Ability to perform activities at highest level will improve Outcome: Progressing Goal: Ability to avoid complications of mobility impairment will improve Outcome: Progressing Goal: Ability to tolerate increased activity will improve Outcome: Progressing Goal: Will remain free from falls Outcome: Progressing   Problem: Respiratory: Goal: Ability to  maintain adequate ventilation will improve Outcome: Progressing   Problem: Tissue Perfusion: Goal: Hemodynamically stable with effective tissue perfusion will improve Outcome: Progressing Goal: Postoperative complications will be avoided or minimized Outcome: Progressing   Problem: Skin Integrity: Goal: Ability to maintain adequate tissue integrity will improve Outcome: Progressing   Problem: Infection: Goal: Risk for infection will decrease (spleen) Outcome: Progressing   Problem: Bowel/Gastric: Goal: Gastrointestinal status for postoperative course will improve Outcome: Progressing Goal: GI tract motility and GI tissue perfusion will improve Outcome: Progressing Goal: Ability to demonstrate the techniques of an individualized bowel program will improve Outcome: Progressing   Problem: Urinary Elimination: Goal: Ability to achieve a regular elimination pattern will improve Outcome: Progressing   Problem: Fluid Volume: Goal: Return to normovolemic state will improve Outcome: Progressing   Problem: Metabolic: Goal: Ability to maintain a metabolic balance will improve Outcome: Progressing   Problem: Education: Goal: Knowledge of the prescribed therapeutic regimen will improve Outcome: Progressing Goal: Knowledge of injury and care (of blunt abdominal trauma) will improve Outcome: Progressing   Problem: Coping: Goal: Exhibits appropriate coping mechanism and reduced anxiety resulting from physical and emotional stress Outcome: Progressing   Problem: Self-Concept: Goal: Ability to acknowledge body changes will improve Outcome: Progressing
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
## 2024-01-13 ENCOUNTER — Inpatient Hospital Stay (HOSPITAL_COMMUNITY)

## 2024-01-13 LAB — BASIC METABOLIC PANEL WITH GFR
Anion gap: 10 (ref 5–15)
BUN: 9 mg/dL (ref 6–20)
CO2: 24 mmol/L (ref 22–32)
Calcium: 8.1 mg/dL — ABNORMAL LOW (ref 8.9–10.3)
Chloride: 101 mmol/L (ref 98–111)
Creatinine, Ser: 0.53 mg/dL (ref 0.44–1.00)
GFR, Estimated: >60 mL/min (ref >60)
Glucose, Bld: 115 mg/dL — ABNORMAL HIGH (ref 70–99)
Potassium: 3.7 mmol/L (ref 3.5–5.1)
Sodium: 135 mmol/L (ref 135–145)

## 2024-01-13 LAB — CBC
HCT: 25.6 % — ABNORMAL LOW (ref 36.0–46.0)
Hemoglobin: 8.8 g/dL — ABNORMAL LOW (ref 12.0–15.0)
MCH: 30 pg (ref 26.0–34.0)
MCHC: 34.4 g/dL (ref 30.0–36.0)
MCV: 87.4 fL (ref 80.0–100.0)
Platelets: 229 K/uL (ref 150–400)
RBC: 2.93 MIL/uL — ABNORMAL LOW (ref 3.87–5.11)
RDW: 13 % (ref 11.5–15.5)
WBC: 8.1 K/uL (ref 4.0–10.5)
nRBC: 0.2 % (ref 0.0–0.2)

## 2024-01-13 LAB — TROPONIN I (HIGH SENSITIVITY): Troponin I (High Sensitivity): 3 ng/L (ref <18)

## 2024-01-13 MED ORDER — ONDANSETRON HCL 4 MG PO TABS
4.0000 mg | ORAL_TABLET | ORAL | Status: DC
Start: 2024-01-13 — End: 2024-01-15
  Administered 2024-01-13 – 2024-01-15 (×14): 4 mg via ORAL
  Filled 2024-01-13 (×14): qty 1

## 2024-01-13 MED ORDER — HYDROMORPHONE HCL 1 MG/ML IJ SOLN
0.5000 mg | INTRAMUSCULAR | Status: DC | PRN
  Administered 2024-01-13 – 2024-01-15 (×8): 0.5 mg via INTRAVENOUS
  Filled 2024-01-13 (×9): qty 0.5

## 2024-01-13 MED ORDER — FENTANYL CITRATE (PF) 50 MCG/ML IJ SOSY
25.0000 ug | PREFILLED_SYRINGE | Freq: Once | INTRAMUSCULAR | Status: AC
  Administered 2024-01-13: 25 ug via INTRAVENOUS
  Filled 2024-01-13: qty 1

## 2024-01-13 MED ORDER — IOHEXOL 350 MG/ML SOLN
75.0000 mL | Freq: Once | INTRAVENOUS | Status: AC | PRN
  Administered 2024-01-13: 75 mL via INTRAVENOUS

## 2024-01-13 MED ORDER — METHOCARBAMOL 500 MG PO TABS
1000.0000 mg | ORAL_TABLET | Freq: Four times a day (QID) | ORAL | Status: DC
  Administered 2024-01-13 – 2024-01-15 (×9): 1000 mg via ORAL
  Filled 2024-01-13 (×9): qty 2

## 2024-01-13 NOTE — Progress Notes (Signed)
 Burnard Louder, PA notified of Vascular's ultrasound need to remove RLE compression wrap/splint to ultrasound, requiring manipulation of the limb for lower venous duplex. Verbal order from Burnard Louder, PA to hold off on ultrasound. New orders received for CTA.

## 2024-01-13 NOTE — Progress Notes (Signed)
Pt return to unit from CT.
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
## 2024-01-13 NOTE — TOC Progression Note (Signed)
 Transition of Care [facility]) - Progression Note    Patient Details  Name: TEVIS CONGER MRN: [id number] Date of Birth: 1990/09/19  Transition of Care [facility] CM/SW Contact  Jaxston Chohan E Birda Didonato, LCSW Phone Number: 01/13/2024, 10:40 AM  Clinical Narrative:    ITSS screening completed. Patient interested in therapy resources being added to AVS for her to use if needed post DC. Patient denies acute MH needs.  Patient notified that per chart review, insurance shara is pending for CIR. Patient satisfied with this plan.    Expected Discharge Plan: IP Rehab Facility Barriers to Discharge: Continued Medical Work up               Expected Discharge Plan and Services   Discharge Planning Services: CM Consult   Living arrangements for the past 2 months: Mobile Home                                       Social Drivers of Health (SDOH) Interventions SDOH Screenings   Food Insecurity: Patient Declined (01/07/2024)  Housing: Unknown (01/07/2024)  Transportation Needs: Patient Declined (01/07/2024)  Utilities: Patient Declined (01/07/2024)  Depression (PHQ2-9): Low Risk  (11/15/2019)  Financial Resource Strain: Low Risk  (04/19/2020)  Tobacco Use: Medium Risk (01/08/2024)    Readmission Risk Interventions     No data to display

## 2024-01-13 NOTE — Progress Notes (Signed)
   Inpatient Rehabilitation Admissions Coordinator   I await insurance approval to admit to CIR. Met her at bedside and she states she is not feeling well today. Nausea.  Heron Leavell, RN, MSN Rehab Admissions Coordinator [phone] 01/13/2024 1:55 PM
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
## 2024-01-13 NOTE — Progress Notes (Signed)
 1627 Attempted right lower venous duplex. The patient's right lower extremity has a soft cast/wrappings extending from toes to mid thigh. Burnard Louder PA, was contacted regarding the situation. Per PA, the patient will have a CTA chest, and the necessity of the lower venous duplex will be determined based upon the CTA chest results.   01/13/24 4:28 PM Cathlyn Collet RVT
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
## 2024-01-13 NOTE — Progress Notes (Signed)
   01/13/24 1340  Provider Notification  Provider Name/Title Burnard Louder, PA  Date Provider Notified 01/13/24  Time Provider Notified 1340  Method of Notification Page  Notification Reason Other (Comment) (pt c/o new onset left sided chest pain)  Provider response Evaluate remotely;See new orders  Date of Provider Response 01/13/24  Time of Provider Response 1350   Pt reports new onset left sided chest pain that is unchanged with inspiration/expiration. Burnard Louder, PA notified. See new orders.  1430- Burnard Louder, PA at bedside. New orders received.
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
## 2024-01-13 NOTE — Plan of Care (Signed)
  Problem: Education: Goal: Knowledge of General Education information will improve Description: Including pain rating scale, medication(s)/side effects and non-pharmacologic comfort measures Outcome: Progressing   Problem: Health Behavior/Discharge Planning: Goal: Ability to manage health-related needs will improve Outcome: Progressing   Problem: Clinical Measurements: Goal: Ability to maintain clinical measurements within normal limits will improve Outcome: Progressing Goal: Will remain free from infection Outcome: Progressing Goal: Diagnostic test results will improve Outcome: Progressing Goal: Respiratory complications will improve Outcome: Progressing Goal: Cardiovascular complication will be avoided Outcome: Progressing   Problem: Activity: Goal: Risk for activity intolerance will decrease Outcome: Progressing   Problem: Nutrition: Goal: Adequate nutrition will be maintained Outcome: Progressing   Problem: Coping: Goal: Level of anxiety will decrease Outcome: Progressing   Problem: Elimination: Goal: Will not experience complications related to bowel motility Outcome: Progressing Goal: Will not experience complications related to urinary retention Outcome: Progressing   Problem: Pain Managment: Goal: General experience of comfort will improve and/or be controlled Outcome: Progressing   Problem: Safety: Goal: Ability to remain free from injury will improve Outcome: Progressing   Problem: Skin Integrity: Goal: Risk for impaired skin integrity will decrease Outcome: Progressing   Problem: Activity: Goal: Ability to perform activities at highest level will improve Outcome: Progressing Goal: Ability to avoid complications of mobility impairment will improve Outcome: Progressing Goal: Ability to tolerate increased activity will improve Outcome: Progressing Goal: Will remain free from falls Outcome: Progressing   Problem: Respiratory: Goal: Ability to  maintain adequate ventilation will improve Outcome: Progressing   Problem: Tissue Perfusion: Goal: Hemodynamically stable with effective tissue perfusion will improve Outcome: Progressing Goal: Postoperative complications will be avoided or minimized Outcome: Progressing   Problem: Skin Integrity: Goal: Ability to maintain adequate tissue integrity will improve Outcome: Progressing   Problem: Infection: Goal: Risk for infection will decrease (spleen) Outcome: Progressing   Problem: Bowel/Gastric: Goal: Gastrointestinal status for postoperative course will improve Outcome: Progressing Goal: GI tract motility and GI tissue perfusion will improve Outcome: Progressing Goal: Ability to demonstrate the techniques of an individualized bowel program will improve Outcome: Progressing   Problem: Urinary Elimination: Goal: Ability to achieve a regular elimination pattern will improve Outcome: Progressing   Problem: Fluid Volume: Goal: Return to normovolemic state will improve Outcome: Progressing   Problem: Metabolic: Goal: Ability to maintain a metabolic balance will improve Outcome: Progressing   Problem: Education: Goal: Knowledge of the prescribed therapeutic regimen will improve Outcome: Progressing Goal: Knowledge of injury and care (of blunt abdominal trauma) will improve Outcome: Progressing   Problem: Coping: Goal: Exhibits appropriate coping mechanism and reduced anxiety resulting from physical and emotional stress Outcome: Progressing   Problem: Self-Concept: Goal: Ability to acknowledge body changes will improve Outcome: Progressing

## 2024-01-13 NOTE — Progress Notes (Signed)
 Pt off unit to CT

## 2024-01-14 ENCOUNTER — Inpatient Hospital Stay (HOSPITAL_COMMUNITY)

## 2024-01-14 DIAGNOSIS — M79604 Pain in right leg: Secondary | ICD-10-CM | POA: Diagnosis not present

## 2024-01-14 LAB — BASIC METABOLIC PANEL WITH GFR
Anion gap: 10 (ref 5–15)
BUN: 13 mg/dL (ref 6–20)
CO2: 23 mmol/L (ref 22–32)
Calcium: 8.3 mg/dL — ABNORMAL LOW (ref 8.9–10.3)
Chloride: 102 mmol/L (ref 98–111)
Creatinine, Ser: 0.58 mg/dL (ref 0.44–1.00)
GFR, Estimated: >60 mL/min (ref >60)
Glucose, Bld: 118 mg/dL — ABNORMAL HIGH (ref 70–99)
Potassium: 3.8 mmol/L (ref 3.5–5.1)
Sodium: 135 mmol/L (ref 135–145)

## 2024-01-14 LAB — CBC
HCT: 26.3 % — ABNORMAL LOW (ref 36.0–46.0)
Hemoglobin: 8.8 g/dL — ABNORMAL LOW (ref 12.0–15.0)
MCH: 29.6 pg (ref 26.0–34.0)
MCHC: 33.5 g/dL (ref 30.0–36.0)
MCV: 88.6 fL (ref 80.0–100.0)
Platelets: 249 K/uL (ref 150–400)
RBC: 2.97 MIL/uL — ABNORMAL LOW (ref 3.87–5.11)
RDW: 13.2 % (ref 11.5–15.5)
WBC: 7.8 K/uL (ref 4.0–10.5)
nRBC: 0.4 % — ABNORMAL HIGH (ref 0.0–0.2)

## 2024-01-14 MED ORDER — GABAPENTIN 300 MG PO CAPS
300.0000 mg | ORAL_CAPSULE | Freq: Three times a day (TID) | ORAL | Status: DC
  Administered 2024-01-14 – 2024-01-15 (×5): 300 mg via ORAL
  Filled 2024-01-14 (×5): qty 1

## 2024-01-14 MED ORDER — DOCUSATE SODIUM 100 MG PO CAPS
100.0000 mg | ORAL_CAPSULE | Freq: Two times a day (BID) | ORAL | Status: DC
  Administered 2024-01-14 – 2024-01-15 (×3): 100 mg via ORAL
  Filled 2024-01-14 (×3): qty 1

## 2024-01-14 MED ORDER — METOPROLOL TARTRATE 25 MG PO TABS
25.0000 mg | ORAL_TABLET | Freq: Two times a day (BID) | ORAL | Status: DC
  Administered 2024-01-14 – 2024-01-15 (×2): 25 mg via ORAL
  Filled 2024-01-14 (×2): qty 1

## 2024-01-14 MED ORDER — POLYETHYLENE GLYCOL 3350 17 G PO PACK
17.0000 g | PACK | Freq: Two times a day (BID) | ORAL | Status: DC
  Administered 2024-01-14 – 2024-01-15 (×3): 17 g via ORAL
  Filled 2024-01-14 (×2): qty 1

## 2024-01-14 MED ORDER — OXYCODONE HCL 5 MG PO TABS
10.0000 mg | ORAL_TABLET | ORAL | Status: DC | PRN
  Administered 2024-01-14 – 2024-01-15 (×7): 15 mg via ORAL
  Filled 2024-01-14 (×7): qty 3

## 2024-01-14 NOTE — Plan of Care (Signed)
  Problem: Education: Goal: Knowledge of General Education information will improve Description: Including pain rating scale, medication(s)/side effects and non-pharmacologic comfort measures Outcome: Progressing   Problem: Health Behavior/Discharge Planning: Goal: Ability to manage health-related needs will improve Outcome: Progressing   Problem: Clinical Measurements: Goal: Ability to maintain clinical measurements within normal limits will improve Outcome: Progressing Goal: Will remain free from infection Outcome: Progressing Goal: Diagnostic test results will improve Outcome: Progressing Goal: Respiratory complications will improve Outcome: Progressing Goal: Cardiovascular complication will be avoided Outcome: Progressing   Problem: Activity: Goal: Risk for activity intolerance will decrease Outcome: Progressing   Problem: Nutrition: Goal: Adequate nutrition will be maintained Outcome: Progressing   Problem: Coping: Goal: Level of anxiety will decrease Outcome: Progressing   Problem: Elimination: Goal: Will not experience complications related to bowel motility Outcome: Progressing Goal: Will not experience complications related to urinary retention Outcome: Progressing   Problem: Pain Managment: Goal: General experience of comfort will improve and/or be controlled Outcome: Progressing   Problem: Safety: Goal: Ability to remain free from injury will improve Outcome: Progressing   Problem: Skin Integrity: Goal: Risk for impaired skin integrity will decrease Outcome: Progressing   Problem: Activity: Goal: Ability to perform activities at highest level will improve Outcome: Progressing Goal: Ability to avoid complications of mobility impairment will improve Outcome: Progressing Goal: Ability to tolerate increased activity will improve Outcome: Progressing Goal: Will remain free from falls Outcome: Progressing   Problem: Respiratory: Goal: Ability to  maintain adequate ventilation will improve Outcome: Progressing   Problem: Tissue Perfusion: Goal: Hemodynamically stable with effective tissue perfusion will improve Outcome: Progressing Goal: Postoperative complications will be avoided or minimized Outcome: Progressing   Problem: Skin Integrity: Goal: Ability to maintain adequate tissue integrity will improve Outcome: Progressing   Problem: Infection: Goal: Risk for infection will decrease (spleen) Outcome: Progressing   Problem: Bowel/Gastric: Goal: Gastrointestinal status for postoperative course will improve Outcome: Progressing Goal: GI tract motility and GI tissue perfusion will improve Outcome: Progressing Goal: Ability to demonstrate the techniques of an individualized bowel program will improve Outcome: Progressing   Problem: Urinary Elimination: Goal: Ability to achieve a regular elimination pattern will improve Outcome: Progressing   Problem: Fluid Volume: Goal: Return to normovolemic state will improve Outcome: Progressing   Problem: Metabolic: Goal: Ability to maintain a metabolic balance will improve Outcome: Progressing   Problem: Education: Goal: Knowledge of the prescribed therapeutic regimen will improve Outcome: Progressing Goal: Knowledge of injury and care (of blunt abdominal trauma) will improve Outcome: Progressing   Problem: Coping: Goal: Exhibits appropriate coping mechanism and reduced anxiety resulting from physical and emotional stress Outcome: Progressing   Problem: Self-Concept: Goal: Ability to acknowledge body changes will improve Outcome: Progressing
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
## 2024-01-14 NOTE — Progress Notes (Signed)
 RLE venous duplex has been completed.    Results can be found under chart review under CV PROC. 01/14/2024 4:54 PM Majesta Leichter RVT, RDMS

## 2024-01-14 NOTE — Progress Notes (Signed)
 Burnard Louder, PA notified RN that vascular technician can ultrasound right thigh for venous duplex, RN instructed to lower ace wrap to visualize back of knee, but unable to remove splint of leg/ankle per ortho's recommendations (see note). Vascular ultrasound called and updated.
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
## 2024-01-14 NOTE — Progress Notes (Signed)
   Inpatient Rehabilitation Admissions Coordinator   I met with patient at bedside and discussed with Burnard, [place]. I have insurance approval for CIR but patient not medically ready to d/c to CIR today. Hopeful for tomorrow.  Heron Leavell, RN, MSN Rehab Admissions Coordinator [phone] 01/14/2024 12:08 PM

## 2024-01-15 ENCOUNTER — Inpatient Hospital Stay (HOSPITAL_COMMUNITY)
Admission: AD | Admit: 2024-01-15 | Discharge: 2024-01-26 | DRG: 560 | Disposition: A | Discharge status: Home / Self Care | Source: Intra-hospital | Attending: Physical Medicine and Rehabilitation | Admitting: Physical Medicine and Rehabilitation

## 2024-01-15 DIAGNOSIS — K56600 Partial intestinal obstruction, unspecified as to cause: Secondary | ICD-10-CM | POA: Diagnosis present

## 2024-01-15 DIAGNOSIS — I959 Hypotension, unspecified: Secondary | ICD-10-CM | POA: Diagnosis present

## 2024-01-15 DIAGNOSIS — S81812D Laceration without foreign body, left lower leg, subsequent encounter: Secondary | ICD-10-CM

## 2024-01-15 DIAGNOSIS — R52 Pain, unspecified: Secondary | ICD-10-CM

## 2024-01-15 DIAGNOSIS — T1490XA Injury, unspecified, initial encounter: Principal | ICD-10-CM | POA: Diagnosis present

## 2024-01-15 DIAGNOSIS — N939 Abnormal uterine and vaginal bleeding, unspecified: Secondary | ICD-10-CM | POA: Diagnosis present

## 2024-01-15 DIAGNOSIS — S92101D Unspecified fracture of right talus, subsequent encounter for fracture with routine healing: Secondary | ICD-10-CM

## 2024-01-15 DIAGNOSIS — Z808 Family history of malignant neoplasm of other organs or systems: Secondary | ICD-10-CM | POA: Diagnosis not present

## 2024-01-15 DIAGNOSIS — Z79899 Other long term (current) drug therapy: Secondary | ICD-10-CM | POA: Diagnosis not present

## 2024-01-15 DIAGNOSIS — S0081XD Abrasion of other part of head, subsequent encounter: Secondary | ICD-10-CM | POA: Diagnosis not present

## 2024-01-15 DIAGNOSIS — M62838 Other muscle spasm: Secondary | ICD-10-CM | POA: Diagnosis present

## 2024-01-15 DIAGNOSIS — Z833 Family history of diabetes mellitus: Secondary | ICD-10-CM | POA: Diagnosis not present

## 2024-01-15 DIAGNOSIS — F419 Anxiety disorder, unspecified: Secondary | ICD-10-CM | POA: Diagnosis present

## 2024-01-15 DIAGNOSIS — E559 Vitamin D deficiency, unspecified: Secondary | ICD-10-CM | POA: Diagnosis present

## 2024-01-15 DIAGNOSIS — F54 Psychological and behavioral factors associated with disorders or diseases classified elsewhere: Secondary | ICD-10-CM

## 2024-01-15 DIAGNOSIS — Z8249 Family history of ischemic heart disease and other diseases of the circulatory system: Secondary | ICD-10-CM

## 2024-01-15 DIAGNOSIS — S72351D Displaced comminuted fracture of shaft of right femur, subsequent encounter for closed fracture with routine healing: Secondary | ICD-10-CM | POA: Diagnosis present

## 2024-01-15 DIAGNOSIS — S2241XD Multiple fractures of ribs, right side, subsequent encounter for fracture with routine healing: Secondary | ICD-10-CM | POA: Diagnosis not present

## 2024-01-15 DIAGNOSIS — S92001D Unspecified fracture of right calcaneus, subsequent encounter for fracture with routine healing: Secondary | ICD-10-CM

## 2024-01-15 DIAGNOSIS — S81012D Laceration without foreign body, left knee, subsequent encounter: Secondary | ICD-10-CM | POA: Diagnosis not present

## 2024-01-15 DIAGNOSIS — Z8041 Family history of malignant neoplasm of ovary: Secondary | ICD-10-CM

## 2024-01-15 DIAGNOSIS — Z87891 Personal history of nicotine dependence: Secondary | ICD-10-CM

## 2024-01-15 DIAGNOSIS — T07XXXA Unspecified multiple injuries, initial encounter: Secondary | ICD-10-CM

## 2024-01-15 DIAGNOSIS — Z801 Family history of malignant neoplasm of trachea, bronchus and lung: Secondary | ICD-10-CM

## 2024-01-15 DIAGNOSIS — S36039D Unspecified laceration of spleen, subsequent encounter: Secondary | ICD-10-CM

## 2024-01-15 DIAGNOSIS — S36113D Laceration of liver, unspecified degree, subsequent encounter: Secondary | ICD-10-CM

## 2024-01-15 DIAGNOSIS — D62 Acute posthemorrhagic anemia: Secondary | ICD-10-CM | POA: Diagnosis present

## 2024-01-15 DIAGNOSIS — R110 Nausea: Secondary | ICD-10-CM | POA: Diagnosis not present

## 2024-01-15 DIAGNOSIS — Z818 Family history of other mental and behavioral disorders: Secondary | ICD-10-CM

## 2024-01-15 DIAGNOSIS — F32A Depression, unspecified: Secondary | ICD-10-CM | POA: Diagnosis present

## 2024-01-15 DIAGNOSIS — Z820 Family history of epilepsy and other diseases of the nervous system: Secondary | ICD-10-CM | POA: Diagnosis not present

## 2024-01-15 DIAGNOSIS — K5901 Slow transit constipation: Secondary | ICD-10-CM | POA: Diagnosis not present

## 2024-01-15 DIAGNOSIS — Z9181 History of falling: Secondary | ICD-10-CM

## 2024-01-15 LAB — COMPREHENSIVE METABOLIC PANEL WITH GFR
ALT: 67 U/L — ABNORMAL HIGH (ref 0–44)
AST: 46 U/L — ABNORMAL HIGH (ref 15–41)
Albumin: 2.7 g/dL — ABNORMAL LOW (ref 3.5–5.0)
Alkaline Phosphatase: 161 U/L — ABNORMAL HIGH (ref 38–126)
Anion gap: 10 (ref 5–15)
BUN: 12 mg/dL (ref 6–20)
CO2: 24 mmol/L (ref 22–32)
Calcium: 8.5 mg/dL — ABNORMAL LOW (ref 8.9–10.3)
Chloride: 101 mmol/L (ref 98–111)
Creatinine, Ser: 0.54 mg/dL (ref 0.44–1.00)
GFR, Estimated: >60 mL/min (ref >60)
Glucose, Bld: 120 mg/dL — ABNORMAL HIGH (ref 70–99)
Potassium: 4 mmol/L (ref 3.5–5.1)
Sodium: 135 mmol/L (ref 135–145)
Total Bilirubin: 1.1 mg/dL (ref 0.0–1.2)
Total Protein: 6.6 g/dL (ref 6.5–8.1)

## 2024-01-15 LAB — CBC WITH DIFFERENTIAL/PLATELET
Abs Immature Granulocytes: 0.25 K/uL — ABNORMAL HIGH (ref 0.00–0.07)
Basophils Absolute: 0 K/uL (ref 0.0–0.1)
Basophils Relative: 1 %
Eosinophils Absolute: 0.1 K/uL (ref 0.0–0.5)
Eosinophils Relative: 1 %
HCT: 26.5 % — ABNORMAL LOW (ref 36.0–46.0)
Hemoglobin: 8.7 g/dL — ABNORMAL LOW (ref 12.0–15.0)
Immature Granulocytes: 3 %
Lymphocytes Relative: 18 %
Lymphs Abs: 1.3 K/uL (ref 0.7–4.0)
MCH: 29.4 pg (ref 26.0–34.0)
MCHC: 32.8 g/dL (ref 30.0–36.0)
MCV: 89.5 fL (ref 80.0–100.0)
Monocytes Absolute: 0.9 K/uL (ref 0.1–1.0)
Monocytes Relative: 12 %
Neutro Abs: 4.9 K/uL (ref 1.7–7.7)
Neutrophils Relative %: 65 %
Platelets: 263 K/uL (ref 150–400)
RBC: 2.96 MIL/uL — ABNORMAL LOW (ref 3.87–5.11)
RDW: 13.2 % (ref 11.5–15.5)
WBC: 7.5 K/uL (ref 4.0–10.5)
nRBC: 0.5 % — ABNORMAL HIGH (ref 0.0–0.2)

## 2024-01-15 LAB — BASIC METABOLIC PANEL WITH GFR
Anion gap: 11 (ref 5–15)
BUN: 14 mg/dL (ref 6–20)
CO2: 25 mmol/L (ref 22–32)
Calcium: 8.8 mg/dL — ABNORMAL LOW (ref 8.9–10.3)
Chloride: 100 mmol/L (ref 98–111)
Creatinine, Ser: 0.46 mg/dL (ref 0.44–1.00)
GFR, Estimated: >60 mL/min (ref >60)
Glucose, Bld: 109 mg/dL — ABNORMAL HIGH (ref 70–99)
Potassium: 4.3 mmol/L (ref 3.5–5.1)
Sodium: 136 mmol/L (ref 135–145)

## 2024-01-15 LAB — CBC
HCT: 27.7 % — ABNORMAL LOW (ref 36.0–46.0)
Hemoglobin: 9.2 g/dL — ABNORMAL LOW (ref 12.0–15.0)
MCH: 29.7 pg (ref 26.0–34.0)
MCHC: 33.2 g/dL (ref 30.0–36.0)
MCV: 89.4 fL (ref 80.0–100.0)
Platelets: 262 K/uL (ref 150–400)
RBC: 3.1 MIL/uL — ABNORMAL LOW (ref 3.87–5.11)
RDW: 13.3 % (ref 11.5–15.5)
WBC: 7.9 K/uL (ref 4.0–10.5)
nRBC: 0.4 % — ABNORMAL HIGH (ref 0.0–0.2)

## 2024-01-15 MED ORDER — METOPROLOL TARTRATE 25 MG PO TABS
25.0000 mg | ORAL_TABLET | Freq: Two times a day (BID) | ORAL | Status: DC
  Administered 2024-01-15 – 2024-01-16 (×2): 25 mg via ORAL
  Filled 2024-01-15 (×2): qty 1

## 2024-01-15 MED ORDER — POLYETHYLENE GLYCOL 3350 17 G PO PACK
17.0000 g | PACK | Freq: Once | ORAL | Status: DC
Start: 2024-01-15 — End: 2024-01-15

## 2024-01-15 MED ORDER — SENNA 8.6 MG PO TABS
2.0000 | ORAL_TABLET | Freq: Every day | ORAL | Status: DC
  Administered 2024-01-15: 17.2 mg via ORAL
  Filled 2024-01-15: qty 2

## 2024-01-15 MED ORDER — PROCHLORPERAZINE MALEATE 5 MG PO TABS
5.0000 mg | ORAL_TABLET | Freq: Four times a day (QID) | ORAL | Status: DC | PRN
  Administered 2024-01-16 – 2024-01-21 (×3): 10 mg via ORAL
  Administered 2024-01-22: 5 mg via ORAL
  Administered 2024-01-22 – 2024-01-23 (×2): 10 mg via ORAL
  Filled 2024-01-15 (×5): qty 2
  Filled 2024-01-15: qty 1

## 2024-01-15 MED ORDER — ALUM & MAG HYDROXIDE-SIMETH 200-200-20 MG/5ML PO SUSP
30.0000 mL | ORAL | Status: DC | PRN
  Administered 2024-01-21 – 2024-01-22 (×2): 30 mL via ORAL
  Filled 2024-01-15 (×2): qty 30

## 2024-01-15 MED ORDER — PROCHLORPERAZINE 25 MG RE SUPP: 12.5000 mg | Freq: Four times a day (QID) | RECTAL | Status: DC | PRN

## 2024-01-15 MED ORDER — DIPHENHYDRAMINE HCL 25 MG PO CAPS: 25.0000 mg | ORAL_CAPSULE | Freq: Four times a day (QID) | ORAL | Status: DC | PRN

## 2024-01-15 MED ORDER — ACETAMINOPHEN 160 MG/5ML PO SOLN
500.0000 mg | Freq: Three times a day (TID) | ORAL | Status: DC
  Administered 2024-01-15 – 2024-01-16 (×2): 500 mg via ORAL
  Filled 2024-01-15 (×4): qty 20.3

## 2024-01-15 MED ORDER — LIDOCAINE 5 % EX PTCH
2.0000 | MEDICATED_PATCH | CUTANEOUS | Status: DC
  Administered 2024-01-16 – 2024-01-20 (×5): 2 via TRANSDERMAL
  Filled 2024-01-15 (×7): qty 2

## 2024-01-15 MED ORDER — BACITRACIN ZINC 500 UNIT/GM EX OINT
TOPICAL_OINTMENT | Freq: Every day | CUTANEOUS | Status: DC
  Administered 2024-01-16 – 2024-01-19 (×4): 31.5 via TOPICAL
  Filled 2024-01-15: qty 28.4

## 2024-01-15 MED ORDER — HYDROMORPHONE HCL 1 MG/ML IJ SOLN
0.5000 mg | Freq: Three times a day (TID) | INTRAMUSCULAR | Status: DC | PRN
  Administered 2024-01-16 (×2): 0.5 mg via INTRAVENOUS
  Filled 2024-01-15 (×2): qty 1

## 2024-01-15 MED ORDER — POLYETHYLENE GLYCOL 3350 17 G PO PACK
17.0000 g | PACK | Freq: Two times a day (BID) | ORAL | Status: DC
  Administered 2024-01-17 – 2024-01-19 (×4): 17 g via ORAL
  Filled 2024-01-15 (×10): qty 1

## 2024-01-15 MED ORDER — LIDOCAINE HCL URETHRAL/MUCOSAL 2 % EX GEL: CUTANEOUS | Status: DC | PRN

## 2024-01-15 MED ORDER — METHOCARBAMOL 500 MG PO TABS
1000.0000 mg | ORAL_TABLET | Freq: Four times a day (QID) | ORAL | Status: DC
  Administered 2024-01-15 – 2024-01-16 (×3): 1000 mg via ORAL
  Filled 2024-01-15 (×3): qty 2

## 2024-01-15 MED ORDER — FLEET ENEMA RE ENEM: 1.0000 | ENEMA | Freq: Once | RECTAL | Status: DC | PRN

## 2024-01-15 MED ORDER — ACETAMINOPHEN 500 MG PO TABS: 1000.0000 mg | ORAL_TABLET | Freq: Three times a day (TID) | ORAL | Status: DC

## 2024-01-15 MED ORDER — ONDANSETRON HCL 4 MG PO TABS
4.0000 mg | ORAL_TABLET | Freq: Four times a day (QID) | ORAL | Status: DC
Start: 2024-01-16 — End: 2024-01-18
  Administered 2024-01-16 – 2024-01-18 (×10): 4 mg via ORAL
  Filled 2024-01-15 (×10): qty 1

## 2024-01-15 MED ORDER — PROCHLORPERAZINE EDISYLATE 10 MG/2ML IJ SOLN
5.0000 mg | Freq: Four times a day (QID) | INTRAMUSCULAR | Status: DC | PRN
  Administered 2024-01-15 – 2024-01-20 (×4): 10 mg via INTRAVENOUS
  Filled 2024-01-15 (×4): qty 2

## 2024-01-15 MED ORDER — TAMSULOSIN HCL 0.4 MG PO CAPS
0.4000 mg | ORAL_CAPSULE | Freq: Every day | ORAL | Status: DC
  Administered 2024-01-16 – 2024-01-18 (×3): 0.4 mg via ORAL
  Filled 2024-01-15 (×3): qty 1

## 2024-01-15 MED ORDER — VITAMIN D (ERGOCALCIFEROL) 1.25 MG (50000 UNIT) PO CAPS: 50000.0000 [IU] | ORAL_CAPSULE | ORAL | Status: DC

## 2024-01-15 MED ORDER — OXYCODONE HCL 5 MG PO TABS
10.0000 mg | ORAL_TABLET | ORAL | Status: DC | PRN
  Administered 2024-01-15 – 2024-01-19 (×17): 15 mg via ORAL
  Filled 2024-01-15 (×17): qty 3

## 2024-01-15 MED ORDER — GUAIFENESIN-DM 100-10 MG/5ML PO SYRP: 5.0000 mL | ORAL_SOLUTION | Freq: Four times a day (QID) | ORAL | Status: DC | PRN

## 2024-01-15 MED ORDER — SENNOSIDES-DOCUSATE SODIUM 8.6-50 MG PO TABS
2.0000 | ORAL_TABLET | Freq: Every day | ORAL | Status: DC
Start: 2024-01-16 — End: 2024-01-20
  Administered 2024-01-16 – 2024-01-20 (×5): 2 via ORAL
  Filled 2024-01-15 (×5): qty 2

## 2024-01-15 MED ORDER — ENOXAPARIN SODIUM 30 MG/0.3ML IJ SOSY
30.0000 mg | PREFILLED_SYRINGE | Freq: Two times a day (BID) | INTRAMUSCULAR | Status: DC
  Administered 2024-01-15 – 2024-01-19 (×8): 30 mg via SUBCUTANEOUS
  Filled 2024-01-15 (×8): qty 0.3

## 2024-01-15 MED ORDER — ORAL CARE MOUTH RINSE: 15.0000 mL | OROMUCOSAL | Status: DC | PRN

## 2024-01-15 MED ORDER — ENSURE PLUS HIGH PROTEIN PO LIQD
237.0000 mL | Freq: Two times a day (BID) | ORAL | Status: DC
  Administered 2024-01-16 – 2024-01-17 (×4): 237 mL via ORAL

## 2024-01-15 MED ORDER — GABAPENTIN 300 MG PO CAPS
300.0000 mg | ORAL_CAPSULE | Freq: Three times a day (TID) | ORAL | Status: DC
Start: 2024-01-15 — End: 2024-01-22
  Administered 2024-01-15 – 2024-01-22 (×20): 300 mg via ORAL
  Filled 2024-01-15 (×20): qty 1

## 2024-01-15 MED ORDER — SORBITOL 70 % SOLN: 60.0000 mL | Freq: Every day | Status: DC | PRN

## 2024-01-15 MED ORDER — QUETIAPINE FUMARATE 50 MG PO TABS
50.0000 mg | ORAL_TABLET | Freq: Every evening | ORAL | Status: DC | PRN
  Administered 2024-01-17 – 2024-01-18 (×2): 50 mg via ORAL
  Filled 2024-01-15 (×2): qty 1
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
## 2024-01-15 NOTE — TOC Transition Note (Signed)
 Transition of Care [facility]) - Discharge Note   Patient Details  Name: Kylie [name] MRN: [id number] Date of Birth: 15-Feb-1991  Transition of Care [facility]) CM/SW Contact:  Geovannie Vilar E Faizah Kandler, LCSW Phone Number: 01/15/2024, 10:38 AM   Clinical Narrative:    Patient is discharging to [facility] today.   Final next level of care: IP Rehab Facility Barriers to Discharge: Barriers Resolved   Patient Goals and CMS Choice            Discharge Placement                       Discharge Plan and Services Additional resources added to the After Visit Summary for     Discharge Planning Services: CM Consult                                 Social Drivers of Health (SDOH) Interventions SDOH Screenings   Food Insecurity: Patient Declined (01/07/2024)  Housing: Unknown (01/07/2024)  Transportation Needs: Patient Declined (01/07/2024)  Utilities: Patient Declined (01/07/2024)  Depression (PHQ2-9): Low Risk  (11/15/2019)  Financial Resource Strain: Low Risk  (04/19/2020)  Tobacco Use: Medium Risk (01/08/2024)     Readmission Risk Interventions     No data to display

## 2024-01-15 NOTE — Progress Notes (Signed)
  Inpatient Rehabilitation Admissions Coordinator   Met with patient at bedside. She is cleared by Trauma for admit. She is in agreement. Acute team and TOC made aware. I will make the arrangements to admit today.  Heron Leavell, RN, MSN Rehab Admissions Coordinator [phone] 01/15/2024 10:32 AM
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
## 2024-01-16 ENCOUNTER — Encounter (HOSPITAL_COMMUNITY): Payer: Self-pay | Admitting: Physical Medicine and Rehabilitation

## 2024-01-16 ENCOUNTER — Other Ambulatory Visit: Payer: Self-pay

## 2024-01-16 ENCOUNTER — Inpatient Hospital Stay (HOSPITAL_COMMUNITY)

## 2024-01-16 DIAGNOSIS — T1490XA Injury, unspecified, initial encounter: Secondary | ICD-10-CM | POA: Diagnosis not present

## 2024-01-16 MED ORDER — METOPROLOL TARTRATE 50 MG PO TABS
50.0000 mg | ORAL_TABLET | Freq: Two times a day (BID) | ORAL | Status: DC
  Administered 2024-01-16 – 2024-01-21 (×10): 50 mg via ORAL
  Filled 2024-01-16 (×10): qty 1

## 2024-01-16 MED ORDER — MAGNESIUM GLUCONATE 500 (27 MG) MG PO TABS
250.0000 mg | ORAL_TABLET | Freq: Every day | ORAL | Status: DC
  Administered 2024-01-16 – 2024-01-25 (×10): 250 mg via ORAL
  Filled 2024-01-16 (×10): qty 1

## 2024-01-16 MED ORDER — TIZANIDINE HCL 4 MG PO TABS
2.0000 mg | ORAL_TABLET | Freq: Three times a day (TID) | ORAL | Status: DC | PRN
  Administered 2024-01-16 – 2024-01-22 (×9): 2 mg via ORAL
  Filled 2024-01-16 (×9): qty 1

## 2024-01-16 MED ORDER — METHOCARBAMOL 500 MG PO TABS
1000.0000 mg | ORAL_TABLET | Freq: Three times a day (TID) | ORAL | Status: DC
Start: 2024-01-16 — End: 2024-01-22
  Administered 2024-01-16 – 2024-01-22 (×18): 1000 mg via ORAL
  Filled 2024-01-16 (×18): qty 2

## 2024-01-16 MED ORDER — HYDROMORPHONE HCL 1 MG/ML IJ SOLN
1.0000 mg | Freq: Three times a day (TID) | INTRAMUSCULAR | Status: DC | PRN
  Administered 2024-01-16 – 2024-01-20 (×12): 1 mg via INTRAVENOUS
  Filled 2024-01-16 (×12): qty 1

## 2024-01-16 MED ORDER — VITAMIN D 25 MCG (1000 UNIT) PO TABS
1000.0000 [IU] | ORAL_TABLET | Freq: Every day | ORAL | Status: DC
Start: 2024-01-16 — End: 2024-01-19
  Administered 2024-01-16 – 2024-01-19 (×4): 1000 [IU] via ORAL
  Filled 2024-01-16 (×4): qty 1

## 2024-01-16 NOTE — Progress Notes (Signed)
 Inpatient Rehabilitation Admission Medication Review by a Pharmacist  A complete drug regimen review was completed for this patient to identify any potential clinically significant medication issues.  High Risk Drug Classes Is patient taking? Indication by Medication  Antipsychotic Yes Compazine-N/V Seroquel-sleep  Anticoagulant Yes Enoxaparin-VTE px  Antibiotic No   Opioid Yes Hydromorphone -severe pain Percocet-Moderate to severe pain  Antiplatelet No   Hypoglycemics/insulin No   Vasoactive Medication Yes Metoprolol-HTN Flomax-urinary retention  Chemotherapy No   Other Yes Acetaminophen -pain Maalox-GERD Benadryl -itching Gabapentin-nerve pain Guaifenesin/DM-cough Lidoderm -Pain Methocarbamol-spasms Zofran -N/V Miralax, Sorbitol, fleets, Senna S-constipation Ergocalciferol-supplementation     Type of Medication Issue Identified Description of Issue Recommendation(s)  Drug Interaction(s) (clinically significant)     Duplicate Therapy     Allergy     No Medication Administration End Date     Incorrect Dose     Additional Drug Therapy Needed     Significant med changes from prior encounter (inform family/care partners about these prior to discharge). Patient on propranolol prn for anxiety, but lopressor scheduled on acute/CIR Please consider d/c of propranolol if patient d/c'd on metoprolol  Other       Clinically significant medication issues were identified that warrant physician communication and completion of prescribed/recommended actions by midnight of the next day:  No  Name of provider notified for urgent issues identified:   Provider Method of Notification:     Pharmacist comments:   Time spent performing this drug regimen review (minutes):  20  Pearly Apachito A. Lyle, PharmD, BCPS, FNKF Clinical Pharmacist [place] Please utilize Amion for appropriate phone number to reach the unit pharmacist [facility])  01/16/2024 8:35 AM
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
## 2024-01-16 NOTE — Progress Notes (Signed)
 Inpatient Rehabilitation Center Individual Statement of Services  Patient Name:  Kylie [name]  Date:  01/16/2024  Welcome to the Inpatient Rehabilitation Center.  Our goal is to provide you with an individualized program based on your diagnosis and situation, designed to meet your specific needs.  With this comprehensive rehabilitation program, you will be expected to participate in at least 3 hours of rehabilitation therapies Monday-Friday, with modified therapy programming on the weekends.  Your rehabilitation program will include the following services:  Physical Therapy (PT), Occupational Therapy (OT), Speech Therapy (ST), 24 hour per day rehabilitation nursing, Therapeutic Recreaction (TR), Neuropsychology, Care Coordinator, Rehabilitation Medicine, Nutrition Services, and Pharmacy Services  Weekly team conferences will be held on Wednesday to discuss your progress.  Your Inpatient Rehabilitation Care Coordinator will talk with you frequently to get your input and to update you on team discussions.  Team conferences with you and your family in attendance may also be held.  Expected length of stay: 12-14 days   Overall anticipated outcome: Independent with assistive device    Depending on your progress and recovery, your program may change. Your Inpatient Rehabilitation Care Coordinator will coordinate services and will keep you informed of any changes. Your Inpatient Rehabilitation Care Coordinator's name and contact numbers are listed  below.  The following services may also be recommended but are not provided by the Inpatient Rehabilitation Center:  Driving Evaluations Home Health Rehabiltiation Services Outpatient Rehabilitation Services Vocational Rehabilitation   Arrangements will be made to provide these services after discharge if needed.  Arrangements include referral to agencies that provide these services.  Your insurance has been verified to be: [place] MEDICAID PREPAID HEALTH  PLAN / [place] MEDICAID HEALTHY BLUE  Your primary doctor is:  [facility], [facility]  Pertinent information will be shared with your doctor and your insurance company.  Inpatient Rehabilitation Care Coordinator:  [name] SIERRAS [phone] or ELIGAH BRINKS  Information discussed with and copy given to patient by: Waverly Loreli, 01/16/2024, 2:27 PM
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
## 2024-01-16 NOTE — Progress Notes (Signed)
 Inpatient Rehabilitation  Patient information reviewed and entered into eRehab system by [facility]. Karen Kays., CCC/SLP, PPS Coordinator.  Information including medical coding, functional ability and quality indicators will be reviewed and updated through discharge.
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
## 2024-01-16 NOTE — Plan of Care (Signed)
 Problem: RH Balance Goal: LTG: Patient will maintain dynamic sitting balance (OT) Description: LTG:  Patient will maintain dynamic sitting balance with assistance during activities of daily living (OT) Flowsheets (Taken 01/16/2024 1725) LTG: Pt will maintain dynamic sitting balance during ADLs with: Independent with assistive device   Problem: Sit to Stand Goal: LTG:  Patient will perform sit to stand in prep for activites of daily living with assistance level (OT) Description: LTG:  Patient will perform sit to stand in prep for activites of daily living with assistance level (OT) Flowsheets (Taken 01/16/2024 1725) LTG: PT will perform sit to stand in prep for activites of daily living with assistance level: Independent with assistive device   Problem: RH Eating Goal: LTG Patient will perform eating w/assist, cues/equip (OT) Description: LTG: Patient will perform eating with assist, with/without cues using equipment (OT) Flowsheets (Taken 01/16/2024 1725) LTG: Pt will perform eating with assistance level of: Independent with assistive device    Problem: RH Grooming Goal: LTG Patient will perform grooming w/assist,cues/equip (OT) Description: LTG: Patient will perform grooming with assist, with/without cues using equipment (OT) Flowsheets (Taken 01/16/2024 1725) LTG: Pt will perform grooming with assistance level of: Independent with assistive device    Problem: RH Bathing Goal: LTG Patient will bathe all body parts with assist levels (OT) Description: LTG: Patient will bathe all body parts with assist levels (OT) Flowsheets (Taken 01/16/2024 1725) LTG: Pt will perform bathing with assistance level/cueing: Independent with assistive device    Problem: RH Dressing Goal: LTG Patient will perform upper body dressing (OT) Description: LTG Patient will perform upper body dressing with assist, with/without cues (OT). Flowsheets (Taken 01/16/2024 1725) LTG: Pt will perform upper body  dressing with assistance level of: Independent with assistive device Goal: LTG Patient will perform lower body dressing w/assist (OT) Description: LTG: Patient will perform lower body dressing with assist, with/without cues in positioning using equipment (OT) Flowsheets (Taken 01/16/2024 1725) LTG: Pt will perform lower body dressing with assistance level of: Independent with assistive device   Problem: RH Toileting Goal: LTG Patient will perform toileting task (3/3 steps) with assistance level (OT) Description: LTG: Patient will perform toileting task (3/3 steps) with assistance level (OT)  Flowsheets (Taken 01/16/2024 1725) LTG: Pt will perform toileting task (3/3 steps) with assistance level: Independent with assistive device   Problem: RH Simple Meal Prep Goal: LTG Patient will perform simple meal prep w/assist (OT) Description: LTG: Patient will perform simple meal prep with assistance, with/without cues (OT). Flowsheets (Taken 01/16/2024 1725) LTG: Pt will perform simple meal prep with assistance level of: Independent with assistive device   Problem: RH Laundry Goal: LTG Patient will perform laundry w/assist, cues (OT) Description: LTG: Patient will perform laundry with assistance, with/without cues (OT). Flowsheets (Taken 01/16/2024 1725) LTG: Pt will perform laundry with assistance level of: Independent with assistive device   Problem: RH Light Housekeeping Goal: LTG Patient will perform light housekeeping w/assist (OT) Description: LTG: Patient will perform light housekeeping with assistance, with/without cues (OT). Flowsheets (Taken 01/16/2024 1725) LTG: Pt will perform light housekeeping with assistance level of: Independent with assistive device   Problem: RH Toilet Transfers Goal: LTG Patient will perform toilet transfers w/assist (OT) Description: LTG: Patient will perform toilet transfers with assist, with/without cues using equipment (OT) Flowsheets (Taken 01/16/2024  1725) LTG: Pt will perform toilet transfers with assistance level of: Independent with assistive device   Problem: RH Tub/Shower Transfers Goal: LTG Patient will perform tub/shower transfers w/assist (OT) Description: LTG: Patient will  perform tub/shower transfers with assist, with/without cues using equipment (OT) Flowsheets (Taken 01/16/2024 1725) LTG: Pt will perform tub/shower stall transfers with assistance level of: Independent with assistive device
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
## 2024-01-16 NOTE — Progress Notes (Signed)
 Physical Therapy Session Note  Patient Details  Name: Kylie [name] MRN: [id number] Date of Birth: 1990-10-23  Today's Date: 01/16/2024 PT Individual Time: 1400-1453 PT Individual Time Calculation (min): 53 min  Today's Date: 01/16/2024 PT Missed Time: 7 Minutes Missed Time Reason: Pain  Short Term Goals: Week 1:  PT Short Term Goal 1 (Week 1): pt will perform bed mobility with min A PT Short Term Goal 2 (Week 1): pt will perform all transfers with LRAD and CGA PT Short Term Goal 3 (Week 1): pt will ambulate 15ft with LRAD and CGA  Skilled Therapeutic Interventions/Progress Updates:   Received pt semi-reclined in bed, pt agreeable to PT treatment, and reported pain 8/10 in RLE - RN notified and present to administer medication. Session with emphasis on functional mobility/transfers, generalized strengthening and endurance, and dynamic standing balance/coordination. Pt transferred semi-reclined<>sitting L EOB with HOB elevated and use of bedrails with mod A for RLE management. Donned 2nd gown and L slipper and performed all transfers with RW and min A throughout session.   Pt transported to/from room in [facility] dependently (declined propelling). Built up R elevating legrests with foam, towels, and coband for comfort. Pt politely refused any standing/ambulating due to pain and requested to work on seated activities. Pt performed LLE strengthening on Kinetron at 10 cm/sec for 3 minutes with therapist providing manual counter resistance with emphasis on glute/quad strength while discussing DME for discharge - pt will need 18x18 manual WC with R elevating legrest, plan to get RW from her dad.   Transported to main gym and discussed stair navigation - pt reports having 5 STE with 2 handrails. Demonstrated technique for hopping up/down steps, shower chair technique, bumping up/down in WC, and discussed getting ramp. Pt prefers to have her dad and brother bump her up/down steps in [facility] - provided pt with  handout. Pt declined practicing stair navigation and insisted on returning to room. Transferred back to bed and transitioned into supine with mod A for RLE management. Concluded session with pt semi-reclined in bed, needs within reach, and bed alarm on with mom at bedside. Provided pt with ice pack for RLE and heat for R ribs for pain relief. 7 minutes missed of skilled physical therapy due to pain.   Therapy Documentation Precautions:  Precautions Precautions: Fall Required Braces or Orthoses: Splint/Cast Splint/Cast: R ankle cast/ace Splint/Cast - Date Prophylactic Dressing Applied (if applicable): 01/08/24 Restrictions Weight Bearing Restrictions Per Provider Order: Yes RLE Weight Bearing Per Provider Order: Non weight bearing Other Position/Activity Restrictions: NO ROM to R hip/knee  Therapy/Group: Individual Therapy Therisa HERO Zaunegger Therisa Stains PT, DPT 01/16/2024, 7:04 AM

## 2024-01-17 DIAGNOSIS — D62 Acute posthemorrhagic anemia: Secondary | ICD-10-CM | POA: Diagnosis not present

## 2024-01-17 DIAGNOSIS — T1490XA Injury, unspecified, initial encounter: Secondary | ICD-10-CM | POA: Diagnosis not present

## 2024-01-17 DIAGNOSIS — K5901 Slow transit constipation: Secondary | ICD-10-CM

## 2024-01-17 LAB — CBC WITH DIFFERENTIAL/PLATELET
Abs Immature Granulocytes: 0.27 K/uL — ABNORMAL HIGH (ref 0.00–0.07)
Basophils Absolute: 0.1 K/uL (ref 0.0–0.1)
Basophils Relative: 1 %
Eosinophils Absolute: 0.1 K/uL (ref 0.0–0.5)
Eosinophils Relative: 1 %
HCT: 26.3 % — ABNORMAL LOW (ref 36.0–46.0)
Hemoglobin: 8.7 g/dL — ABNORMAL LOW (ref 12.0–15.0)
Immature Granulocytes: 4 %
Lymphocytes Relative: 21 %
Lymphs Abs: 1.4 K/uL (ref 0.7–4.0)
MCH: 29.6 pg (ref 26.0–34.0)
MCHC: 33.1 g/dL (ref 30.0–36.0)
MCV: 89.5 fL (ref 80.0–100.0)
Monocytes Absolute: 0.7 K/uL (ref 0.1–1.0)
Monocytes Relative: 10 %
Neutro Abs: 4.2 K/uL (ref 1.7–7.7)
Neutrophils Relative %: 63 %
Platelets: 257 K/uL (ref 150–400)
RBC: 2.94 MIL/uL — ABNORMAL LOW (ref 3.87–5.11)
RDW: 13.4 % (ref 11.5–15.5)
WBC: 6.6 K/uL (ref 4.0–10.5)
nRBC: 0.5 % — ABNORMAL HIGH (ref 0.0–0.2)

## 2024-01-17 MED ORDER — MELATONIN 5 MG PO TABS
5.0000 mg | ORAL_TABLET | Freq: Every day | ORAL | Status: DC
  Administered 2024-01-17 – 2024-01-21 (×5): 5 mg via ORAL
  Filled 2024-01-17 (×5): qty 1
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
## 2024-01-17 NOTE — Plan of Care (Signed)
  Problem: Consults Goal: RH GENERAL PATIENT EDUCATION Description: See Patient Education module for education specifics. Outcome: Progressing   Problem: RH BOWEL ELIMINATION Goal: RH STG MANAGE BOWEL WITH ASSISTANCE Description: STG Manage Bowel with mod I Assistance. Outcome: Progressing Goal: RH STG MANAGE BOWEL W/MEDICATION W/ASSISTANCE Description: STG Manage Bowel with Medication with mod I  Assistance. Outcome: Progressing   Problem: RH BLADDER ELIMINATION Goal: RH STG MANAGE BLADDER WITH ASSISTANCE Description: STG Manage Bladder With mod I Assistance Outcome: Progressing Goal: RH STG MANAGE BLADDER WITH MEDICATION WITH ASSISTANCE Description: STG Manage Bladder With Medication With Assistance. Outcome: Progressing   Problem: RH SAFETY Goal: RH STG ADHERE TO SAFETY PRECAUTIONS W/ASSISTANCE/DEVICE Description: STG Adhere to Safety Precautions With cues Assistance/Device. Outcome: Progressing   Problem: RH PAIN MANAGEMENT Goal: RH STG PAIN MANAGED AT OR BELOW PT'S PAIN GOAL Description: Pain < 4 with prns Outcome: Progressing   Problem: RH KNOWLEDGE DEFICIT GENERAL Goal: RH STG INCREASE KNOWLEDGE OF SELF CARE AFTER HOSPITALIZATION Description: Patient and family will be able to manage care at discharge using educational resources independently Outcome: Progressing

## 2024-01-17 NOTE — Progress Notes (Addendum)
 Physical Therapy Session Note  Patient Details  Name: Kylie [name] MRN: [id number] Date of Birth: 07-25-1990  Today's Date: 01/17/2024 PT Individual Time: 1045-1130  PT Individual Time Calculation (min): 45 min  Short Term Goals: Week 1:  PT Short Term Goal 1 (Week 1): pt will perform bed mobility with min A PT Short Term Goal 2 (Week 1): pt will perform all transfers with LRAD and CGA PT Short Term Goal 3 (Week 1): pt will ambulate 77ft with LRAD and CGA   Skilled Therapeutic Interventions/Progress Updates:  Chart reviewed and pt agreeable to therapy. Pt received semi-reclined in bed with 8/10 c/o pain in R foot. Also of note, pt c/o high nausea and dizziness t/o session. Session focused on bed mobility, functional transfers, balance, and endurance to promote functional recovery for out of bed mobility and safe home mobility and access. Pt initiated session with transfer to EOB and SPT to BSC using CGA + RW. Pt then completed same SPT to WC using CGA + RW. Pt c/o of high pain and spasm in foot. Pt completed SPT back to bed using CGA. Pt required modA for RLE management. Pt then c/o higher nausea. Nursing paged into room for pain and nausea management. Pt politely declined further PT at this time 2/2 nausea. Session education emphasized therapy goals and care continuum. At end of session, pt was left semi-reclined in bed with alarm engaged, nurse call bell and all needs in reach.     Therapy Documentation Precautions:  Precautions Precautions: Fall Recall of Precautions/Restrictions: Intact Required Braces or Orthoses: Splint/Cast Splint/Cast: R ankle cast/ace Splint/Cast - Date Prophylactic Dressing Applied (if applicable): 01/08/24 Restrictions Weight Bearing Restrictions Per Provider Order: Yes RLE Weight Bearing Per Provider Order: Non weight bearing Other Position/Activity Restrictions: NO ROM to R hip/knee General: PT Amount of Missed Time (min): 30 Minutes PT Missed  Treatment Reason: Patient fatigue;Pain;Patient ill (Comment) (high nausea)    Therapy/Group: Individual Therapy   Warrick KANDICE Raspberry 01/17/2024, 11:46 AM

## 2024-01-17 NOTE — Progress Notes (Signed)
 Occupational Therapy Session Note  Patient Details  Name: Kylie [name] MRN: [id number] Date of Birth: 1991-02-22  Session 1 Today's Date: 01/17/2024 OT Individual Time: 9199-9085 OT Individual Time Calculation (min): 74 min  Session 2 Today's Date: 01/17/2024 OT Individual Time: 8653-8557 OT Individual Time Calculation (min): 56 min    Short Term Goals: Week 1:  OT Short Term Goal 1 (Week 1): Pt will complete LB dressing with Mod A OT Short Term Goal 2 (Week 1): Pt will complete functional transfers with CGA OT Short Term Goal 3 (Week 1): Pt will complete toileting hygiene with Min A  Skilled Therapeutic Interventions/Progress Updates:  Session 1: Skilled OT session completed to address ADL retraining. Pt received supine in bed, agreeable to participate in therapy. Pt reports 9/10 pain in RLE, pre-medicated upon arrival, OT provided pain intervention with rest breaks.   Supine>EOB with Mod A to position RLE d/t pain. Once seated EOB, pt completed UB dressing with set-up A, LB dressing with Max A to weave in RLE and pull pants up over bottom in standing. Pt required several rest breaks during dressing reporting pain and fatigue. EOB>WC scoot pivot completed with Min A, OT supporting RLE during transfer. Pt experiencing increased pain seated in WC at RLE. Pt dependently propelled to ADL apt, OT provided education on DME for walk-in shower, pt reports built in bench at home surrounding entire shower wall and can not support DME. Pt attempts to stand pivot over walk-in shower threshold, experiencing nausea and fatigue, returned to [facility]. Pt dependently propelled back to room, RN notified of pain and nausea. WC>Bed Stand pivot Min A to reach standing d/t pain. OT provided education on energy conservation and importance of OOB participation, pt verbalized understanding. Pt returned to supine, supporting RLE with BUE, bed alarm on and all needs within reach.    Session 2: Skilled OT session  completed to address functional mobility and IADLs. Pt received supine in bed, agreeable to participate in therapy. Pt reports no pain.  Pt declines self-care needs at this time, OT provided education on covering RLE for bathing at this time and demo'd how to cover. OT reviewed NWB precautions on RLE status, pt and mom verbalized understanding. Pt completed Supine>EOB with CGA, pt supporting RLE with BUE. Seated EOB, pt places foam pad to reinforce NWB precautions. EOB>WC Stand pivot with RW, CGA. Pt declining standing activity at this time d/t decreased activity tolerance in standing. Pt self-propelled 28ft+ to main gym with 2 rest breaks. Pt completed 3x WC<>EOM transfers stand pivot with RW ranging from CGA-Min A to reach standing to support engagement in functional transfers. At trial 3 pt begins to experience fatigue, OT provided rest break. Pt returned back to [facility] and self-propelled to laundry room. Pt attempts to stand in laundry room at RW, d/t fatigue declines standing. OT provided education on modifying activity seated in WC/standing at [facility] and moving items at level to access when completing task at both heights. Pt verbalized understanding, self-propelled back to room, declined staying OOB d/t fatigue. WC>EOB Min A to manage RLE. Pt able to lift RLE in bed when returning to supine. Pt in supine with bed alarm on and all needs within reach.  Therapy Documentation Precautions:  Precautions Precautions: Fall Recall of Precautions/Restrictions: Intact Required Braces or Orthoses: Splint/Cast Splint/Cast: R ankle cast/ace Splint/Cast - Date Prophylactic Dressing Applied (if applicable): 01/08/24 Restrictions Weight Bearing Restrictions Per Provider Order: Yes RLE Weight Bearing Per Provider Order: Non weight bearing Other  Position/Activity Restrictions: NO ROM to R hip/knee   Therapy/Group: Individual Therapy  Meleena Munroe Woods-Chance, MS, OTR/L 01/17/2024, 7:47 AM

## 2024-01-18 DIAGNOSIS — D62 Acute posthemorrhagic anemia: Secondary | ICD-10-CM | POA: Diagnosis not present

## 2024-01-18 DIAGNOSIS — K5901 Slow transit constipation: Secondary | ICD-10-CM | POA: Diagnosis not present

## 2024-01-18 DIAGNOSIS — N939 Abnormal uterine and vaginal bleeding, unspecified: Secondary | ICD-10-CM

## 2024-01-18 DIAGNOSIS — R110 Nausea: Secondary | ICD-10-CM | POA: Diagnosis not present

## 2024-01-18 DIAGNOSIS — T1490XA Injury, unspecified, initial encounter: Secondary | ICD-10-CM | POA: Diagnosis not present

## 2024-01-18 MED ORDER — METOCLOPRAMIDE HCL 5 MG PO TABS
10.0000 mg | ORAL_TABLET | Freq: Three times a day (TID) | ORAL | Status: DC
  Administered 2024-01-18 – 2024-01-20 (×6): 10 mg via ORAL
  Filled 2024-01-18 (×6): qty 2
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
## 2024-01-18 NOTE — Plan of Care (Signed)
  Problem: Consults Goal: RH GENERAL PATIENT EDUCATION Description: See Patient Education module for education specifics. Outcome: Progressing   Problem: RH BOWEL ELIMINATION Goal: RH STG MANAGE BOWEL WITH ASSISTANCE Description: STG Manage Bowel with mod I Assistance. Outcome: Progressing Goal: RH STG MANAGE BOWEL W/MEDICATION W/ASSISTANCE Description: STG Manage Bowel with Medication with mod I  Assistance. Outcome: Progressing   Problem: RH BLADDER ELIMINATION Goal: RH STG MANAGE BLADDER WITH ASSISTANCE Description: STG Manage Bladder With mod I Assistance Outcome: Progressing Goal: RH STG MANAGE BLADDER WITH MEDICATION WITH ASSISTANCE Description: STG Manage Bladder With Medication With Assistance. Outcome: Progressing   Problem: RH SAFETY Goal: RH STG ADHERE TO SAFETY PRECAUTIONS W/ASSISTANCE/DEVICE Description: STG Adhere to Safety Precautions With cues Assistance/Device. Outcome: Progressing   Problem: RH PAIN MANAGEMENT Goal: RH STG PAIN MANAGED AT OR BELOW PT'S PAIN GOAL Description: Pain < 4 with prns Outcome: Progressing   Problem: RH KNOWLEDGE DEFICIT GENERAL Goal: RH STG INCREASE KNOWLEDGE OF SELF CARE AFTER HOSPITALIZATION Description: Patient and family will be able to manage care at discharge using educational resources independently Outcome: Progressing

## 2024-01-19 ENCOUNTER — Inpatient Hospital Stay (HOSPITAL_COMMUNITY)

## 2024-01-19 DIAGNOSIS — T1490XA Injury, unspecified, initial encounter: Secondary | ICD-10-CM | POA: Diagnosis not present

## 2024-01-19 LAB — BASIC METABOLIC PANEL WITH GFR
Anion gap: 11 (ref 5–15)
BUN: 10 mg/dL (ref 6–20)
CO2: 26 mmol/L (ref 22–32)
Calcium: 8.8 mg/dL — ABNORMAL LOW (ref 8.9–10.3)
Chloride: 98 mmol/L (ref 98–111)
Creatinine, Ser: 0.61 mg/dL (ref 0.44–1.00)
GFR, Estimated: >60 mL/min
Glucose, Bld: 108 mg/dL — ABNORMAL HIGH (ref 70–99)
Potassium: 4.5 mmol/L (ref 3.5–5.1)
Sodium: 135 mmol/L (ref 135–145)

## 2024-01-19 LAB — CBC
HCT: 25 % — ABNORMAL LOW (ref 36.0–46.0)
Hemoglobin: 8.2 g/dL — ABNORMAL LOW (ref 12.0–15.0)
MCH: 29.5 pg (ref 26.0–34.0)
MCHC: 32.8 g/dL (ref 30.0–36.0)
MCV: 89.9 fL (ref 80.0–100.0)
Platelets: 244 K/uL (ref 150–400)
RBC: 2.78 MIL/uL — ABNORMAL LOW (ref 3.87–5.11)
RDW: 13.2 % (ref 11.5–15.5)
WBC: 5.5 K/uL (ref 4.0–10.5)
nRBC: 0.4 % — ABNORMAL HIGH (ref 0.0–0.2)

## 2024-01-19 MED ORDER — ENOXAPARIN SODIUM 40 MG/0.4ML IJ SOSY
40.0000 mg | PREFILLED_SYRINGE | INTRAMUSCULAR | Status: DC
  Administered 2024-01-20 – 2024-01-21 (×2): 40 mg via SUBCUTANEOUS
  Filled 2024-01-19 (×2): qty 0.4

## 2024-01-19 MED ORDER — VITAMIN D 25 MCG (1000 UNIT) PO TABS
2000.0000 [IU] | ORAL_TABLET | Freq: Every day | ORAL | Status: DC
  Administered 2024-01-20: 2000 [IU] via ORAL
  Filled 2024-01-19: qty 2

## 2024-01-19 MED ORDER — OXYCODONE HCL 5 MG PO TABS
20.0000 mg | ORAL_TABLET | ORAL | Status: DC | PRN
  Administered 2024-01-19 – 2024-01-22 (×16): 20 mg via ORAL
  Filled 2024-01-19 (×17): qty 4
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
## 2024-01-19 NOTE — Progress Notes (Signed)
 Patient ID: Kylie [name], female   DOB: 06-14-1990, 33 y.o.   MRN: [id number]  Emailed and faxed over disability application to [facility]
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
## 2024-01-19 NOTE — Progress Notes (Signed)
 Physical Therapy Session Note  Patient Details  Name: Kylie [name] MRN: [id number] Date of Birth: 06/17/1990  Today's Date: 01/19/2024 PT Individual Time: [phone], 1300-1400 PT Individual Time Calculation (min): 71 min, 60 min PT missed time: 15 min (pain, fatigue)   Short Term Goals: Week 1:  PT Short Term Goal 1 (Week 1): pt will perform bed mobility with min A PT Short Term Goal 2 (Week 1): pt will perform all transfers with LRAD and CGA PT Short Term Goal 3 (Week 1): pt will ambulate 3ft with LRAD and CGA  Skilled Therapeutic Interventions/Progress Updates:      Treatment Session 1  Pt supine in bed upon arrival. Pt crying and endorsing 9/10 pain in R foot > lateral aspect of knee. Notified nurse. Premedicated. Notified MD and PA as pt endorsing increased pain today beginning last night. Pt endorses no change in pain with repositioning. Pt endorses burning pain with intermittent R LE spasms. MD present to assess pt during session. Pt endorses pain reduced to 6/10 at end of session.   Supine to sit with supervision with pt managing R LE with B UE--pt overall very guarded.   Pt performed stand pivot transfer bed<> WC with min A for management of R LE for adherence to R LE NWBing precautions. Pt requesting to rest R LE on leg rest on floor for improved comfort. PT recommending pt lift R LE for adherence to R LE NWBing however pt required assistance to do so.   Treatment session held at [facility] level per pt request for pain management. Treatment session focused on WC mobility, community reintergration, obstacle negotiation. Pt self propelled WC from 4th floor to atrium on level, unlevel surfaces, in and out of elevator, in and out of panera with supervision (intermittent min A for backing onto elevator). Verbal cues provided for technique and safety with R LE.   Pt completed 10 min on UBE (5 min forwards, 5 min backwards) for total of 270 revolutions.  X ray tech present upon return to  room to perform X ray from [facility] level. PT assisted with R LE positioning and DME management. Pt requesting to get back in bed.   Pt performed stand pivot transfer WC to bed with RW and min A with PT assisting to keep R LE off of floor for R LE NWBing precautions.   Sit to supine with light min A for management of R LE.   Pt supine in bed with all needs within reach and bed alarm on.   Treatment Session 2   Pt supine in bed upon arrival. Pt agreeable to therapy. Pt endorses R LE knee pain 5/10 at rest, 7/10 with mobility; pt endorses increased muscle spasms with mobility, premedicated. Therapist provided rest breaks and repositioning as needed for pain management.   Supine to sit with supervision and increased time, pt guarded of R LE.   PT answered pt questions regarding technique for transfer and adherence to R LE NWBing precautions. PT discouraged pt from utilziing R LE support on padded leg rest. Pt agreeable with support, encouragement and education.   Pt performed stand pivot transfer bed to [facility] with RW and CGA, pt demonstrates improved management of R LE with adherence to R LE NWBing precautions, verbal cues provided for technique.   Pt hopped x5 feet with RW and CGA with WC in tow, verbal cues and demonstration provided for technique.   Pt performed sit to stand from [facility] throughout session with RW and  light min A/CGA for management of R LE NWBing precautions, verbal cues and demonstration provided for technique.   Discussed pt bed at home. Pt endorses she slept on an air mattress prior to hospital stay she is planning to purchase a queen size bed. Pt provided picture.   Pt demonstrates improved carry over of stand pivot transfer WC to rehab apartment queen size bed with CGA, verbal cues provided for posterior hop to bed for prevention of premature sitting. Pt declining transferring sit to supine at this time.   Discussed pt car she is going home in. Pt unsure at the time, but endorses  will likely be one of her friends in a SUV. PT demonstrated stand pivot transfer with RW to car simulator-pt verbalized understanding but requesting to trial tomorrow 2/2 increased pain and fatigue.   Pt performed stand pivot transfer with close supervision/CGA with same technique as described above.   Pt supine in bed with all needs within reach and bed alarm on.   Pt missed 15 minutes 2/2 pain and fatigue. Will attempt to make up missed minutes as able.          Therapy Documentation Precautions:  Precautions Precautions: Fall Recall of Precautions/Restrictions: Intact Required Braces or Orthoses: Splint/Cast Splint/Cast: R ankle cast/ace Splint/Cast - Date Prophylactic Dressing Applied (if applicable): 01/08/24 Restrictions Weight Bearing Restrictions Per Provider Order: No RLE Weight Bearing Per Provider Order: Non weight bearing Other Position/Activity Restrictions: NO ROM to R hip/knee   Therapy/Group: Individual Therapy  [facility] [place], [place], DPT  01/19/2024, 7:21 AM

## 2024-01-20 ENCOUNTER — Inpatient Hospital Stay (HOSPITAL_COMMUNITY)

## 2024-01-20 DIAGNOSIS — F54 Psychological and behavioral factors associated with disorders or diseases classified elsewhere: Secondary | ICD-10-CM

## 2024-01-20 DIAGNOSIS — R52 Pain, unspecified: Secondary | ICD-10-CM

## 2024-01-20 DIAGNOSIS — T1490XA Injury, unspecified, initial encounter: Secondary | ICD-10-CM | POA: Diagnosis not present

## 2024-01-20 LAB — CBC WITH DIFFERENTIAL/PLATELET
Abs Immature Granulocytes: 0.1 K/uL — ABNORMAL HIGH (ref 0.00–0.07)
Basophils Absolute: 0 K/uL (ref 0.0–0.1)
Basophils Relative: 1 %
Eosinophils Absolute: 0 K/uL (ref 0.0–0.5)
Eosinophils Relative: 1 %
HCT: 26.6 % — ABNORMAL LOW (ref 36.0–46.0)
Hemoglobin: 8.7 g/dL — ABNORMAL LOW (ref 12.0–15.0)
Immature Granulocytes: 2 %
Lymphocytes Relative: 23 %
Lymphs Abs: 1.4 K/uL (ref 0.7–4.0)
MCH: 29.2 pg (ref 26.0–34.0)
MCHC: 32.7 g/dL (ref 30.0–36.0)
MCV: 89.3 fL (ref 80.0–100.0)
Monocytes Absolute: 0.6 K/uL (ref 0.1–1.0)
Monocytes Relative: 10 %
Neutro Abs: 3.9 K/uL (ref 1.7–7.7)
Neutrophils Relative %: 63 %
Platelets: 332 K/uL (ref 150–400)
RBC: 2.98 MIL/uL — ABNORMAL LOW (ref 3.87–5.11)
RDW: 13.4 % (ref 11.5–15.5)
WBC: 6 K/uL (ref 4.0–10.5)
nRBC: 0.3 % — ABNORMAL HIGH (ref 0.0–0.2)

## 2024-01-20 MED ORDER — SENNOSIDES-DOCUSATE SODIUM 8.6-50 MG PO TABS
2.0000 | ORAL_TABLET | Freq: Two times a day (BID) | ORAL | Status: DC
  Administered 2024-01-20 – 2024-01-26 (×12): 2 via ORAL
  Filled 2024-01-20 (×12): qty 2

## 2024-01-20 MED ORDER — DULOXETINE HCL 20 MG PO CPEP
20.0000 mg | ORAL_CAPSULE | Freq: Every day | ORAL | Status: DC
  Administered 2024-01-20 – 2024-01-23 (×4): 20 mg via ORAL
  Filled 2024-01-20 (×4): qty 1

## 2024-01-20 MED ORDER — HYDROMORPHONE HCL 1 MG/ML IJ SOLN
0.5000 mg | Freq: Three times a day (TID) | INTRAMUSCULAR | Status: DC | PRN
  Administered 2024-01-20: 0.5 mg via INTRAVENOUS
  Filled 2024-01-20: qty 1

## 2024-01-20 MED ORDER — METOCLOPRAMIDE HCL 5 MG PO TABS
5.0000 mg | ORAL_TABLET | Freq: Three times a day (TID) | ORAL | Status: DC
  Administered 2024-01-20 – 2024-01-26 (×19): 5 mg via ORAL
  Filled 2024-01-20 (×19): qty 1

## 2024-01-20 MED ORDER — VITAMIN D 25 MCG (1000 UNIT) PO TABS
3000.0000 [IU] | ORAL_TABLET | Freq: Every day | ORAL | Status: DC
  Administered 2024-01-21: 3000 [IU] via ORAL
  Filled 2024-01-20: qty 3

## 2024-01-20 MED ORDER — POLYETHYLENE GLYCOL 3350 17 G PO PACK
17.0000 g | PACK | Freq: Every day | ORAL | Status: DC
  Administered 2024-01-21: 17 g via ORAL
  Filled 2024-01-20: qty 1
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
## 2024-01-20 NOTE — Progress Notes (Signed)
 Patient ID: Kylie [name], female   DOB: 1990/05/25, 33 y.o.   MRN: [id number]  This SW covering for primary SW, [name].   Therapy team requests family education.   1310-SW spoke with pt brother [name] to discuss family edu. Reports he will be staying with his sister this evening and can make himself available tomorrow. SW will follow-up with updates after team conference.   Graeme Jude, MSW, LCSW Office: [phone] Cell: [phone] Fax: [phone]
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
## 2024-01-20 NOTE — Progress Notes (Signed)
 Occupational Therapy Session Note  Patient Details  Name: Kylie [name] MRN: [id number] Date of Birth: Aug 08, 1990  Session 1 Today's Date: 01/20/2024 OT Individual Time: 9199-9170 OT Individual Time Calculation (min): 29 min  Session 2 Today's Date: 01/20/2024 OT Individual Time: 1130-1200 OT Individual Time Calculation (min): 30 min  Session 3 Today's Date: 01/20/2024 OT Individual Time: 1400-1430 OT Individual Time Calculation (min): 30 min    Short Term Goals: Week 1:  OT Short Term Goal 1 (Week 1): Pt will complete LB dressing with Mod A OT Short Term Goal 2 (Week 1): Pt will complete functional transfers with CGA OT Short Term Goal 3 (Week 1): Pt will complete toileting hygiene with Min A  Skilled Therapeutic Interventions/Progress Updates:  Session 1: Skilled OT session completed to address ADL retraining. Pt received supine in bed, agreeable to participate in therapy; however declining OOB tx at this time d/t nausea, pre-medicated prior to OT session. Pt reports no pain.  Pt completed oral care at bed level with set-up A to retrieve items, OT encouraged OOB participation to complete tasks to increase independence, pt continued to decline. Pt completed personal grooming washing face and arms with set-up A for wash cloths. OT provided education on OOB participation to increase mobility and overall independence in the home environment. Pt verbalized understadning, session completed with pt supine in bed, bed alarm on, and all needs met.  Session 2: Skilled OT session completed to address pain management and DME education. Pt received supine in bed, agreeable to participate in therapy; however declining OOB participation d/t pain.   Pt reports 8/10 pain in RLE, OT provided pain intervention by providing ice packs and elevating RLE per nursing orders. Pt reports decrease in pain with elevation and will trial for one hour. OT continues to reinforce OOB participation to increase  independence in the home environment. Education provided on recommended DME for home environment and differences of BSC and DABSC, pt prefers standard BSC. Pt declines need for toileting at this time. Pt declines need for shower chair as walk-in shower has two corner benches. OT to simulate transfer at PM session. Session completed with pt supine in bed, bed alarm on, and all needs within reach.  Session 3: Skilled OT session completed to address shower transfers and personal grooming. Pt received seated in recliner, agreeable to participate in therapy. Pt reports 7/10 pain in RLE, OT provided pain intervention with ice; pt however declining transfers until pain medication provided by RN.   Pt completed personal grooming with set-up A to retrieve items. Pt taking increased time to brush hair. OT provides education on current discharge plan and reinforces need to participate as well as navigating pain. Pt verbalized understanding. RN provided pain meds, pt willing to participate. OT demo'd walk-in shower transfer with shower seat at corner to simulate home environment. Pt attempts to return demo experiencing increased pain when RLE lowered from recliner. OT supported RLE with Min A, pt required rest break d/t pain. Pt declines transfer and requests to return to bed. Recliner>EOB with RW CGA displaying good safety awareness and adherence to NWB precautions. Min A provided to reach supine to support RLE. Pt supine in bed with bed alarm on and all needs within reach. Therapy Documentation Precautions:  Precautions Precautions: Fall Recall of Precautions/Restrictions: Intact Required Braces or Orthoses: Splint/Cast Splint/Cast: R ankle cast/ace Splint/Cast - Date Prophylactic Dressing Applied (if applicable): 01/08/24 Restrictions Weight Bearing Restrictions Per Provider Order: No RLE Weight Bearing Per Provider Order:  Non weight bearing Other Position/Activity Restrictions: NO ROM to R  hip/knee   Therapy/Group: Individual Therapy  [place]-Chance, MS, OTR/L 01/20/2024, 7:58 AM

## 2024-01-20 NOTE — Progress Notes (Addendum)
 Physical Therapy Session Note  Patient Details  Name: Kylie [name] MRN: [id number] Date of Birth: 1991-02-24  Today's Date: 01/20/2024 PT Individual Time: 1002-1100, 8699-8655 PT Individual Time Calculation (min): 58 min, 44 min   Short Term Goals: Week 1:  PT Short Term Goal 1 (Week 1): pt will perform bed mobility with min A PT Short Term Goal 2 (Week 1): pt will perform all transfers with LRAD and CGA PT Short Term Goal 3 (Week 1): pt will ambulate 55ft with LRAD and CGA  Skilled Therapeutic Interventions/Progress Updates:      Treatment session 1    Pt supine in bed upon arrival. Pt agreeable to therapy. Pt endorses 8/10 R foot pain, pt requesting pain medication. Notified nurse.   Supine to sit with supervision with use of bed features.   Pt perfored stand pivot transfer bed to [facility] with RW and CGA, verbal cues provided for sequencing/technique with emphasis on not sitting prematurely. Pt continent of bladder.   Pt insisting on getting back in bed after toileting and endorses she will not do further therapy until she gets her medicine. Notified nurse. Nurse present to administer.   Sit to stand/stand pivot transfer with RW and CGA progressing to close supervision, verbal cues provided for technique/sequencing.   Stand pivot transfer to car simulator with RW and close supervision, pt able to lift B LE in and out of car with supervision, verbal cues provided to scoot back in seat for pain management   Donning/doffing leg rests with supervision for L LE, min A for R LE, vebrla cues provided for sequencing/technique.   Initiated stair training with shower chiar technique: pt navigated 3 steps with B UE support on R ascending handrail with therapist managing R LE for aderence to R LE NWBing. +2 for management of shower chair.   Encouraged pt to sit OOB between session for improved upright/activity tolerance. Pt verbalized understanding and says she will try tomorrow to sit up in  [facility] between sessions.   Pt supine in bed at end of session with all needs within reach, R LE elevated and bed alarm on.   Treatment Session 2   Pt supine in bed upon arrival. Pt agreeable to therapy with encouragement from PT.  Pt endorses R LE foot pain 9/10, premedicated. Therapist provided rest breaks and repositioning throughout session and donned ice to R LE at end of session.  Pt performed stand pivot transfer throughout session with CGA progressing to close supervision, verbal cues provided on one occasion for correction of pt attempt to sit prematurely.   Pt transferred to queen size bed in rehab apartment. Pt demonstrates improved ease and pain management with sit to supine on L side of bed versus R.   Pt seated in recliner at end of session with all needs within reach and chair alarm on.                  Therapy Documentation Precautions:  Precautions Precautions: Fall Recall of Precautions/Restrictions: Intact Required Braces or Orthoses: Splint/Cast Splint/Cast: R ankle cast/ace Splint/Cast - Date Prophylactic Dressing Applied (if applicable): 01/08/24 Restrictions Weight Bearing Restrictions Per Provider Order: No RLE Weight Bearing Per Provider Order: Non weight bearing Other Position/Activity Restrictions: NO ROM to R hip/knee   Therapy/Group: Individual Therapy  [facility] [place], [place], DPT  01/20/2024, 10:06 AM

## 2024-01-20 NOTE — Plan of Care (Signed)
  Problem: Consults Goal: RH GENERAL PATIENT EDUCATION Description: See Patient Education module for education specifics. Outcome: Progressing   Problem: RH BOWEL ELIMINATION Goal: RH STG MANAGE BOWEL WITH ASSISTANCE Description: STG Manage Bowel with mod I Assistance. Outcome: Progressing Goal: RH STG MANAGE BOWEL W/MEDICATION W/ASSISTANCE Description: STG Manage Bowel with Medication with mod I  Assistance. Outcome: Progressing   Problem: RH BLADDER ELIMINATION Goal: RH STG MANAGE BLADDER WITH ASSISTANCE Description: STG Manage Bladder With mod I Assistance Outcome: Progressing Goal: RH STG MANAGE BLADDER WITH MEDICATION WITH ASSISTANCE Description: STG Manage Bladder With Medication With Assistance. Outcome: Progressing   Problem: RH SAFETY Goal: RH STG ADHERE TO SAFETY PRECAUTIONS W/ASSISTANCE/DEVICE Description: STG Adhere to Safety Precautions With cues Assistance/Device. Outcome: Progressing   Problem: RH PAIN MANAGEMENT Goal: RH STG PAIN MANAGED AT OR BELOW PT'S PAIN GOAL Description: Pain < 4 with prns Outcome: Progressing   Problem: RH KNOWLEDGE DEFICIT GENERAL Goal: RH STG INCREASE KNOWLEDGE OF SELF CARE AFTER HOSPITALIZATION Description: Patient and family will be able to manage care at discharge using educational resources independently Outcome: Progressing

## 2024-01-21 DIAGNOSIS — T1490XA Injury, unspecified, initial encounter: Secondary | ICD-10-CM | POA: Diagnosis not present

## 2024-01-21 MED ORDER — METOPROLOL TARTRATE 25 MG PO TABS
25.0000 mg | ORAL_TABLET | Freq: Two times a day (BID) | ORAL | Status: DC
  Administered 2024-01-21 – 2024-01-26 (×10): 25 mg via ORAL
  Filled 2024-01-21 (×10): qty 1

## 2024-01-21 MED ORDER — ENOXAPARIN SODIUM 30 MG/0.3ML IJ SOSY
30.0000 mg | PREFILLED_SYRINGE | Freq: Two times a day (BID) | INTRAMUSCULAR | Status: DC
Start: 2024-01-22 — End: 2024-01-23
  Administered 2024-01-22 – 2024-01-23 (×3): 30 mg via SUBCUTANEOUS
  Filled 2024-01-21 (×3): qty 0.3

## 2024-01-21 MED ORDER — MAGNESIUM CITRATE PO SOLN
1.0000 | Freq: Once | ORAL | Status: AC
  Administered 2024-01-21: 1 via ORAL
  Filled 2024-01-21: qty 296

## 2024-01-21 MED ORDER — VITAMIN D 25 MCG (1000 UNIT) PO TABS
4000.0000 [IU] | ORAL_TABLET | Freq: Every day | ORAL | Status: DC
  Administered 2024-01-22 – 2024-01-26 (×5): 4000 [IU] via ORAL
  Filled 2024-01-21 (×6): qty 4

## 2024-01-21 MED ORDER — POLYETHYLENE GLYCOL 3350 17 G PO PACK
34.0000 g | PACK | Freq: Every day | ORAL | Status: DC
  Administered 2024-01-22 – 2024-01-26 (×4): 34 g via ORAL
  Filled 2024-01-21 (×5): qty 2

## 2024-01-21 NOTE — Progress Notes (Signed)
 Occupational Therapy Session Note  Patient Details  Name: Kylie [name] MRN: [id number] Date of Birth: Aug 10, 1990  Today's Date: 01/21/2024 OT Individual Time: [phone] OT Individual Time Calculation (min): 40 min  and Today's Date: 01/21/2024 OT Missed Time: 5 Minutes Missed Time Reason: Pain   Short Term Goals: Week 1:  OT Short Term Goal 1 (Week 1): Pt will complete LB dressing with Mod A OT Short Term Goal 2 (Week 1): Pt will complete functional transfers with CGA OT Short Term Goal 3 (Week 1): Pt will complete toileting hygiene with Min A  Skilled Therapeutic Interventions/Progress Updates:    Patient agreeable to participate in OT session. Reports 10/10 pain level.   Patient participated in skilled OT session focusing on functional mobility, pain management, UE strengthening, and OOB tolerance. Patient received in bed complaint of extreme pain reported to RN for medication. Patient then sat on EOB with rail assistance. Patient able to complete sit to stand x4 to increase LE strength and mobility with OOB tolerance with CGA from elevated bed. Patient required increased breaks and verbal encouragement due to pain. Patient then completed functional mobility 4 feet forward and backward with hopping within precautions. Patient required rest following this activity. Patient completed scooting on EOB with buttocks elevated from bed utilizing one LE and both UE for strengthening, NWB leg strengthening, and ability to complete transfers with CGA and verbal cues for pain. Patient then requested to return to bed early due to pain and nausea. OT provided encouragement, education, and warm blankets. Patient left in bed with brother with alarm on needs in reach .  Therapy Documentation Precautions:  Precautions Precautions: Fall Recall of Precautions/Restrictions: Intact Required Braces or Orthoses: Splint/Cast Splint/Cast: R ankle cast/ace Splint/Cast - Date Prophylactic Dressing Applied (if  applicable): 01/08/24 Restrictions Weight Bearing Restrictions Per Provider Order: No RLE Weight Bearing Per Provider Order: Non weight bearing Other Position/Activity Restrictions: NO ROM to R hip/knee General: General OT Amount of Missed Time: 5 Minutes  Therapy/Group: Individual Therapy  D'mariea L Shannen Vernon 01/21/2024, 4:34 PM

## 2024-01-21 NOTE — Progress Notes (Signed)
 Occupational Therapy Session Note  Patient Details  Name: Kylie [name] MRN: [id number] Date of Birth: 1990-12-22  Today's Date: 01/21/2024 OT Individual Time: 1335-1500 OT Individual Time Calculation (min): 85 min    Short Term Goals: Week 1:  OT Short Term Goal 1 (Week 1): Pt will complete LB dressing with Mod A OT Short Term Goal 2 (Week 1): Pt will complete functional transfers with CGA OT Short Term Goal 3 (Week 1): Pt will complete toileting hygiene with Min A  Skilled Therapeutic Interventions/Progress Updates:  Skilled OT session completed to address ADL retraining and discharge planning. Pt received supine in bed, agreeable to participate in therapy. Pt reports 8/10 pain in RLE, OT provided pain intervention by repositioning and providing rest breaks, RN provided medication during session. All transfers this session completed stand pivot CGA with RW, with pt displaying good adherence to NWB precautions on RLE.   Pt requested to void, EOB>BSC, pt completed toileting hygiene seated with set-up A to retrieve toilet paper. BSC>WC, brother present in room. OT provides education on functional transfers and NWB precautions, pt and brother verbalized understanding. Pt dependently propelled to simulation car to complete car transfer. Brother recalls cues from PT session and would like hands on practice to provide A to pt. OT provides cues for set-up of WC and RW during transfer as well as ensuring pt is safely seated prior to moving BLE into car. Pt and brother complete WC<>car, displaying good safety awareness and no difficulty with equipment management. Pt reporting increased fatigue and required rest break. Pt returned to room, OT provides education on current home set-up for showers and how to complete shower transfers, brother reporting shower chair ordered for home if pt can not manage built in corner seat. OT simultes home environment with TTB and walk-in shower, pt ambulates to bathroom  with RW CGA seated at TTB. Pt provides education supporting RLE to adhere to NWB precautions during shower and covering RLE d/t cast at this time. Pt and brother verbalized understanding. Pt completed shower with supervision for safety to adhere to NWB precautions. Pt reporting increased fatigue and pain, requires total A to don grip sock d/t pain. Pt requesting to discontinue task and return to bed. Donned clothing with set-up A, TTB>WC, WC>EOB. Pt supine in bed with brother present and all needs in reach.  Therapy Documentation Precautions:  Precautions Precautions: Fall Recall of Precautions/Restrictions: Intact Required Braces or Orthoses: Splint/Cast Splint/Cast: R ankle cast/ace Splint/Cast - Date Prophylactic Dressing Applied (if applicable): 01/08/24 Restrictions Weight Bearing Restrictions Per Provider Order: No RLE Weight Bearing Per Provider Order: Non weight bearing Other Position/Activity Restrictions: NO ROM to R hip/knee    Therapy/Group: Individual Therapy  Tiandra [place]-Chance, MS, OTR/L 01/21/2024, 7:50 AM
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
## 2024-01-21 NOTE — Progress Notes (Signed)
 Occupational Therapy Note  Patient Details  Name: Kylie [name] MRN: [id number] Date of Birth: 1991/01/24    Occupational Therapist participated in the interdisciplinary team conference, providing clinical information regarding the patient's current status, treatment goals, and weekly focus, including any barriers that need to be addressed. Please see the Inpatient Rehabilitation Team Conference and Plan of Care Update for further details.   Anab Vivar Woods-Chance, MS, OTR/L 01/21/2024, 7:58 AM
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
## 2024-01-21 NOTE — Progress Notes (Signed)
 Physical Therapy Session Note  Patient Details  Name: Kylie [name] MRN: [id number] Date of Birth: 07-13-1990  Today's Date: 01/21/2024 PT Individual Time: 1000-1057 PT Individual Time Calculation (min): 57 min   Short Term Goals: Week 1:  PT Short Term Goal 1 (Week 1): pt will perform bed mobility with min A PT Short Term Goal 2 (Week 1): pt will perform all transfers with LRAD and CGA PT Short Term Goal 3 (Week 1): pt will ambulate 89ft with LRAD and CGA  Skilled Therapeutic Interventions/Progress Updates:      Treatment Session 1  Pt supine in bed upon arrival. Pt agreeable to therapy. Pt endorses R LE pain (Hip down to foot), abdominal pain, and feeling as though she is hot, premedicated. Pt requesting IV to be removed, nurse present to remove.   Vitals assessed: BP 111/71 HR 78 temp 97.9   Pt requesting to use bathroom. Pt performed with RW and close supervision, verbal cues provided for adherence to R LE NWBing.   Intitiated family training with pt brother Kylie [name]. Education provided regarding pt impairments and R LE NWBing precautions. Education provided for D/C date 11/3. PT answered pt and caregiver questions regarding discharge process.   Education provided for management of RW and WC with emphasis on folding WC for transport, donning/doffing leg rests.   Stair navigation with shower chair technique with light min A for mangagement of R LE with pt brother managing shower chair. Pt demonstrated and pt and pt brother returned demonstration. Recommended pt purchase shower chair.   Pt and pt brother returned demonstration of stand pivot transfer, and short distance ambulatory transfer with RW and CGA.   Demonstrated car transfer--however did not have enough time to practice during session. PT notified OT and deferred it to afternoon OT session.   Pt supine in bed with all needs within reach and bed alarm on.        Therapy Documentation Precautions:   Precautions Precautions: Fall Recall of Precautions/Restrictions: Intact Required Braces or Orthoses: Splint/Cast Splint/Cast: R ankle cast/ace Splint/Cast - Date Prophylactic Dressing Applied (if applicable): 01/08/24 Restrictions Weight Bearing Restrictions Per Provider Order: No RLE Weight Bearing Per Provider Order: Non weight bearing Other Position/Activity Restrictions: NO ROM to R hip/knee   Therapy/Group: Individual Therapy  [facility] Cornelia, , DPT  01/21/2024, 8:00 AM

## 2024-01-21 NOTE — Progress Notes (Signed)
 Occupational Therapy Session Note  Patient Details  Name: Kylie [name] MRN: [id number] Date of Birth: Nov 27, 1990  Today's Date: 01/21/2024 OT Individual Time: 1300-1329 OT Individual Time Calculation (min): 29 min    Short Term Goals: Week 1:  OT Short Term Goal 1 (Week 1): Pt will complete LB dressing with Mod A OT Short Term Goal 2 (Week 1): Pt will complete functional transfers with CGA OT Short Term Goal 3 (Week 1): Pt will complete toileting hygiene with Min A  Skilled Therapeutic Interventions/Progress Updates:      Therapy Documentation Precautions:  Precautions Precautions: Fall Recall of Precautions/Restrictions: Intact Required Braces or Orthoses: Splint/Cast Splint/Cast: R ankle cast/ace Splint/Cast - Date Prophylactic Dressing Applied (if applicable): 01/08/24 Restrictions Weight Bearing Restrictions Per Provider Order: No RLE Weight Bearing Per Provider Order: Non weight bearing Other Position/Activity Restrictions: NO ROM to R hip/knee General:  Pt supine in bed upon OT arrival, agreeable to OT session. Brother present  Pain:  7/10 pain reported in RLE, OT applied ice pack for pain and distractions provided for pain management, pt reports tolerable to proceed.   Other Treatments: OT providing therapeutic use of self in order to build rapport and discuss patient current situation and goals for therapy. OT discussing DME and where to purchase for items not covered by insurance. Pt and OT discussing child/pet care and pt reporting brother is willing to assist with tasks. OT discussing ways to modify care within precautions. Pt reporting will be getting W/C through insurance.    Pt supine in bed with bed alarm activated, 2 bed rails up, call light within reach and 4Ps assessed.   Therapy/Group: Individual Therapy  Camie Hoe, OTD, OTR/L 01/21/2024, 1:30 PM

## 2024-01-21 NOTE — Plan of Care (Signed)
  Problem: Consults Goal: RH GENERAL PATIENT EDUCATION Description: See Patient Education module for education specifics. Outcome: Progressing   Problem: RH BOWEL ELIMINATION Goal: RH STG MANAGE BOWEL WITH ASSISTANCE Description: STG Manage Bowel with mod I Assistance. Outcome: Progressing Goal: RH STG MANAGE BOWEL W/MEDICATION W/ASSISTANCE Description: STG Manage Bowel with Medication with mod I  Assistance. Outcome: Progressing   Problem: RH BLADDER ELIMINATION Goal: RH STG MANAGE BLADDER WITH ASSISTANCE Description: STG Manage Bladder With mod I Assistance Outcome: Progressing Goal: RH STG MANAGE BLADDER WITH MEDICATION WITH ASSISTANCE Description: STG Manage Bladder With Medication With Assistance. Outcome: Progressing   Problem: RH SAFETY Goal: RH STG ADHERE TO SAFETY PRECAUTIONS W/ASSISTANCE/DEVICE Description: STG Adhere to Safety Precautions With cues Assistance/Device. Outcome: Progressing   Problem: RH PAIN MANAGEMENT Goal: RH STG PAIN MANAGED AT OR BELOW PT'S PAIN GOAL Description: Pain < 4 with prns Outcome: Progressing   Problem: RH KNOWLEDGE DEFICIT GENERAL Goal: RH STG INCREASE KNOWLEDGE OF SELF CARE AFTER HOSPITALIZATION Description: Patient and family will be able to manage care at discharge using educational resources independently Outcome: Progressing

## 2024-01-21 NOTE — Progress Notes (Signed)
 Patient ID: Kylie [name], female   DOB: Apr 03, 1990, 33 y.o.   MRN: [id number]  This SW covering for primary SW, [name].  1700- SW spoke with pt brother [name] to provide updates from team conference, and d/c date 11/3. Discussed d/c recs outpatient PT/OT and DME- w/c and 3in1 BSC. SW will order DME. Preferred outpatient location- [facility].   SW ordered DME with [employer] via parachute.  Graeme Jude, MSW, LCSW Office: [phone] Cell: [phone] Fax: [phone]

## 2024-01-21 NOTE — Progress Notes (Signed)
 Physical Therapy Note  Patient Details  Name: Kylie [name] MRN: [id number] Date of Birth: 1990/04/12 Today's Date: 01/21/2024    Physical Therapist participated in the interdisciplinary team conference, providing clinical information regarding the patient's current status, treatment goals, and weekly focus, including any barriers that need to be addressed. Please see the Inpatient Rehabilitation Team Conference and Plan of Care Update for further details.     Dellas Guard P Roben Tatsch 01/21/2024, 11:31 AM

## 2024-01-22 DIAGNOSIS — T1490XA Injury, unspecified, initial encounter: Secondary | ICD-10-CM | POA: Diagnosis not present

## 2024-01-22 LAB — CBC
HCT: 26.2 % — ABNORMAL LOW (ref 36.0–46.0)
Hemoglobin: 8.6 g/dL — ABNORMAL LOW (ref 12.0–15.0)
MCH: 29.3 pg (ref 26.0–34.0)
MCHC: 32.8 g/dL (ref 30.0–36.0)
MCV: 89.1 fL (ref 80.0–100.0)
Platelets: 327 K/uL (ref 150–400)
RBC: 2.94 MIL/uL — ABNORMAL LOW (ref 3.87–5.11)
RDW: 13.6 % (ref 11.5–15.5)
WBC: 4.8 K/uL (ref 4.0–10.5)
nRBC: 0 % (ref 0.0–0.2)

## 2024-01-22 LAB — BASIC METABOLIC PANEL WITH GFR
Anion gap: 11 (ref 5–15)
BUN: 7 mg/dL (ref 6–20)
CO2: 28 mmol/L (ref 22–32)
Calcium: 9.1 mg/dL (ref 8.9–10.3)
Chloride: 97 mmol/L — ABNORMAL LOW (ref 98–111)
Creatinine, Ser: 0.57 mg/dL (ref 0.44–1.00)
GFR, Estimated: >60 mL/min (ref >60)
Glucose, Bld: 103 mg/dL — ABNORMAL HIGH (ref 70–99)
Potassium: 4.6 mmol/L (ref 3.5–5.1)
Sodium: 136 mmol/L (ref 135–145)

## 2024-01-22 MED ORDER — GABAPENTIN 300 MG PO CAPS
300.0000 mg | ORAL_CAPSULE | Freq: Three times a day (TID) | ORAL | Status: DC
  Filled 2024-01-22: qty 1

## 2024-01-22 MED ORDER — GABAPENTIN 300 MG PO CAPS
300.0000 mg | ORAL_CAPSULE | Freq: Three times a day (TID) | ORAL | Status: DC
Start: 2024-01-22 — End: 2024-01-26
  Administered 2024-01-22 – 2024-01-26 (×12): 300 mg via ORAL
  Filled 2024-01-22 (×12): qty 1

## 2024-01-22 MED ORDER — TRAMADOL HCL 50 MG PO TABS
50.0000 mg | ORAL_TABLET | Freq: Three times a day (TID) | ORAL | Status: DC
  Administered 2024-01-22 – 2024-01-26 (×17): 50 mg via ORAL
  Filled 2024-01-22 (×19): qty 1

## 2024-01-22 MED ORDER — MELATONIN 5 MG PO TABS
10.0000 mg | ORAL_TABLET | Freq: Every day | ORAL | Status: DC
  Administered 2024-01-22 – 2024-01-25 (×4): 10 mg via ORAL
  Filled 2024-01-22 (×4): qty 2

## 2024-01-22 MED ORDER — TIZANIDINE HCL 4 MG PO TABS
2.0000 mg | ORAL_TABLET | Freq: Three times a day (TID) | ORAL | Status: DC
  Administered 2024-01-22 – 2024-01-23 (×4): 2 mg via ORAL
  Filled 2024-01-22 (×4): qty 1

## 2024-01-22 MED ORDER — OXYCODONE HCL 5 MG PO TABS
15.0000 mg | ORAL_TABLET | ORAL | Status: DC | PRN
  Administered 2024-01-22 – 2024-01-26 (×22): 15 mg via ORAL
  Filled 2024-01-22 (×23): qty 3

## 2024-01-22 MED ORDER — METHOCARBAMOL 500 MG PO TABS
500.0000 mg | ORAL_TABLET | Freq: Three times a day (TID) | ORAL | Status: DC
  Administered 2024-01-22 – 2024-01-23 (×4): 500 mg via ORAL
  Filled 2024-01-22 (×4): qty 1

## 2024-01-22 NOTE — Progress Notes (Signed)
 Patient reports that most of her pain is in the ankle. Was informed she did not feel that gabapentin was effective and discussed that neuropathy tends to be a confounding factor in managing pain secondary to complexity of ankle Fx. Reported that tizanidine appears to work best-->will decrease robaxin to 500 mg tid and schedule Tizanidine 2 mg tid. If tolerates without SE can titrate HS dose to 4 mg for better sleep. Tramadol  added and can decrease gabapentin at BID for now.

## 2024-01-22 NOTE — Progress Notes (Signed)
 Pt refuse the suppository before lunch time. Said she wanted to wait until after therapy. Asked again after therapy and she refused again.

## 2024-01-22 NOTE — Discharge Instructions (Addendum)
   Inpatient Rehab Discharge Instructions  Kylie [name] Discharge date and time:    Activities/Precautions/ Functional Status: Activity: no lifting, driving, or strenuous exercise till cleared by MD Diet: regular diet Wound Care: Cleanse knee incision and upper tight with Hibiclens daily for every  other week. Keep wound clean and dry. Keep splint on ankle intact. Contact Dr. Celena if you notice any drainage, fever or chills.   Functional status:  ___ No restrictions     ___ Walk up steps independently _X__ 24/7 supervision/assistance   ___ Walk up steps with assistance ___ Intermittent supervision/assistance  ___ Bathe/dress independently ___ Walk with walker     _X__ Bathe/dress with assistance ___ Walk Independently    ___ Shower independently ___ Walk with assistance    ___ Shower with assistance _X__ No alcohol     ___ Return to work/school ________  Special Instructions: No weight on right leg.    My questions have been answered and I understand these instructions. I will adhere to these goals and the provided educational materials after my discharge from the hospital.  Patient/Caregiver Signature _______________________________ Date __________  Clinician Signature _______________________________________ Date __________  Please bring this form and your medication list with you to all your follow-up doctor's appointments. COMMUNITY REFERRALS UPON DISCHARGE:    Outpatient: PT      OT              Agency: [facility]  Phone: [phone]             Appointment Date/Time:*Please expect follow-up within 7-10 business days to schedule your appointment. If you have not received follow-up, be sure to contact the site directly.*   Medical Equipment/Items Ordered:shower chair, wheelchair, and 3in1 [facility]                                                 Agency/Supplier: [employer] [facility] [phone]  GENERAL COMMUNITY RESOURCES FOR PATIENT/FAMILY: If you require  transportation to medical appointments, be sure to call [employer] at [phone].   Be sure to contact [facility] #[phone] to check the status of your application.

## 2024-01-22 NOTE — Progress Notes (Signed)
 Occupational Therapy Session Note  Patient Details  Name: Kylie [name] MRN: [id number] Date of Birth: 03-Jul-1990  Today's Date: 01/22/2024 OT Individual Time: 9199-9182 OT Individual Time Calculation (min): 17 min  and Today's Date: 01/22/2024 OT Missed Time: 13 Minutes Missed Time Reason: Pain;Patient fatigue   Short Term Goals: Week 1:  OT Short Term Goal 1 (Week 1): Pt will complete LB dressing with Mod A OT Short Term Goal 2 (Week 1): Pt will complete functional transfers with CGA OT Short Term Goal 3 (Week 1): Pt will complete toileting hygiene with Min A  Skilled Therapeutic Interventions/Progress Updates:      Therapy Documentation Precautions:  Precautions Precautions: Fall Recall of Precautions/Restrictions: Intact Required Braces or Orthoses: Splint/Cast Splint/Cast: R ankle cast/ace Splint/Cast - Date Prophylactic Dressing Applied (if applicable): 01/08/24 Restrictions Weight Bearing Restrictions Per Provider Order: No RLE Weight Bearing Per Provider Order: Non weight bearing Other Position/Activity Restrictions: NO ROM to R hip/knee General: Pt supine in bed upon OT arrival, agreeable to OT session. Brother present. Pt reporting only getting 2.5-3 hours of sleep last night d/t pain. Pt already having ice applied to ankle. OT offered repositioning for decreased pain, pt declined.   Pain:  8/10 pain reported in Rt ankle, distractions provided for pain management  Other Treatments: OT educating pt on holistic pain management techniques such as distractions, mindfulness, meditation or leisure activities. OT asking pt what she does at home to de-stress and get mind off of things and she was unable to name anything. OT educating pt on activity ideas in order to decrease pain. OT offering ADL tasks and pt declining. OT educating on importance of movement and participating in therapies. Pt declining further intervention d/t increased pain and fatigue.    Pt supine in bed  with bed alarm activated, 2 bed rails up, call light within reach and 4Ps assessed. Brother present   Therapy/Group: Individual Therapy  Camie Hoe, OTD, OTR/L 01/22/2024, 8:30 AM

## 2024-01-22 NOTE — Progress Notes (Signed)
 Occupational Therapy Session Note  Patient Details  Name: Kylie [name] MRN: [id number] Date of Birth: 12/08/1990  Session 1 Today's Date: 01/22/2024 OT Individual Time: 1018-1100 OT Individual Time Calculation (min): 42 min  Session 2 Today's Date: 01/22/2024 OT Individual Time: 1415-1530 OT Individual Time Calculation (min): 75 min   Short Term Goals: Week 1:  OT Short Term Goal 1 (Week 1): Pt will complete LB dressing with Mod A OT Short Term Goal 2 (Week 1): Pt will complete functional transfers with CGA OT Short Term Goal 3 (Week 1): Pt will complete toileting hygiene with Min A  Skilled Therapeutic Interventions/Progress Updates:  Session 1: Skilled OT session completed to address BUE strengthening. Pt received supine in bed, agreeable to participate in therapy; however, declining OOB participation d/t pain. Pt reports 8/10 pain in RLE, pt pre-medicated prior to OT session.   OT demo'd the following HEP for BUE strengthening to support functional transfers and ADLs: - Seated Shoulder Horizontal Abduction with Resistance  - 1 x daily - 7 x weekly - 3 sets - 10 reps - Seated Shoulder Diagonal Pulls with Resistance  - 1 x daily - 7 x weekly - 3 sets - 10 reps - Seated Shoulder Flexion with Self-Anchored Resistance  - 1 x daily - 7 x weekly - 3 sets - 10 reps - Seated Shoulder Extension with Self-Anchored Resistance  - 1 x daily - 7 x weekly - 3 sets - 10 reps - Seated Elbow Flexion with Self-Anchored Resistance  - 1 x daily - 7 x weekly - 3 sets - 10 reps - Seated Elbow Extension with Self-Anchored Resistance  - 1 x daily - 7 x weekly - 3 sets - 15 reps - Seated Shoulder Abduction with Self-Anchored Resistance  - 1 x daily - 7 x weekly - 3 sets - 10 reps Pt returned demo with yellow theraband, required increased rest breaks during task. OT provided VC for proper positioning and technique and modified activity for bed level. Pt completed HEP 2x, OT provided physical copy of HEP in pt  binder. Pt supine in bed with brother present and all needs met.  Session 2: Skilled OT session completed to address ADL retraining and RLE management. Pt received supine, agreeable to participate in therapy. Pt reports manageable pain at this time, pre-medicated prior to OT session.   Pt requested to void, EOB>BSC stand pivot CGA with RW, pt displaying good safety awareness with adherence to NWB precautions. Pt completed toileting hygiene with Mod-I seated at toilet. BSC>EOB stand pivot CGA with RW. Pt declined completing self-care at sink and completed in supine. Pt completed UB bathing and oral care with set-up A. Pt don/doffed UB clothing with Mod-I. OT provided A with ACE wrap at RLE, +2 from LPN to stabilize RLE. Skin assessed outside of ACE wrap, no abnormal skin abrasions noted. Pt declined RLE cleansing without physical A at bed level, OT provided Total A to cleanse skin outside of splint. OT re-wrapped ACE dressing and elevated RLE in supine. Pt educated on only cleansing skin with staff present to manage bandages and skin and not remove splint to reduce AROM/PROM at ankle. Pt verbalized understanding. Pt supine in bed with bed alarm on and brother present with all needs met.  Therapy Documentation Precautions:  Precautions Precautions: Fall Recall of Precautions/Restrictions: Intact Required Braces or Orthoses: Splint/Cast Splint/Cast: R ankle cast/ace Splint/Cast - Date Prophylactic Dressing Applied (if applicable): 01/08/24 Restrictions Weight Bearing Restrictions Per Provider Order: No RLE  Weight Bearing Per Provider Order: Non weight bearing Other Position/Activity Restrictions: NO ROM to R hip/knee  Therapy/Group: Individual Therapy  Talar Fraley Woods-Chance, MS, OTR/L 01/22/2024, 12:59 PM

## 2024-01-22 NOTE — Progress Notes (Signed)
 Physical Therapy Session Note  Patient Details  Name: Kylie [name] MRN: [id number] Date of Birth: May 22, 1990  Today's Date: 01/22/2024 PT Individual Time: [phone] 1137-1202 PT Individual Time Calculation (min): 28 min, 25 min  Short Term Goals: Week 1:  PT Short Term Goal 1 (Week 1): pt will perform bed mobility with min A PT Short Term Goal 2 (Week 1): pt will perform all transfers with LRAD and CGA PT Short Term Goal 3 (Week 1): pt will ambulate 40ft with LRAD and CGA  Skilled Therapeutic Interventions/Progress Updates:      Treatment Session 1  Pt supine in bed upon arrival. Pt endorses increased pain today in R foot. Notified nurse-nurse present to administer medication.  Pt endorses it kept her up throughout the night-only sleeping 3 hours. Pt utilizing ice on R foot at start of session and endorses using it throughout the night.   Pt requesting to use bathroom. Pt performed stand pivot transfer bed to [facility] with RW and close supervision. Pt requesting to use R foot on padded leg rest. PT highly discouraged this for adherence to R LE NWBing precauitons and provided education why this is contraindicated. Pt verbalized understanding and agreeable.   Education provided regarding improtance of knee ROM for improved circulation, healing. Pt unwilling to perform actively until she receives medication but agreeable for PT to perform passively in meantime. PT perform passive R knee SAQ, with pt supine in bed.   Pt supine in bed with all needs within reach and bed alarm on.    Treatment Session 2   Pt supine in bed upon arrival with pt brother in the room. Pt agreeable to therapy. Pt endorses R LE pain, not quantified, premedicated.   Education provided on non medicinal methods for pain management including: ice, repositioning, therex for improved circulation/healing and to reduce overall guarding/muscle stiffness. Pt receptive to this and agreeable to trailing with education and  encouragement from PT.   Followed up with pt and pt brother (present during session) regarding questions/concerns about discharge. PT and pt brother denies any questions/concerns. Education provided for follow up OPPT after pt follows up with MD and restrictions are lifted. Pt verablized understanding and agreeable. Recommended pt purchase a shower chair for stair navigation. Pt requesting we order it here. Notified child psychotherapist. Recommended pt continue practicing stair navigation with shower chair technique during therapy sessions. Pt verbalized understanding and agreeable.   Pt performed 1x10 active assisted LAQ (as tolerated by patient) seated EOB, pt required ++ time between reps, verbal cues provided for pursed lip breathing.   Pt supine in bed at end of session with all needs within reach and bed alarm on.         Therapy Documentation Precautions:  Precautions Precautions: Fall Recall of Precautions/Restrictions: Intact Required Braces or Orthoses: Splint/Cast Splint/Cast: R ankle cast/ace Splint/Cast - Date Prophylactic Dressing Applied (if applicable): 01/08/24 Restrictions Weight Bearing Restrictions Per Provider Order: No RLE Weight Bearing Per Provider Order: Non weight bearing Other Position/Activity Restrictions: NO ROM to R hip/knee  Therapy/Group: Individual Therapy  [facility] Doreene Orris, [place], DPT  01/22/2024, 7:57 AM

## 2024-01-22 NOTE — Progress Notes (Addendum)
 Patient ID: Kylie [name], female   DOB: 08/22/1990, 33 y.o.   MRN: [id number]  SW ordered shower chair with [employer] via parachute for pt to use to help with navigating stairs per PT.  *SW met with pt and pt brother in room informing on above about DME and outpatient. Pt reported concerns about finances and how long disability will take. SW shared this is a process, and contact number for agency will be in discharge instructions. SW offered [facility] services but pt declined stating she would have necessary help. SW shared CAP/DA and will likely have progressed by time of approval, but would still send to ensure she has adequate help, however, pt declined this service as well.   Graeme Jude, MSW, LCSW Office: [phone] Cell: [phone] Fax: [phone]

## 2024-01-22 NOTE — Congregational Nurse Program (Signed)
 Pt was educated on why stool sample was needed and still refused suppository.
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
## 2024-01-22 NOTE — Plan of Care (Signed)
  Problem: Consults Goal: RH GENERAL PATIENT EDUCATION Description: See Patient Education module for education specifics. Outcome: Progressing   Problem: RH BOWEL ELIMINATION Goal: RH STG MANAGE BOWEL WITH ASSISTANCE Description: STG Manage Bowel with mod I Assistance. Outcome: Progressing Goal: RH STG MANAGE BOWEL W/MEDICATION W/ASSISTANCE Description: STG Manage Bowel with Medication with mod I  Assistance. Outcome: Progressing   Problem: RH BLADDER ELIMINATION Goal: RH STG MANAGE BLADDER WITH ASSISTANCE Description: STG Manage Bladder With mod I Assistance Outcome: Progressing Goal: RH STG MANAGE BLADDER WITH MEDICATION WITH ASSISTANCE Description: STG Manage Bladder With Medication With Assistance. Outcome: Progressing   Problem: RH SAFETY Goal: RH STG ADHERE TO SAFETY PRECAUTIONS W/ASSISTANCE/DEVICE Description: STG Adhere to Safety Precautions With cues Assistance/Device. Outcome: Progressing   Problem: RH PAIN MANAGEMENT Goal: RH STG PAIN MANAGED AT OR BELOW PT'S PAIN GOAL Description: Pain < 4 with prns Outcome: Progressing   Problem: RH KNOWLEDGE DEFICIT GENERAL Goal: RH STG INCREASE KNOWLEDGE OF SELF CARE AFTER HOSPITALIZATION Description: Patient and family will be able to manage care at discharge using educational resources independently Outcome: Progressing

## 2024-01-23 ENCOUNTER — Inpatient Hospital Stay (HOSPITAL_COMMUNITY)

## 2024-01-23 ENCOUNTER — Other Ambulatory Visit (HOSPITAL_COMMUNITY): Payer: Self-pay

## 2024-01-23 DIAGNOSIS — T1490XA Injury, unspecified, initial encounter: Secondary | ICD-10-CM | POA: Diagnosis not present

## 2024-01-23 MED ORDER — MUPIROCIN 2 % EX OINT
1.0000 | TOPICAL_OINTMENT | Freq: Two times a day (BID) | CUTANEOUS | 0 refills | Status: DC
  Filled 2024-01-23: qty 44, 22d supply, fill #0

## 2024-01-23 MED ORDER — TIZANIDINE HCL 4 MG PO TABS: 4.0000 mg | ORAL_TABLET | Freq: Three times a day (TID) | ORAL | Status: DC

## 2024-01-23 MED ORDER — NALOXONE HCL 4 MG/0.1ML NA LIQD
NASAL | 0 refills | Status: AC
  Filled 2024-01-23: qty 2, 30d supply, fill #0

## 2024-01-23 MED ORDER — MAGNESIUM GLUCONATE 500 (27 MG) MG PO TABS
250.0000 mg | ORAL_TABLET | Freq: Every day | ORAL | 0 refills | Status: AC
  Filled 2024-01-23: qty 15, 30d supply, fill #0

## 2024-01-23 MED ORDER — LEVOFLOXACIN 750 MG PO TABS
750.0000 mg | ORAL_TABLET | Freq: Every day | ORAL | 0 refills | Status: DC
  Filled 2024-01-23: qty 10, 10d supply, fill #0

## 2024-01-23 MED ORDER — BUSPIRONE HCL 5 MG PO TABS
5.0000 mg | ORAL_TABLET | Freq: Two times a day (BID) | ORAL | 0 refills | Status: DC | PRN
  Filled 2024-01-23: qty 60, 30d supply, fill #0

## 2024-01-23 MED ORDER — TIZANIDINE HCL 4 MG PO TABS
2.0000 mg | ORAL_TABLET | Freq: Four times a day (QID) | ORAL | Status: DC
  Administered 2024-01-23 – 2024-01-26 (×11): 2 mg via ORAL
  Filled 2024-01-23 (×11): qty 1

## 2024-01-23 MED ORDER — APIXABAN 2.5 MG PO TABS
2.5000 mg | ORAL_TABLET | Freq: Two times a day (BID) | ORAL | 0 refills | Status: DC
  Filled 2024-01-23: qty 60, 30d supply, fill #0

## 2024-01-23 MED ORDER — MUPIROCIN 2 % EX OINT: 1.0000 | TOPICAL_OINTMENT | Freq: Two times a day (BID) | CUTANEOUS | 0 refills | Status: DC

## 2024-01-23 MED ORDER — MUPIROCIN 2 % EX OINT
1.0000 | TOPICAL_OINTMENT | Freq: Two times a day (BID) | CUTANEOUS | 0 refills | Status: AC
  Filled 2024-01-23: qty 44, 22d supply, fill #0

## 2024-01-23 MED ORDER — TIZANIDINE HCL 2 MG PO TABS
2.0000 mg | ORAL_TABLET | Freq: Four times a day (QID) | ORAL | 0 refills | Status: DC
  Filled 2024-01-23: qty 120, 30d supply, fill #0

## 2024-01-23 MED ORDER — SENNOSIDES-DOCUSATE SODIUM 8.6-50 MG PO TABS
2.0000 | ORAL_TABLET | Freq: Two times a day (BID) | ORAL | 0 refills | Status: AC
  Filled 2024-01-23: qty 120, 30d supply, fill #0

## 2024-01-23 MED ORDER — TRAMADOL HCL 50 MG PO TABS
50.0000 mg | ORAL_TABLET | Freq: Three times a day (TID) | ORAL | 0 refills | Status: DC
  Filled 2024-01-23: qty 28, 4d supply, fill #0

## 2024-01-23 MED ORDER — METOPROLOL TARTRATE 25 MG PO TABS
25.0000 mg | ORAL_TABLET | Freq: Two times a day (BID) | ORAL | 0 refills | Status: DC
  Filled 2024-01-23: qty 60, 30d supply, fill #0

## 2024-01-23 MED ORDER — METOCLOPRAMIDE HCL 5 MG PO TABS
5.0000 mg | ORAL_TABLET | Freq: Three times a day (TID) | ORAL | 0 refills | Status: AC
  Filled 2024-01-23: qty 45, 15d supply, fill #0

## 2024-01-23 MED ORDER — OXYCODONE HCL 15 MG PO TABS
15.0000 mg | ORAL_TABLET | Freq: Four times a day (QID) | ORAL | 0 refills | Status: DC | PRN
  Filled 2024-01-23: qty 28, 7d supply, fill #0

## 2024-01-23 MED ORDER — CHLORHEXIDINE GLUCONATE 4 % EX SOLN
1.0000 | CUTANEOUS | Status: DC
  Filled 2024-01-23: qty 15

## 2024-01-23 MED ORDER — BUSPIRONE HCL 10 MG PO TABS
5.0000 mg | ORAL_TABLET | Freq: Two times a day (BID) | ORAL | Status: DC | PRN
  Administered 2024-01-23 – 2024-01-25 (×3): 5 mg via ORAL
  Filled 2024-01-23 (×3): qty 1

## 2024-01-23 MED ORDER — GABAPENTIN 300 MG PO CAPS
300.0000 mg | ORAL_CAPSULE | Freq: Three times a day (TID) | ORAL | 0 refills | Status: DC
  Filled 2024-01-23: qty 90, 30d supply, fill #0

## 2024-01-23 MED ORDER — VITAMIN D3 25 MCG PO TABS
4000.0000 [IU] | ORAL_TABLET | Freq: Every day | ORAL | 0 refills | Status: AC
  Filled 2024-01-23: qty 120, 30d supply, fill #0

## 2024-01-23 MED ORDER — LEVOFLOXACIN 750 MG PO TABS
750.0000 mg | ORAL_TABLET | Freq: Every day | ORAL | Status: DC
  Administered 2024-01-23 – 2024-01-26 (×4): 750 mg via ORAL
  Filled 2024-01-23 (×4): qty 1

## 2024-01-23 MED ORDER — APIXABAN 2.5 MG PO TABS
2.5000 mg | ORAL_TABLET | Freq: Two times a day (BID) | ORAL | Status: DC
  Administered 2024-01-23 – 2024-01-26 (×6): 2.5 mg via ORAL
  Filled 2024-01-23 (×6): qty 1

## 2024-01-23 MED ORDER — BACITRACIN ZINC 500 UNIT/GM EX OINT
TOPICAL_OINTMENT | Freq: Every day | CUTANEOUS | 0 refills | Status: AC
  Filled 2024-01-23: qty 28.4, 10d supply, fill #0

## 2024-01-23 MED ORDER — MELATONIN 5 MG PO TABS
10.0000 mg | ORAL_TABLET | Freq: Every day | ORAL | 0 refills | Status: AC
  Filled 2024-01-23: qty 60, 30d supply, fill #0

## 2024-01-23 MED ORDER — POLYETHYLENE GLYCOL 3350 17 GM/SCOOP PO POWD
34.0000 g | Freq: Every day | ORAL | 0 refills | Status: AC
  Filled 2024-01-23: qty 238, 7d supply, fill #0
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
## 2024-01-23 NOTE — Progress Notes (Signed)
 Occupational Therapy Session Note  Patient Details  Name: Kylie [name] MRN: [id number] Date of Birth: April 14, 1990  Session 1: Today's Date: 01/23/2024 OT Individual Time: 1005-1100 OT Individual Time Calculation (min): 55 min  Session 2: OT missed time: 45 minutes  Missed time reason: See below   Short Term Goals: Week 1:  OT Short Term Goal 1 (Week 1): Pt will complete LB dressing with Mod A OT Short Term Goal 2 (Week 1): Pt will complete functional transfers with CGA OT Short Term Goal 3 (Week 1): Pt will complete toileting hygiene with Min A  Skilled Therapeutic Interventions/Progress Updates:  Session 1: Skilled OT session completed to address ADL retraining and family education. Pt received supine in bed, agreeable to participate in therapy. Pt reports 8/10 pain in RLE, OT provided pain intervention by repositioning and ice at ankle.   Pt reporting ortho visit prior to OT session with no fiberglass cast at RLE, OT reinforces NWB precautions even with new cast; pt and mother verbalized understanding. Supine>EOB with Min A to support RLE. Pt seated EOB reporting increased pain, OT prompted pt for rest break. EOB>WC stand pivot Min A +2, OT providing support at RLE, mother providing support at trunk. OT provided education on proper body mechanics when providing A with standing and support RLE. Pt expresses she is currently limited by pain, but would attempt to try without physical A next transfer. Pt self-propels to sink to complete oral care and UB bathing with set-up A. Pt self-propelled back to bed, WC>EOB stand pivot with RW CGA. Pt adhering to previous cues and completing transfers with proper body mechanics. Pt returned to supine. OT demos covering for shower with mom, pt verbalizes step for secure coverage to A with directing care for mom and brother. Pt supine in bed with all needs in reach.  Session 2: Missed time reason: Pt reporting nausea and fatigue from previous session, RN  notified reporting pt received medication for symptoms prior to OT session. Pt continues to decline tx, OT to make up time when able.     Therapy Documentation Precautions:  Precautions Precautions: Fall Recall of Precautions/Restrictions: Intact Required Braces or Orthoses: Splint/Cast Splint/Cast: R ankle cast/ace Splint/Cast - Date Prophylactic Dressing Applied (if applicable): 01/08/24 Restrictions Weight Bearing Restrictions Per Provider Order: Yes RLE Weight Bearing Per Provider Order: Non weight bearing Other Position/Activity Restrictions: NO ROM to R hip/knee   Therapy/Group: Individual Therapy  Aidaly Cordner Woods-Chance, MS, OTR/L 01/23/2024, 7:55 AM

## 2024-01-23 NOTE — Progress Notes (Signed)
 Physical Therapy Discharge Summary  Patient Details  Name: Kylie [name] MRN: [id number] Date of Birth: May 30, 1990  Date of Discharge from PT service:January 25, 2024  Patient has met 5 of 9 long term goals due to improved activity tolerance, improved balance, improved postural control, increased strength, increased range of motion, ability to compensate for deficits, and improved coordination. Patient to discharge at a wheelchair level Supervision. Patient's care partner is independent to provide the necessary physical assistance at discharge.  Reasons goals not met: Pt limited by decreased balance/coordination due to RLE NWB precautions and high pain levels impacting pt's participation in therapy. Pt frequently declined OOB mobility due to pain.   Recommendation:  Patient will benefit from ongoing skilled PT services in outpatient setting to continue to advance safe functional mobility, address ongoing impairments in transfers, generalized strengthening and endurance, dynamic standing balance/coordination NMR, ambulation, and to minimize fall risk.  Equipment: 18x18 manual WC with bilateral elevating legrests, shower chair for stair navigation   Reasons for discharge: treatment goals met and discharge from hospital  Patient/family agrees with progress made and goals achieved: Yes  PT Discharge Precautions/Restrictions Precautions Precautions: Fall Required Braces or Orthoses: Splint/Cast Splint/Cast: R ankle cast/ace Splint/Cast - Date Prophylactic Dressing Applied (if applicable): 01/08/24 Restrictions Weight Bearing Restrictions Per Provider Order: Yes RLE Weight Bearing Per Provider Order: Non weight bearing Pain Interference Pain Interference Pain Effect on Sleep: 4. Almost constantly Pain Interference with Therapy Activities: 3. Frequently Pain Interference with Day-to-Day Activities: 2. Occasionally Cognition Overall Cognitive Status: Within Functional Limits for tasks  assessed Arousal/Alertness: Awake/alert Orientation Level: Oriented X4 Memory: Appears intact Awareness: Appears intact Problem Solving: Appears intact Safety/Judgment: Appears intact Sensation Sensation Light Touch: Impaired Detail Light Touch Impaired Details: Impaired RLE Hot/Cold: Not tested Proprioception: Appears Intact Additional Comments: limited by splint on RLE, pt reporting numbness/tingling/burning on R ankle radiating into toes Coordination Gross Motor Movements are Fluid and Coordinated: No Fine Motor Movements are Fluid and Coordinated: No Coordination and Movement Description: altered balance strategies due to RLE NWB precautions, pain, and weakness/deconditioning Finger Nose Finger Test: [facility] bilaterally Heel Shin Test: not tested due to pain Motor  Motor Motor: Abnormal postural alignment and control Motor - Skilled Clinical Observations: altered balance strategies due to RLE NWB precautions and pain  Mobility Bed Mobility Bed Mobility: Rolling Right;Rolling Left;Sit to Supine;Supine to Sit Rolling Right: Independent with assistive device Rolling Left: Independent with assistive device Supine to Sit: Independent with assistive device Sit to Supine: Independent with assistive device Transfers Transfers: Sit to Stand;Stand to Sit;Stand Pivot Transfers Sit to Stand: Independent with assistive device Stand to Sit: Independent with assistive device Stand Pivot Transfers: Independent with assistive device Transfer (Assistive device): Rolling walker Locomotion  Gait Ambulation: Yes Gait Assistance: Supervision/Verbal cueing Gait Distance (Feet): 25 Feet Assistive device: Rolling walker Gait Gait: Yes Gait Pattern: Impaired (hop to) Gait velocity: decreased Stairs / Additional Locomotion Stairs: Yes Stairs Assistance: Minimal Assistance - Patient > 75% Stair Management Technique: One rail Right;Other (comment) (shower chair) Number of Stairs: 3 Height of  Stairs: 6 Wheelchair Mobility Wheelchair Mobility: Yes Wheelchair Assistance: Doctor, General Practice: Both upper extremities Wheelchair Parts Management: Needs assistance Distance: >174ft  Trunk/Postural Assessment  Cervical Assessment Cervical Assessment: Within Functional Limits Thoracic Assessment Thoracic Assessment: Within Functional Limits Lumbar Assessment Lumbar Assessment: Within Functional Limits Postural Control Postural Control: Deficits on evaluation Righting Reactions: slightly delayed on R Protective Responses: slightly delayed on R  Balance Balance Balance Assessed: Yes Static Sitting  Balance Static Sitting - Balance Support: Feet supported;Bilateral upper extremity supported Static Sitting - Level of Assistance: 7: Independent Dynamic Sitting Balance Dynamic Sitting - Balance Support: Feet supported;No upper extremity supported Dynamic Sitting - Level of Assistance: 6: Modified independent (Device/Increase time) Static Standing Balance Static Standing - Balance Support: Bilateral upper extremity supported;During functional activity (RW) Static Standing - Level of Assistance: 5: Stand by assistance (supervision) Dynamic Standing Balance Dynamic Standing - Balance Support: Bilateral upper extremity supported;During functional activity (RW) Dynamic Standing - Level of Assistance: 5: Stand by assistance (supervision) Dynamic Standing - Comments: with transfers and ambulation Extremity Assessment  RLE Assessment RLE Assessment: Not tested General Strength Comments: not tested due to pain LLE Assessment LLE Assessment: Within Functional Limits  Therisa HERO Zaunegger Therisa Stains PT, DPT 01/23/2024, 11:48 AM

## 2024-01-23 NOTE — Progress Notes (Signed)
 Orthopedic Tech Progress Note Patient Details:  Kylie [name] [facility] [birth year] [id number] Assisted P.A. Paul with application of short leg cast. Casting Type of Cast: Short leg cast Cast Location: RLE Cast Material: Fiberglass Cast Intervention: Application  Post Interventions Patient Tolerated: Fair   Kylie [name] 01/23/2024, 10:11 AM
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
## 2024-01-23 NOTE — Progress Notes (Signed)
 Physical Therapy Session Note  Patient Details  Name: Kylie [name] MRN: [id number] Date of Birth: 13-May-1990  Today's Date: 01/23/2024 PT Individual Time: [phone] and 1401-1440 PT Individual Time Calculation (min): 24 min and 39 min PT Missed Time: 21 minutes  PT Missed Time Reason: Pain  Short Term Goals: Week 1:  PT Short Term Goal 1 (Week 1): pt will perform bed mobility with min A PT Short Term Goal 2 (Week 1): pt will perform all transfers with LRAD and CGA PT Short Term Goal 3 (Week 1): pt will ambulate 80ft with LRAD and CGA  Skilled Therapeutic Interventions/Progress Updates:   Treatment Session 1 Received pt in bed screaming out get my nurse and crying in pain. Pt reporting 10/10 pain in R lower leg - provided pt with ice pack for pain relief and located RN. While RN administered medications, discussed pt's major concern regarding discharge - pain management. Also discussed pt's anxiety with fear of movement due to pain. Secure chatted MD to discuss anxiety medications and at home pain management. Discussed deep breathing techniques as well as using mental imagery. Went through sensation, MMT, and pain interference questionnaire in preparation for discharge. Pt politely refused OOB mobility due to pain and left in bed with all needs within reach and mother at bedside.   Treatment Session 2 Received pt semi-reclined in bed with mother at bedside. Pt reporting 10/10 pain in R lower leg after receiving new short cast. Pt reports they hung her by her toes while donning new cast and now she is experiencing increased pain. Pt refused OOB mobility despite encouragement and verbalized confidence with all tasks to prepare for discharge Monday including stair navigation using shower chair. Discussed getting bed assist rail for home use for ease with bed mobility and provided pt with handout.   Provided pt with HEP and educated on frequency/duration/technique for the following  exercises: - Supine Active Straight Leg Raise  - 1 x daily - 7 x weekly - 3 sets - 10 reps - Supine Hip Abduction  - 1 x daily - 7 x weekly - 3 sets - 10 reps - Supine Straight Leg Hip Adduction and Quad Set with Ball  - 1 x daily - 7 x weekly - 3 sets - 10 reps - Supine Heel Slide  - 1 x daily - 7 x weekly - 3 sets - 10 reps - Supine Gluteal Sets  - 1 x daily - 7 x weekly - 3 sets - 10 reps - Supine Pelvic Tilt  - 1 x daily - 7 x weekly - 3 sets - 10 reps Pt politely refused practicing bed level exercises but verbalized importance of mobility to maintain strength and prevent blood clots. Provided pt with Halloween candy to uplift mood/spirits and repositioned ice pack on R ankle. Concluded session with pt semi-reclined in bed, needs within reach, and bed alarm on. 21 minutes missed of skilled physical therapy due to pain.   Therapy Documentation Precautions:  Precautions Precautions: Fall Recall of Precautions/Restrictions: Intact Required Braces or Orthoses: Splint/Cast Splint/Cast: R ankle cast/ace Splint/Cast - Date Prophylactic Dressing Applied (if applicable): 01/08/24 Restrictions Weight Bearing Restrictions Per Provider Order: Yes RLE Weight Bearing Per Provider Order: Non weight bearing Other Position/Activity Restrictions: NO ROM to R hip/knee  Therapy/Group: Individual Therapy Therisa HERO Zaunegger Therisa Stains PT, DPT 01/23/2024, 6:50 AM

## 2024-01-24 ENCOUNTER — Other Ambulatory Visit (HOSPITAL_COMMUNITY): Payer: Self-pay

## 2024-01-24 DIAGNOSIS — T1490XA Injury, unspecified, initial encounter: Secondary | ICD-10-CM | POA: Diagnosis not present

## 2024-01-24 DIAGNOSIS — K5901 Slow transit constipation: Secondary | ICD-10-CM | POA: Diagnosis not present

## 2024-01-24 MED ORDER — LEVOFLOXACIN 750 MG PO TABS
750.0000 mg | ORAL_TABLET | Freq: Every day | ORAL | 0 refills | Status: AC
  Filled 2024-01-24: qty 10, 10d supply, fill #0

## 2024-01-24 NOTE — Progress Notes (Signed)
 Occupational Therapy Session Note  Patient Details  Name: Kylie [name] MRN: [id number] Date of Birth: 02/21/1991  Today's Date: 01/24/2024 OT Individual Time: 1401-1445 OT Individual Time Calculation (min): 44 min    Short Term Goals: Week 1:  OT Short Term Goal 1 (Week 1): Pt will complete LB dressing with Mod A OT Short Term Goal 2 (Week 1): Pt will complete functional transfers with CGA OT Short Term Goal 3 (Week 1): Pt will complete toileting hygiene with Min A  Skilled Therapeutic Interventions/Progress Updates:  Skilled OT session completed to address ADL retraining and functional transfers. Pt received supine in bed, agreeable to participate in therapy. Pt reports no pain at arrival; however, reporting 8/10 pain in RLE during activity. OT provided pain intervention with ice and elevation.   Supine>EOB Mod-I with HOB elevated; pt requiring increased time to reach EOB supporting RLE with BUE. Seated EOB, pt completed LB dressing with SBA standing intermittently at RW to don/doff pants. OT and pt collaborate on task in home environment to conserve energy and reduce pain. Pt will complete dressing with family present to reduce risk for falls; however will attempt to complete without phsyical A to increase independence with BADLs. EOB>WC stand pivot with RW, SBA. Pt displaying difficulty managing leg rests and adhering to WB precautions. OT provides A to place leg rests on WC. WC><TTB ambulating with RW CGA, pt displaying good safety awareness during transfer by adhering to NWB precautions. Transfer completed, pt reporting pain in RLE. WC>EOB stand pivot with RW SBA. Pt required A to support RLE to return to supine. OT provided pain mgmt listed above, pt requested pain medication from nursing. Pt supine in bed with nurse present and all needs within reach.  Therapy Documentation Precautions:  Precautions Precautions: Fall Recall of Precautions/Restrictions: Intact Required Braces or  Orthoses: Splint/Cast Splint/Cast: R ankle cast/ace Splint/Cast - Date Prophylactic Dressing Applied (if applicable): 01/08/24 Restrictions Weight Bearing Restrictions Per Provider Order: Yes RLE Weight Bearing Per Provider Order: Non weight bearing Other Position/Activity Restrictions: NO ROM to R hip/knee   Therapy/Group: Individual Therapy  Antwan Bribiesca Woods-Chance, MS, OTR/L 01/24/2024, 7:53 AM
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
## 2024-01-24 NOTE — Progress Notes (Signed)
 Occupational Therapy Discharge Summary  Patient Details  Name: Kylie [name] MRN: [id number] Date of Birth: 26-Jul-1990  Date of Discharge from OT service:January 25, 2024  {CHL IP REHAB OT TIME CALCULATIONS:[id number]}   Patient has met {NUMBERS 0-12:18577} of {NUMBERS 0-12:18577} long term goals due to improved activity tolerance, improved balance, postural control, ability to compensate for deficits, improved awareness, and improved coordination.  Patient to discharge at overall Modified Independent level.  Patient's care partner is independent to provide the necessary physical assistance at discharge.  Patient and family received hands-on transfer training and education of current status prior to discharge and are will support in home environment.   Reasons goals not met: N/A  Recommendation:  Patient will benefit from ongoing skilled OT services in outpatient setting to continue to advance functional skills in the area of BADL and iADL.  Equipment: BSC  Reasons for discharge: treatment goals met and discharge from hospital  Patient/family agrees with progress made and goals achieved: Yes  OT Discharge Precautions/Restrictions  Precautions Precautions: Fall Recall of Precautions/Restrictions: Intact Required Braces or Orthoses: Splint/Cast Splint/Cast: R ankle cast/ace Splint/Cast - Date Prophylactic Dressing Applied (if applicable): 01/23/24 Restrictions Weight Bearing Restrictions Per Provider Order: Yes RLE Weight Bearing Per Provider Order: Non weight bearing  ADL ADL Eating: Independent Where Assessed-Eating: Bed level, Wheelchair Grooming: Independent Where Assessed-Grooming: Wheelchair Upper Body Bathing: Independent Where Assessed-Upper Body Bathing: Shower, Wheelchair Lower Body Bathing: Modified independent Where Assessed-Lower Body BathingBiomedical Scientist, Careers Adviser Dressing: Modified independent (Device) Where Assessed-Upper Body Dressing:  Wheelchair Lower Body Dressing: Modified independent Where Assessed-Lower Body Dressing: Edge of bed Toileting: Modified independent Where Assessed-Toileting: Bedside Commode, Teacher, Adult Education: Modified independent Statistician Method: Ambulating, Stand pivot Acupuncturist: Gaffer: Not assessed Film/video Editor: Modified independent Film/video Editor Method: Stand pivot, Designer, Industrial/product: Information systems manager with back Vision Baseline Vision/History: 1 Wears glasses Patient Visual Report: No change from baseline Vision Assessment?: No apparent visual deficits Perception  Perception: Within Functional Limits Praxis Praxis: WFL Cognition Cognition Overall Cognitive Status: Within Functional Limits for tasks assessed Arousal/Alertness: Awake/alert Orientation Level: Person;Place;Situation Person: Oriented Place: Oriented Situation: Oriented Memory: Appears intact Awareness: Appears intact Problem Solving: Appears intact Safety/Judgment: Appears intact Sensation Sensation Light Touch: Impaired Detail Light Touch Impaired Details: Impaired RLE Hot/Cold: Not tested Proprioception: Appears Intact Stereognosis: Not tested Additional Comments: limited by splint on RLE, pt reporting numbness/tingling/burning on R ankle radiating into toes Coordination Gross Motor Movements are Fluid and Coordinated: No Fine Motor Movements are Fluid and Coordinated: No Coordination and Movement Description: altered balance strategies due to RLE NWB precautions, pain, and weakness/deconditioning Finger Nose Finger Test: [facility] bilaterally Heel Shin Test: not tested due to pain Motor  Motor Motor: Abnormal postural alignment and control Motor - Skilled Clinical Observations: altered balance strategies due to RLE NWB precautions and pain Mobility  Bed Mobility Bed Mobility: Rolling Right;Rolling Left;Sit to Supine;Supine to  Sit Rolling Right: Independent with assistive device Rolling Left: Independent with assistive device Supine to Sit: Independent with assistive device Sit to Supine: Independent with assistive device Transfers Sit to Stand: Independent with assistive device Stand to Sit: Independent with assistive device  Trunk/Postural Assessment  Cervical Assessment Cervical Assessment: Within Functional Limits Thoracic Assessment Thoracic Assessment: Within Functional Limits Lumbar Assessment Lumbar Assessment: Within Functional Limits Postural Control Postural Control: Deficits on evaluation Righting Reactions: slightly delayed on R Protective Responses: slightly delayed on R  Balance Balance Balance Assessed: Yes Static Sitting  Balance Static Sitting - Balance Support: Feet supported;Bilateral upper extremity supported Static Sitting - Level of Assistance: 7: Independent Dynamic Sitting Balance Dynamic Sitting - Balance Support: Feet supported;No upper extremity supported Dynamic Sitting - Level of Assistance: 6: Modified independent (Device/Increase time) Static Standing Balance Static Standing - Balance Support: Bilateral upper extremity supported;During functional activity Static Standing - Level of Assistance: 6: Modified independent (Device/Increase time) Dynamic Standing Balance Dynamic Standing - Balance Support: Bilateral upper extremity supported;During functional activity Dynamic Standing - Level of Assistance: 6: Modified independent (Device/Increase time) Extremity/Trunk Assessment RUE Assessment RUE Assessment: Within Functional Limits General Strength Comments: 5/5 LUE Assessment LUE Assessment: Within Functional Limits General Strength Comments: 5/5   Romi Rathel T Woods-Chance 01/24/2024, 5:51 PM
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
## 2024-01-25 DIAGNOSIS — K5901 Slow transit constipation: Secondary | ICD-10-CM | POA: Diagnosis not present

## 2024-01-25 DIAGNOSIS — T1490XA Injury, unspecified, initial encounter: Secondary | ICD-10-CM | POA: Diagnosis not present

## 2024-01-25 MED ORDER — ASPIRIN 81 MG PO TBEC
81.0000 mg | DELAYED_RELEASE_TABLET | Freq: Two times a day (BID) | ORAL | 0 refills | Status: DC
  Filled 2024-01-25: qty 60, 30d supply, fill #0

## 2024-01-25 NOTE — Plan of Care (Signed)
  Problem: RH Balance Goal: LTG: Patient will maintain dynamic sitting balance (OT) Description: LTG:  Patient will maintain dynamic sitting balance with assistance during activities of daily living (OT) Outcome: Completed/Met   Problem: Sit to Stand Goal: LTG:  Patient will perform sit to stand in prep for activites of daily living with assistance level (OT) Description: LTG:  Patient will perform sit to stand in prep for activites of daily living with assistance level (OT) Outcome: Completed/Met   Problem: RH Eating Goal: LTG Patient will perform eating w/assist, cues/equip (OT) Description: LTG: Patient will perform eating with assist, with/without cues using equipment (OT) Outcome: Completed/Met   Problem: RH Grooming Goal: LTG Patient will perform grooming w/assist,cues/equip (OT) Description: LTG: Patient will perform grooming with assist, with/without cues using equipment (OT) Outcome: Completed/Met   Problem: RH Bathing Goal: LTG Patient will bathe all body parts with assist levels (OT) Description: LTG: Patient will bathe all body parts with assist levels (OT) Outcome: Completed/Met   Problem: RH Dressing Goal: LTG Patient will perform upper body dressing (OT) Description: LTG Patient will perform upper body dressing with assist, with/without cues (OT). Outcome: Completed/Met Goal: LTG Patient will perform lower body dressing w/assist (OT) Description: LTG: Patient will perform lower body dressing with assist, with/without cues in positioning using equipment (OT) Outcome: Completed/Met   Problem: RH Toileting Goal: LTG Patient will perform toileting task (3/3 steps) with assistance level (OT) Description: LTG: Patient will perform toileting task (3/3 steps) with assistance level (OT)  Outcome: Completed/Met   Problem: RH Simple Meal Prep Goal: LTG Patient will perform simple meal prep w/assist (OT) Description: LTG: Patient will perform simple meal prep with  assistance, with/without cues (OT). Outcome: Completed/Met   Problem: RH Laundry Goal: LTG Patient will perform laundry w/assist, cues (OT) Description: LTG: Patient will perform laundry with assistance, with/without cues (OT). Outcome: Completed/Met   Problem: RH Light Housekeeping Goal: LTG Patient will perform light housekeeping w/assist (OT) Description: LTG: Patient will perform light housekeeping with assistance, with/without cues (OT). Outcome: Completed/Met   Problem: RH Toilet Transfers Goal: LTG Patient will perform toilet transfers w/assist (OT) Description: LTG: Patient will perform toilet transfers with assist, with/without cues using equipment (OT) Outcome: Completed/Met   Problem: RH Tub/Shower Transfers Goal: LTG Patient will perform tub/shower transfers w/assist (OT) Description: LTG: Patient will perform tub/shower transfers with assist, with/without cues using equipment (OT) Outcome: Completed/Met

## 2024-01-25 NOTE — Progress Notes (Incomplete)
 Physical Therapy Discharge Summary  Patient Details  Name: ARIEL DIMITRI MRN: [id number] Date of Birth: 1991/03/04  Date of Discharge from PT service:{Time; dates multiple:304500300}  {CHL IP REHAB PT TIME CALCULATION:304800500}   Patient has met {NUMBERS 0-12:18577} of {NUMBERS 0-12:18577} long term goals due to {due un:6958322}.  Patient to discharge at [facility] level {LOA:3049010}.   Patient's care partner {care partner:3041650} to provide the necessary {assistance:3041652} assistance at discharge.  Reasons goals not met: ***  Recommendation:  Patient will benefit from ongoing skilled PT services in {setting:3041680} to continue to advance safe functional mobility, address ongoing impairments in ***, and minimize fall risk.  Equipment: {equipment:3041657}  Reasons for discharge: {Reason for discharge:3049018}  Patient/family agrees with progress made and goals achieved: {Pt/Family agree with progress/goals:3049020}  PT Discharge Precautions/Restrictions Precautions Precautions: Fall Recall of Precautions/Restrictions: Intact Splint/Cast: R ankle cast/ace Restrictions Weight Bearing Restrictions Per Provider Order: Yes RLE Weight Bearing Per Provider Order: Non weight bearing Vital Signs   Pain Pain Assessment Pain Scale: 0-10 Pain Score: 6  Pain Type: Acute pain Pain Location: Foot Pain Orientation: Right Pain Descriptors / Indicators: Aching Pain Onset: On-going Patients Stated Pain Goal: 0 Pain Intervention(s): Repositioned Multiple Pain Sites: No Pain Interference Pain Interference Pain Effect on Sleep: 2. Occasionally Pain Interference with Therapy Activities: 3. Frequently Pain Interference with Day-to-Day Activities: 2. Occasionally Vision/Perception  Vision - History Ability to See in Adequate Light: 0 Adequate  Cognition Overall Cognitive Status: Within Functional Limits for tasks assessed Arousal/Alertness: Awake/alert Orientation Level:  Oriented X4 Sensation Sensation Light Touch: Impaired by gross assessment Light Touch Impaired Details: Impaired RLE Motor  Motor Motor: Abnormal postural alignment and control Motor - Discharge Observations: altered balance strategies due to RLE NWB precautions and pain  Mobility Bed Mobility Bed Mobility: Rolling Right;Rolling Left;Sit to Supine;Supine to Sit Rolling Right: Independent with assistive device Rolling Left: Independent with assistive device Supine to Sit: Independent with assistive device Sit to Supine: Independent with assistive device Transfers Transfers: Sit to Stand;Stand to Sit;Stand Pivot Transfers Sit to Stand: Independent with assistive device Stand to Sit: Independent with assistive device Stand Pivot Transfers: Independent with assistive device Transfer (Assistive device): Rolling walker Locomotion  Gait Ambulation: Yes Gait Assistance: Supervision/Verbal cueing Gait Distance (Feet): 25 Feet Assistive device: Rolling walker Gait Gait: Yes Gait Pattern: Impaired Gait velocity: decreased Stairs / Additional Locomotion Stairs: Yes Stairs Assistance: Minimal Assistance - Patient > 75% Stair Management Technique: One rail Right;Other (comment) Number of Stairs: 4 Height of Stairs: 6 Pick up small object from the floor (from standing position) activity did not occur: Refused (pain)  Trunk/Postural Assessment  Cervical Assessment Cervical Assessment: Within Functional Limits Thoracic Assessment Thoracic Assessment: Within Functional Limits Lumbar Assessment Lumbar Assessment: Within Functional Limits Postural Control Postural Control: Deficits on evaluation Righting Reactions: slightly delayed on R Protective Responses: slightly delayed on R  Balance Static Sitting Balance Static Sitting - Balance Support: Feet supported;Bilateral upper extremity supported Static Sitting - Level of Assistance: 7: Independent Dynamic Sitting Balance Dynamic  Sitting - Balance Support: Feet supported;No upper extremity supported Dynamic Sitting - Level of Assistance: 6: Modified independent (Device/Increase time) Static Standing Balance Static Standing - Balance Support: Bilateral upper extremity supported;During functional activity Static Standing - Level of Assistance: 6: Modified independent (Device/Increase time) Dynamic Standing Balance Dynamic Standing - Balance Support: Bilateral upper extremity supported;During functional activity Dynamic Standing - Level of Assistance: 6: Modified independent (Device/Increase time) Extremity Assessment        LLE Assessment LLE  Assessment: Within Functional Limits   Warrick KANDICE Raspberry 01/25/2024, 10:35 AM

## 2024-01-25 NOTE — Progress Notes (Signed)
 Inpatient Rehabilitation Discharge Medication Review by a Pharmacist  A complete drug regimen review was completed for this patient to identify any potential clinically significant medication issues.  High Risk Drug Classes Is patient taking? Indication by Medication  Antipsychotic No   Anticoagulant Yes Apixaban - dvt prophylaxis  Antibiotic Yes Levofloxacin - wound infection  Opioid Yes Oxycodone , tramadol  - pain  Antiplatelet No   Hypoglycemics/insulin No   Vasoactive Medication Yes Metoprolol -HTN  Chemotherapy No   Other Yes Bacitracin, mupirocin, CHG - wound care Buspirone - mood Gabapentin, tizanidine - pain Magnesium, melatonin - sleep Metoclopramide  - nausea Naloxone - opioid overdose Polyethylene Glycol, senna-docusate - constipation Vitamin D - supplement     Type of Medication Issue Identified Description of Issue Recommendation(s)  Drug Interaction(s) (clinically significant)     Duplicate Therapy     Allergy     No Medication Administration End Date     Incorrect Dose     Additional Drug Therapy Needed     Significant med changes from prior encounter (inform family/care partners about these prior to discharge). All medications new start besides wound care meds and propranolol -> metoprolol   Other       Clinically significant medication issues were identified that warrant physician communication and completion of prescribed/recommended actions by midnight of the next day:  No  Name of provider notified for urgent issues identified:   Provider Method of Notification:     Pharmacist comments:   Time spent performing this drug regimen review (minutes):  30   Larraine Brazier, PharmD Clinical Pharmacist 01/25/2024  3:26 PM **Pharmacist phone directory can now be found on [email or web address] ([email or web address]).  Listed under [facility].

## 2024-01-25 NOTE — Progress Notes (Signed)
 Occupational Therapy Session Note  Patient Details  Name: Kylie [name] MRN: [id number] Date of Birth: 04-Jul-1990  Today's Date: 01/25/2024 OT Individual Time: 8740-8656 OT Individual Time Calculation (min): 44 min    Short Term Goals: Week 1:   STG+LTG d/t ELOS  Skilled Therapeutic Interventions/Progress Updates:    Patient agreeable to participate in OT session. Reports 5/10 pain level, being premedicated with well controlled well timed medication.   Patient participated in skilled OT session focusing on ADLs with compliance to WB precaution, discharge planning, functional mobility, education on showering. Patient received in good spirits in bed. Patient able to complete LB dressing mod Iin bed with good skill one verbal cue for precautions. Patient able to complete single leg bridging exercises to  increase core, glute strength, and quad strength. Patient then complete functional mobility to edge of room mod I to demonstrate ability to complete laundry tasks. Patient educated on showering and provided all necessities for wrapping cast. Patient educated on [facility], OT answered all questions. Patient left in bed all needs in reach alarm on.   Therapy Documentation Precautions:  Precautions Precautions: Fall Recall of Precautions/Restrictions: Intact Required Braces or Orthoses: Splint/Cast Splint/Cast: R ankle cast/ace Splint/Cast - Date Prophylactic Dressing Applied (if applicable): 01/23/24 Restrictions Weight Bearing Restrictions Per Provider Order: Yes RLE Weight Bearing Per Provider Order: Non weight bearing Other Position/Activity Restrictions: NO ROM to R hip/knee Therapy/Group: Individual Therapy  D'mariea L Mckayla Mulcahey 01/25/2024, 8:00 AM

## 2024-01-25 NOTE — Progress Notes (Signed)
 Physical Therapy Session Note  Patient Details  Name: Kylie [name] MRN: [id number] Date of Birth: 29-Dec-1990  Today's Date: 01/25/2024 PT Individual Time: 1000-1055  PT Individual Time Calculation (min): 55 min  Short Term Goals: Week 1:  PT Short Term Goal 1 (Week 1): pt will perform bed mobility with min A PT Short Term Goal 2 (Week 1): pt will perform all transfers with LRAD and CGA PT Short Term Goal 3 (Week 1): pt will ambulate 72ft with LRAD and CGA  Skilled Therapeutic Interventions/Progress Updates:  Chart reviewed and pt agreeable to therapy. Pt received semi-reclined in bed with 6/10 c/o pain in R foot. Session focused on functional transfers, balance, ambulation, and evaluation of functional mobility to promote safe home mobility and access and preparation for pending d/c. Pt initiated session with review of physical mobility as noted in d/c note. Pt then amb around room for 25 ft using S + RW. Pt then sat EOB and completed self-care using S. Pt also sat with no back support and distant supervision to sort and organize items to prepare for pending discharge.  PT and pt also discussed continued therapy with pt verbalizing understanding of need for continued PT. At end of session, pt was left semi-reclined in bed with alarm engaged, nurse call bell and all needs in reach.     Therapy Documentation Precautions:  Precautions Precautions: Fall Recall of Precautions/Restrictions: Intact Required Braces or Orthoses: Splint/Cast Splint/Cast: R ankle cast/ace Splint/Cast - Date Prophylactic Dressing Applied (if applicable): 01/23/24 Restrictions Weight Bearing Restrictions Per Provider Order: Yes RLE Weight Bearing Per Provider Order: Non weight bearing Other Position/Activity Restrictions: NO ROM to R hip/knee General:      Therapy/Group: Individual Therapy   Warrick KANDICE Raspberry 01/25/2024, 11:01 AM
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
## 2024-01-26 ENCOUNTER — Other Ambulatory Visit (HOSPITAL_COMMUNITY): Payer: Self-pay

## 2024-01-26 DIAGNOSIS — T1490XA Injury, unspecified, initial encounter: Secondary | ICD-10-CM | POA: Diagnosis not present

## 2024-01-26 LAB — CBC
HCT: 31.5 % — ABNORMAL LOW (ref 36.0–46.0)
Hemoglobin: 10.3 g/dL — ABNORMAL LOW (ref 12.0–15.0)
MCH: 28.9 pg (ref 26.0–34.0)
MCHC: 32.7 g/dL (ref 30.0–36.0)
MCV: 88.5 fL (ref 80.0–100.0)
Platelets: 330 K/uL (ref 150–400)
RBC: 3.56 MIL/uL — ABNORMAL LOW (ref 3.87–5.11)
RDW: 13.8 % (ref 11.5–15.5)
WBC: 3.6 K/uL — ABNORMAL LOW (ref 4.0–10.5)
nRBC: 0 % (ref 0.0–0.2)

## 2024-01-26 LAB — BASIC METABOLIC PANEL WITH GFR
Anion gap: 14 (ref 5–15)
BUN: 7 mg/dL (ref 6–20)
CO2: 27 mmol/L (ref 22–32)
Calcium: 9.2 mg/dL (ref 8.9–10.3)
Chloride: 95 mmol/L — ABNORMAL LOW (ref 98–111)
Creatinine, Ser: 0.66 mg/dL (ref 0.44–1.00)
GFR, Estimated: >60 mL/min (ref >60)
Glucose, Bld: 97 mg/dL (ref 70–99)
Potassium: 3.8 mmol/L (ref 3.5–5.1)
Sodium: 136 mmol/L (ref 135–145)

## 2024-01-26 MED ORDER — TIZANIDINE HCL 4 MG PO TABS
4.0000 mg | ORAL_TABLET | Freq: Four times a day (QID) | ORAL | Status: DC
  Administered 2024-01-26: 4 mg via ORAL
  Filled 2024-01-26: qty 1

## 2024-01-26 MED ORDER — TIZANIDINE HCL 4 MG PO TABS
4.0000 mg | ORAL_TABLET | Freq: Four times a day (QID) | ORAL | 0 refills | Status: DC
  Filled 2024-01-26: qty 120, 30d supply, fill #0
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
## 2024-01-26 NOTE — Progress Notes (Deleted)
  Cardiology Office Note   Date:  01/26/2024  ID:  JAMESE TRAUGER, DOB [birth year], MRN [id number] PCP: [facility]  [facility] Providers Cardiologist:  None { Click to update primary MD,subspecialty MD or APP then REFRESH:1}    History of Present Illness AISA SCHOEPPNER is a 33 y.o. female no relevant PMH who presents for further evaluation and management of chest pain.  Patient was seen in the ED on 12/09/2023 for chest discomfort.  She reportedly had a viral syndrome preceding this for a week or 2.  Basic workup including CBC, BMP, serial troponin, and RVP panel were all negative.  Relevant CVD History -None   ROS: Pt denies any chest discomfort, jaw pain, arm pain, palpitations, syncope, presyncope, orthopnea, PND, or LE edema.  Studies Reviewed I have independently reviewed the patient's ECG, recent blood work, and previous medical record.  Physical Exam VS:  LMP 01/08/2024 (Approximate)        Wt Readings from Last 3 Encounters:  01/22/24 207 lb 14.3 oz (94.3 kg)  01/08/24 195 lb 1.7 oz (88.5 kg)  12/08/23 200 lb (90.7 kg)    GEN: No acute distress. NECK: No JVD; No carotid bruits. CARDIAC: ***RRR, no murmurs, rubs, gallops. RESPIRATORY:  Clear to auscultation. EXTREMITIES:  Warm and well-perfused. No edema.  ASSESSMENT AND PLAN Chest discomfort ***        {Are you ordering a CV Procedure (e.g. stress test, cath, DCCV, TEE, etc)?   Press F2        :[id number]}  Dispo: ***  Signed, Caron Poser, MD

## 2024-01-27 ENCOUNTER — Ambulatory Visit

## 2024-01-29 ENCOUNTER — Encounter: Payer: Self-pay | Admitting: Physical Medicine and Rehabilitation

## 2024-01-29 ENCOUNTER — Encounter: Attending: Physical Medicine and Rehabilitation | Admitting: Physical Medicine and Rehabilitation

## 2024-01-29 VITALS — BP 101/69 | HR 95 | Ht 70.0 in

## 2024-01-29 DIAGNOSIS — T07XXXA Unspecified multiple injuries, initial encounter: Secondary | ICD-10-CM | POA: Insufficient documentation

## 2024-01-29 DIAGNOSIS — Z5181 Encounter for therapeutic drug level monitoring: Secondary | ICD-10-CM | POA: Diagnosis not present

## 2024-01-29 DIAGNOSIS — R251 Tremor, unspecified: Secondary | ICD-10-CM | POA: Insufficient documentation

## 2024-01-29 DIAGNOSIS — K5903 Drug induced constipation: Secondary | ICD-10-CM | POA: Insufficient documentation

## 2024-01-29 DIAGNOSIS — F419 Anxiety disorder, unspecified: Secondary | ICD-10-CM | POA: Insufficient documentation

## 2024-01-29 DIAGNOSIS — M792 Neuralgia and neuritis, unspecified: Secondary | ICD-10-CM | POA: Insufficient documentation

## 2024-01-29 DIAGNOSIS — L299 Pruritus, unspecified: Secondary | ICD-10-CM | POA: Insufficient documentation

## 2024-01-29 DIAGNOSIS — S81801D Unspecified open wound, right lower leg, subsequent encounter: Secondary | ICD-10-CM | POA: Insufficient documentation

## 2024-01-29 DIAGNOSIS — N946 Dysmenorrhea, unspecified: Secondary | ICD-10-CM | POA: Insufficient documentation

## 2024-01-29 DIAGNOSIS — Z79899 Other long term (current) drug therapy: Secondary | ICD-10-CM | POA: Insufficient documentation

## 2024-01-29 DIAGNOSIS — G8918 Other acute postprocedural pain: Secondary | ICD-10-CM | POA: Insufficient documentation

## 2024-01-29 DIAGNOSIS — G894 Chronic pain syndrome: Secondary | ICD-10-CM | POA: Diagnosis not present

## 2024-01-29 DIAGNOSIS — R202 Paresthesia of skin: Secondary | ICD-10-CM | POA: Insufficient documentation

## 2024-01-29 DIAGNOSIS — R262 Difficulty in walking, not elsewhere classified: Secondary | ICD-10-CM | POA: Insufficient documentation

## 2024-01-29 MED ORDER — TRAMADOL HCL 50 MG PO TABS: 50.0000 mg | ORAL_TABLET | Freq: Three times a day (TID) | ORAL | 0 refills | Status: DC

## 2024-01-29 MED ORDER — OXYCODONE HCL 15 MG PO TABS: 15.0000 mg | ORAL_TABLET | Freq: Four times a day (QID) | ORAL | 0 refills | Status: DC | PRN

## 2024-01-29 NOTE — Patient Instructions (Signed)
 Foods that may reduce pain: 1) Ginger (especially studied for arthritis)- reduce leukotriene production to decrease inflammation 2) Blueberries- high in phytonutrients that decrease inflammation 3) Salmon- marine omega-3s reduce joint swelling and pain 4) Pumpkin seeds- reduce inflammation 5) dark chocolate- reduces inflammation 6) turmeric- reduces inflammation 7) tart cherries - reduce pain and stiffness 8) extra virgin olive oil - its compound olecanthal helps to block prostaglandins  9) chili peppers- can be eaten or applied topically via capsaicin 10) mint- helpful for headache, muscle aches, joint pain, and itching 11) garlic- reduces inflammation 12) Green tea- reduces inflammation and oxidative stress, helps with weight loss, may reduce the risk of cancer, recommend Double [employer] Tea daily  Link to further information on diet for chronic pain: http://www.bray.com/   Constipation:  -Provided list of following foods that help with constipation and highlighted a few: 1) prunes- contain high amounts of fiber.  2) apples- has a form of dietary fiber called pectin that accelerates stool movement and increases beneficial gut bacteria 3) pears- in addition to fiber, also high in fructose and sorbitol which have laxative effect 4) figs- contain an enzyme ficin which helps to speed colonic transit 5) kiwis- contain an enzyme actinidin that improves gut motility and reduces constipation 6) oranges- rich in pectin (like apples) 7) grapefruits- contain a flavanol naringenin which has a laxative effect 8) vegetables- rich in fiber and also great sources of folate, vitamin C, and K 9) artichoke- high in inulin, prebiotic great for the microbiome 10) chicory- increases stool frequency and softness (can be added to coffee) 11) rhubarb- laxative effect 12) sweet potato- high fiber 13) beans, peas, and  lentils- contain both soluble and insoluble fiber 14) chia seeds- improves intestinal health and gut flora 15) flaxseeds- laxative effect 16) whole grain rye bread- high in fiber 17) oat bran- high in soluble and insoluble fiber 18) kefir- softens stools -recommended to try at least one of these foods every day.  -drink 6-8 glasses of water per day -walk regularly, especially after meals.
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
## 2024-02-03 ENCOUNTER — Other Ambulatory Visit: Payer: Self-pay | Admitting: Physical Medicine and Rehabilitation

## 2024-02-03 LAB — DRUG TOX ALC METAB W/CON, ORAL FLD: Alcohol Metabolite: NEGATIVE ng/mL (ref <25)

## 2024-02-03 LAB — DRUG TOX MONITOR 1 W/CONF, ORAL FLD
Amphetamines: NEGATIVE ng/mL (ref <10)
Barbiturates: NEGATIVE ng/mL (ref <10)
Benzodiazepines: NEGATIVE ng/mL (ref <0.50)
Buprenorphine: NEGATIVE ng/mL (ref <0.10)
Cocaine: NEGATIVE ng/mL (ref <5.0)
Codeine: NEGATIVE ng/mL (ref <2.5)
Dihydrocodeine: NEGATIVE ng/mL (ref <2.5)
Fentanyl: NEGATIVE ng/mL (ref <0.10)
Heroin Metabolite: NEGATIVE ng/mL (ref <1.0)
Hydrocodone: NEGATIVE ng/mL (ref <2.5)
Hydromorphone: NEGATIVE ng/mL (ref <2.5)
MARIJUANA: POSITIVE ng/mL — AB (ref <2.5)
MDMA: NEGATIVE ng/mL (ref <10)
Meprobamate: NEGATIVE ng/mL (ref <2.5)
Methadone: NEGATIVE ng/mL (ref <5.0)
Morphine: NEGATIVE ng/mL (ref <2.5)
Nicotine Metabolite: NEGATIVE ng/mL (ref <5.0)
Norhydrocodone: NEGATIVE ng/mL (ref <2.5)
Noroxycodone: 44.1 ng/mL — ABNORMAL HIGH (ref <2.5)
Opiates: POSITIVE ng/mL — AB (ref <2.5)
Oxycodone: 191.1 ng/mL — ABNORMAL HIGH (ref <2.5)
Oxymorphone: NEGATIVE ng/mL (ref <2.5)
Phencyclidine: NEGATIVE ng/mL (ref <10)
THC: 64.4 ng/mL — ABNORMAL HIGH (ref <2.5)
Tapentadol: NEGATIVE ng/mL (ref <5.0)
Tramadol: >500 ng/mL — ABNORMAL HIGH (ref <5.0)
Tramadol: POSITIVE ng/mL — AB (ref <5.0)
Zolpidem: NEGATIVE ng/mL (ref <5.0)

## 2024-02-03 MED ORDER — OXYCODONE HCL 15 MG PO TABS: 15.0000 mg | ORAL_TABLET | Freq: Four times a day (QID) | ORAL | 0 refills | Status: DC | PRN

## 2024-02-04 ENCOUNTER — Other Ambulatory Visit: Payer: Self-pay | Admitting: Physical Medicine and Rehabilitation

## 2024-02-04 ENCOUNTER — Telehealth: Payer: Self-pay

## 2024-02-04 ENCOUNTER — Ambulatory Visit

## 2024-02-04 MED ORDER — OXYCODONE HCL 15 MG PO TABS: 15.0000 mg | ORAL_TABLET | Freq: Four times a day (QID) | ORAL | 0 refills | Status: DC | PRN

## 2024-02-04 NOTE — Telephone Encounter (Signed)
 Patient Oxycodone  15 mg says it was printed yesterday not sent to pharmacy. Can you resend it to the pharmacy?

## 2024-02-05 ENCOUNTER — Telehealth: Payer: Self-pay

## 2024-02-05 ENCOUNTER — Ambulatory Visit: Admitting: Occupational Therapy

## 2024-02-05 NOTE — Telephone Encounter (Signed)
(  KeyBETHA BROWN) PA Case ID #: [id number] Rx #: [id number] Need Help? Call us  at [phone] Status sent iconSent to Plan today Drug oxyCODONE  HCl 15MG  tablets ePA cloud logo Form [facility] [place]  Medicaid Electronic PA Form [phone] NCPDP)

## 2024-02-05 NOTE — Telephone Encounter (Signed)
 Patient called to let us  know her oxycodone  needs prior auth.  Please call patient she runs out today and she doesn't want to be in pain.

## 2024-02-09 ENCOUNTER — Telehealth: Payer: Self-pay | Admitting: *Deleted

## 2024-02-09 NOTE — Telephone Encounter (Signed)
 Outcome Approved on November 13 by [facility]  Medicaid [place] Case: [id number], Status: Approved, Coverage Starts on: 02/05/2024 12:00:00 AM, Coverage Ends on: 08/03/2024 12:00:00 AM. Effective Date: 02/05/2024 Authorization Expiration Date: 08/03/2024

## 2024-02-09 NOTE — Progress Notes (Deleted)
 NO SHOW

## 2024-02-09 NOTE — Telephone Encounter (Signed)
 Kylie [name] is calling for a refill on her tramadol .

## 2024-02-10 ENCOUNTER — Ambulatory Visit: Attending: Cardiovascular Disease | Admitting: Cardiovascular Disease

## 2024-02-10 ENCOUNTER — Other Ambulatory Visit: Payer: Self-pay | Admitting: Physical Medicine and Rehabilitation

## 2024-02-10 MED ORDER — TRAMADOL HCL 50 MG PO TABS: 50.0000 mg | ORAL_TABLET | Freq: Three times a day (TID) | ORAL | 0 refills | Status: DC

## 2024-02-12 ENCOUNTER — Ambulatory Visit

## 2024-02-12 ENCOUNTER — Ambulatory Visit: Attending: Physical Medicine and Rehabilitation
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
## 2024-02-17 ENCOUNTER — Ambulatory Visit

## 2024-02-23 ENCOUNTER — Encounter: Admitting: Registered Nurse

## 2024-02-24 ENCOUNTER — Ambulatory Visit

## 2024-02-24 ENCOUNTER — Telehealth: Payer: Self-pay | Admitting: Physical Medicine and Rehabilitation

## 2024-02-24 ENCOUNTER — Other Ambulatory Visit: Payer: Self-pay | Admitting: Physical Medicine and Rehabilitation

## 2024-02-24 MED ORDER — APIXABAN 2.5 MG PO TABS: 2.5000 mg | ORAL_TABLET | Freq: Two times a day (BID) | ORAL | 0 refills | Status: DC

## 2024-02-24 MED ORDER — TIZANIDINE HCL 4 MG PO TABS: 4.0000 mg | ORAL_TABLET | Freq: Four times a day (QID) | ORAL | 0 refills | Status: DC

## 2024-02-24 MED ORDER — GABAPENTIN 300 MG PO CAPS: 300.0000 mg | ORAL_CAPSULE | Freq: Three times a day (TID) | ORAL | 0 refills | Status: DC

## 2024-02-24 MED ORDER — METOPROLOL TARTRATE 25 MG PO TABS: 25.0000 mg | ORAL_TABLET | Freq: Two times a day (BID) | ORAL | 0 refills | Status: DC

## 2024-02-24 NOTE — Telephone Encounter (Signed)
 Pt need meds refills gabapentin , tizonidine, Buspirone , metoprolol  tertrate, tramadol , and eliquis . Pt requested a call after the refill

## 2024-02-25 ENCOUNTER — Other Ambulatory Visit: Payer: Self-pay | Admitting: Physical Medicine and Rehabilitation

## 2024-02-25 ENCOUNTER — Telehealth: Payer: Self-pay | Admitting: Physical Medicine and Rehabilitation

## 2024-02-25 MED ORDER — TIZANIDINE HCL 4 MG PO TABS: 4.0000 mg | ORAL_TABLET | Freq: Four times a day (QID) | ORAL | 0 refills | Status: DC

## 2024-02-25 MED ORDER — METOPROLOL TARTRATE 25 MG PO TABS: 25.0000 mg | ORAL_TABLET | Freq: Two times a day (BID) | ORAL | 0 refills | Status: DC

## 2024-02-25 MED ORDER — BUSPIRONE HCL 5 MG PO TABS: 5.0000 mg | ORAL_TABLET | Freq: Two times a day (BID) | ORAL | 0 refills | Status: DC | PRN

## 2024-02-25 MED ORDER — GABAPENTIN 300 MG PO CAPS: 300.0000 mg | ORAL_CAPSULE | Freq: Three times a day (TID) | ORAL | 0 refills | Status: DC

## 2024-02-25 MED ORDER — APIXABAN 2.5 MG PO TABS: 2.5000 mg | ORAL_TABLET | Freq: Two times a day (BID) | ORAL | 0 refills | Status: DC

## 2024-02-25 MED ORDER — TRAMADOL HCL 50 MG PO TABS: 50.0000 mg | ORAL_TABLET | Freq: Three times a day (TID) | ORAL | 0 refills | Status: DC

## 2024-02-25 NOTE — Telephone Encounter (Signed)
 I spoke with patient and she was unable to get a ride here and is out of these medications. She asked if they could be resent electronically to her pharmacy. She was hoping this could be done before her pharmacy closes at 10pm

## 2024-02-26 ENCOUNTER — Other Ambulatory Visit: Payer: Self-pay | Admitting: *Deleted

## 2024-02-26 ENCOUNTER — Telehealth: Payer: Self-pay

## 2024-02-26 ENCOUNTER — Other Ambulatory Visit: Payer: Self-pay | Admitting: Physical Medicine and Rehabilitation

## 2024-02-26 MED ORDER — TIZANIDINE HCL 4 MG PO TABS: 4.0000 mg | ORAL_TABLET | Freq: Four times a day (QID) | ORAL | 0 refills | Status: DC

## 2024-02-26 MED ORDER — TIZANIDINE HCL 4 MG PO TABS: 4.0000 mg | ORAL_TABLET | Freq: Four times a day (QID) | ORAL | 0 refills | Status: AC

## 2024-02-26 MED ORDER — GABAPENTIN 300 MG PO CAPS: 300.0000 mg | ORAL_CAPSULE | Freq: Three times a day (TID) | ORAL | 0 refills | Status: AC

## 2024-02-26 MED ORDER — APIXABAN 2.5 MG PO TABS: 2.5000 mg | ORAL_TABLET | Freq: Two times a day (BID) | ORAL | 0 refills | Status: AC

## 2024-02-26 MED ORDER — BUSPIRONE HCL 5 MG PO TABS: 5.0000 mg | ORAL_TABLET | Freq: Two times a day (BID) | ORAL | 0 refills | Status: AC | PRN

## 2024-02-26 MED ORDER — TRAMADOL HCL 50 MG PO TABS: 50.0000 mg | ORAL_TABLET | Freq: Three times a day (TID) | ORAL | 0 refills | Status: AC

## 2024-02-26 MED ORDER — METOPROLOL TARTRATE 25 MG PO TABS: 25.0000 mg | ORAL_TABLET | Freq: Two times a day (BID) | ORAL | 0 refills | Status: AC

## 2024-02-26 NOTE — Telephone Encounter (Signed)
 Med refills sent to the pharmacy.

## 2024-02-27 ENCOUNTER — Telehealth: Payer: Self-pay | Admitting: Physical Medicine and Rehabilitation

## 2024-02-27 NOTE — Telephone Encounter (Signed)
 Pt said she is running out of oxycodone  on Monday and she need urgent refill

## 2024-03-01 ENCOUNTER — Other Ambulatory Visit: Payer: Self-pay | Admitting: Physical Medicine and Rehabilitation

## 2024-03-01 MED ORDER — OXYCODONE HCL 15 MG PO TABS: 15.0000 mg | ORAL_TABLET | Freq: Four times a day (QID) | ORAL | 0 refills | Status: DC | PRN

## 2024-03-02 ENCOUNTER — Ambulatory Visit

## 2024-03-02 ENCOUNTER — Ambulatory Visit: Admitting: Occupational Therapy

## 2024-03-02 DIAGNOSIS — R2681 Unsteadiness on feet: Secondary | ICD-10-CM | POA: Insufficient documentation

## 2024-03-02 DIAGNOSIS — M6281 Muscle weakness (generalized): Secondary | ICD-10-CM | POA: Insufficient documentation

## 2024-03-02 DIAGNOSIS — T1490XA Injury, unspecified, initial encounter: Secondary | ICD-10-CM | POA: Insufficient documentation

## 2024-03-02 NOTE — Therapy (Deleted)
 OUTPATIENT PHYSICAL THERAPY NEURO EVALUATION   Patient Name: Kylie [name] MRN: [id number] DOB:[birth year], 33 y.o., female Today's Date: 03/02/2024   PCP: Cletus Glenn  REFERRING PROVIDER: Maurice Sharlet RAMAN, PA-C   END OF SESSION:   Past Medical History:  Diagnosis Date   Anemia    Anxiety    Family history of ovarian cancer    3/22 cancer genetic testing letter sent   History of gonorrhea    treated 07/2019 at [place] [facility]   History of urinary infection    Irritable bowel syndrome    Migraine    Past Surgical History:  Procedure Laterality Date   COLONOSCOPY     denies surgical history     EXTERNAL FIXATION, ANKLE  01/07/2024   Procedure: EXTERNAL FIXATION OF RIGHT TALUS WITH PLACEMENT OF WOUND VAC;  Surgeon: Reyne Cordella SQUIBB, MD;  Location: [facility] OR;  Service: Orthopedics;;   FEMUR IM NAIL Right 01/07/2024   Procedure: INSERTION, INTRAMEDULLARY ROD, FEMUR, RETROGRADE;  Surgeon: Reyne Cordella SQUIBB, MD;  Location: [facility];  Service: Orthopedics;  Laterality: Right;   IRRIGATION AND DEBRIDEMENT KNEE Right 01/07/2024   Procedure: IRRIGATION AND DEBRIDEMENT KNEE;  Surgeon: Reyne Cordella SQUIBB, MD;  Location: [facility];  Service: Orthopedics;  Laterality: Right;  irrigation and debridement right calcaneous   NO PAST SURGERIES     ORIF CALCANEOUS FRACTURE Right 01/08/2024   Procedure: OPEN REDUCTION INTERNAL FIXATION (ORIF) CALCANEOUS FRACTURE;  Surgeon: Celena Sharper, MD;  Location: [facility];  Service: Orthopedics;  Laterality: Right;  ORIF RIGHT TALUX FRACTURE, REPEAT DEBRIDEMENT , POSSIBLE REMOVAL EX FIX   TUBAL LIGATION N/A 06/30/2020   Procedure: POST PARTUM TUBAL LIGATION;  Surgeon: Lake Read, MD;  Location: [facility] ORS;  Service: Gynecology;  Laterality: N/A;   Patient Active Problem List   Diagnosis Date Noted   Coping style affecting medical condition 01/20/2024   Difficulty coping with pain 01/20/2024   Trauma 01/15/2024   Splenic laceration 01/07/2024    Gestational diabetes mellitus (GDM) affecting third pregnancy 07/01/2020   Unwanted fertility    [redacted] weeks gestation of pregnancy    Admission for sterilization    Encounter for planned induction of labor 06/28/2020   Pelvic pain in female 06/08/2020   Gestational diabetes mellitus, currently pregnant 05/23/2020   Abnormal glucose affecting pregnancy 04/20/2020   Late prenatal care 29 wks 04/19/2020   Overweight BMI=29 04/19/2020   Supervision of high risk pregnancy in third trimester 04/19/2020   Irritable bowel syndrome dx'd age 11 04/19/2020   history macrosomic infant 9 lb 3 oz 12/21/09 04/19/2020   fetal prominent right rental pelvis on 04/14/20 u/s 04/19/2020   Marijuana use with +UDS 02/28/20 ER; +UDS MJ 04/19/20 04/19/2020   COVID-19 affecting pregnancy in third trimester 04/14/20    Urinary tract infection without hematuria 03/14/21 in ER    Anxiety associated with depression dx'd age 42 07/15/2016    ONSET DATE: ***  REFERRING DIAG: T14.90XA (ICD-10-CM) - Trauma   THERAPY DIAG:  Trauma  Muscle weakness of lower extremity  Unsteadiness on feet  Rationale for Evaluation and Treatment: Rehabilitation  SUBJECTIVE:  SUBJECTIVE STATEMENT: *** Pt accompanied by: {accompnied:27141}  PERTINENT HISTORY: ***  PAIN:  Are you having pain? {OPRCPAIN:27236}  PRECAUTIONS: {Therapy precautions:24002}  RED FLAGS: {PT Red Flags:29287}   WEIGHT BEARING RESTRICTIONS: {Yes ***/No:24003}  FALLS: Has patient fallen in last 6 months? {fallsyesno:27318}  LIVING ENVIRONMENT: Lives with: {OPRC lives with:25569::lives with their family} Lives in: {Lives in:25570} Stairs: {opstairs:27293} Has following equipment at home: {Assistive devices:23999}  PLOF: {PLOF:24004}  PATIENT GOALS:  ***  OBJECTIVE:  Note: Objective measures were completed at Evaluation unless otherwise noted.  DIAGNOSTIC FINDINGS: ***  COGNITION: Overall cognitive status: {cognition:24006}   SENSATION: {sensation:27233}  COORDINATION: ***  EDEMA:  {edema:24020}  MUSCLE TONE: {LE tone:25568}   POSTURE: {posture:25561}  LOWER EXTREMITY ROM:     {AROM/PROM:27142}  Right Eval Left Eval  Hip flexion    Hip extension    Hip abduction    Hip adduction    Hip internal rotation    Hip external rotation    Knee flexion    Knee extension    Ankle dorsiflexion    Ankle plantarflexion    Ankle inversion    Ankle eversion     (Blank rows = not tested)  LOWER EXTREMITY MMT:    MMT Right Eval Left Eval  Hip flexion    Hip extension    Hip abduction    Hip adduction    Hip internal rotation    Hip external rotation    Knee flexion    Knee extension    Ankle dorsiflexion    Ankle plantarflexion    Ankle inversion    Ankle eversion    (Blank rows = not tested)  BED MOBILITY:  {bed mobility:32615:p}  TRANSFERS: {transfers eval:32620}  RAMP:  {ramp eval:32616}  CURB:  {curb eval:32617}  STAIRS: {stairs eval:32618} GAIT: Findings: {GaitneuroPT:32644::Distance walked: ***,Comments: ***}  FUNCTIONAL TESTS:  {Functional tests:24029}  PATIENT SURVEYS:  {rehab surveys:24030}                                                                                                                              TREATMENT DATE: ***    PATIENT EDUCATION: Education details: *** Person educated: {Person educated:25204} Education method: {Education Method:25205} Education comprehension: {Education Comprehension:25206}  HOME EXERCISE PROGRAM: ***  GOALS: Goals reviewed with patient? {yes/no:20286}  SHORT TERM GOALS: Target date: ***  *** Baseline: Goal status: INITIAL  2.  *** Baseline:  Goal status: INITIAL  3.  *** Baseline:  Goal status: INITIAL  4.   *** Baseline:  Goal status: INITIAL  5.  *** Baseline:  Goal status: INITIAL  6.  *** Baseline:  Goal status: INITIAL  LONG TERM GOALS: Target date: ***  ***.  Patient (> 87 years old) will complete five times sit to stand test in < 15 seconds indicating an increased LE strength and improved balance. Baseline: *** Goal status: INITIAL  ***.  Patient will increase Berg Balance score by > 6 points to demonstrate  decreased fall risk during functional activities. Baseline: *** Goal status: INITIAL   ***.  Patient will reduce timed up and go to <11 seconds to reduce fall risk and demonstrate improved transfer/gait ability. Baseline: *** Goal status: INITIAL  ***.  Patient will increase 10 meter walk test to >1.39m/s as to improve gait speed for better community ambulation and to reduce fall risk. Baseline: *** Goal status: INITIAL  ***.  Patient will increase six minute walk test distance to >1000 for progression to community ambulator and improve gait ability Baseline: *** Goal status: INITIAL  ***.  Patient will increase FGA score by > 4 points to demonstrate decreased fall risk during functional activities. Baseline: *** Goal status: INITIAL  ASSESSMENT:  CLINICAL IMPRESSION: Patient is a *** y.o. *** who was seen today for physical therapy evaluation and treatment for ***.   OBJECTIVE IMPAIRMENTS: {opptimpairments:25111}.   ACTIVITY LIMITATIONS: {activitylimitations:27494}  PARTICIPATION LIMITATIONS: {participationrestrictions:25113}  PERSONAL FACTORS: {Personal factors:25162} are also affecting patient's functional outcome.   REHAB POTENTIAL: {rehabpotential:25112}  CLINICAL DECISION MAKING: {clinical decision making:25114}  EVALUATION COMPLEXITY: {Evaluation complexity:25115}  PLAN:  PT FREQUENCY: 1-2x/week  PT DURATION: 12 weeks  PLANNED INTERVENTIONS: {rehab planned interventions:25118::97110-Therapeutic exercises,97530- Therapeutic  [id number]- Neuromuscular re-education,97535- Self Rjmz,02859- Manual therapy,Patient/Family education}  PLAN FOR NEXT SESSION: ***   Norman KATHEE Sharps, PT 03/02/2024, 1:52 PM

## 2024-03-08 NOTE — Telephone Encounter (Signed)
 All rx's were sent to patient's pharmacy on 12/4

## 2024-03-09 ENCOUNTER — Ambulatory Visit

## 2024-03-16 ENCOUNTER — Ambulatory Visit

## 2024-03-23 ENCOUNTER — Ambulatory Visit: Admitting: Occupational Therapy

## 2024-03-23 ENCOUNTER — Ambulatory Visit: Admitting: Physical Therapy

## 2024-03-27 ENCOUNTER — Other Ambulatory Visit: Payer: Self-pay | Admitting: Physical Medicine and Rehabilitation

## 2024-03-29 ENCOUNTER — Other Ambulatory Visit: Payer: Self-pay

## 2024-03-30 ENCOUNTER — Ambulatory Visit

## 2024-03-30 ENCOUNTER — Ambulatory Visit: Admitting: Occupational Therapy

## 2024-04-01 ENCOUNTER — Telehealth: Payer: Self-pay | Admitting: Physical Medicine and Rehabilitation

## 2024-04-01 NOTE — Telephone Encounter (Signed)
 Pt needs a med refill on oxycodone  and gabapentin . She said she is running out of oxycodone  tomorrow.

## 2024-04-02 ENCOUNTER — Encounter: Attending: Registered Nurse | Admitting: Registered Nurse

## 2024-04-02 VITALS — BP 96/58 | HR 78

## 2024-04-02 DIAGNOSIS — F1190 Opioid use, unspecified, uncomplicated: Secondary | ICD-10-CM | POA: Insufficient documentation

## 2024-04-02 DIAGNOSIS — G894 Chronic pain syndrome: Secondary | ICD-10-CM | POA: Insufficient documentation

## 2024-04-02 DIAGNOSIS — T07XXXA Unspecified multiple injuries, initial encounter: Secondary | ICD-10-CM | POA: Diagnosis not present

## 2024-04-02 DIAGNOSIS — M79671 Pain in right foot: Secondary | ICD-10-CM | POA: Insufficient documentation

## 2024-04-02 DIAGNOSIS — Z5181 Encounter for therapeutic drug level monitoring: Secondary | ICD-10-CM | POA: Insufficient documentation

## 2024-04-02 DIAGNOSIS — Z79899 Other long term (current) drug therapy: Secondary | ICD-10-CM | POA: Insufficient documentation

## 2024-04-02 DIAGNOSIS — M25561 Pain in right knee: Secondary | ICD-10-CM | POA: Insufficient documentation

## 2024-04-02 MED ORDER — OXYCODONE HCL 15 MG PO TABS: 15.0000 mg | ORAL_TABLET | Freq: Four times a day (QID) | ORAL | 0 refills | Status: AC | PRN

## 2024-04-02 NOTE — Progress Notes (Signed)
 "  Subjective:    Patient ID: Kylie [name], female    DOB: 07-Aug-1990, 34 y.o.   MRN: [id number]  HPI: Kylie [name] is a 34 y.o. female who returns for follow up appointment for chronic pain and medication refill. She states her pain is located in her right lower extremity, right knee and right foot. She rates her pain 5. Her current exercise regime is walking and performing stretching exercises.  Kylie [name] Morphine  equivalent is 90.00 MME.  Results from 01/29/2024 was reviewed. Ms. Salminen reports she is not smoking marijuana anymore.    Oral Swab Performed today.   Pain Inventory Average Pain 8 Pain Right Now 5 My pain is constant, sharp, stabbing, tingling, and aching  In the last 24 hours, has pain interfered with the following? General activity 5 Relation with others 0 Enjoyment of life 7 What TIME of day is your pain at its worst? morning  and night Sleep (in general) Fair  Pain is worse with: standing and some activites Pain improves with: rest and medication Relief from Meds: 5  Family History  Problem Relation Age of Onset   Hypertension Mother    Diabetes Mother    Depression Father    Diabetes Father    Hypertension Father    Bipolar disorder Father    Lung cancer Paternal Grandmother    Depression Half-Sister    Bipolar disorder Half-Sister    Lung cancer Paternal Aunt    Brain cancer Paternal Aunt    Heart disease Paternal Uncle    Asperger's syndrome Brother    Alzheimer's disease Maternal Grandfather    Ovarian cancer Maternal Aunt    Social History   Socioeconomic History   Marital status: Single    Spouse name: Not on file   Number of children: 2   Years of education: 9   Highest education level: 9th grade  Occupational History   Occupation: Not employed  Tobacco Use   Smoking status: Former    Types: Cigarettes   Smokeless tobacco: Never   Tobacco comments:    quit Dec 2021 and Denies secondhand smoke exposure  Vaping Use   Vaping  status: Never Used  Substance and Sexual Activity   Alcohol use: Yes   Drug use: Yes    Frequency: 14.0 times per week    Types: Marijuana    Comment: last use around 04/05/20   Sexual activity: Not Currently    Partners: Male    Birth control/protection: Abstinence, Surgical    Comment: last sex July 2021  Other Topics Concern   Not on file  Social History Narrative   Not on file   Social Drivers of Health   Tobacco Use: Medium Risk (03/02/2024)   Patient History    Smoking Tobacco Use: Former    Smokeless Tobacco Use: Never    Passive Exposure: Not on Actuary Strain: Not on file  Food Insecurity: Patient Declined (01/07/2024)   Epic    Worried About Programme Researcher, Broadcasting/film/video in the Last Year: Patient declined    Barista in the Last Year: Patient declined  Transportation Needs: Patient Declined (01/07/2024)   Epic    Lack of Transportation (Medical): Patient declined    Lack of Transportation (Non-Medical): Patient declined  Physical Activity: Not on file  Stress: Not on file  Social Connections: Not on file  Depression (PHQ2-9): Low Risk (01/29/2024)   Depression (PHQ2-9)    PHQ-2 Score: 2  Alcohol Screen: Not on file  Housing: Unknown (01/07/2024)   Epic    Unable to Pay for Housing in the Last Year: Patient declined    Number of Times Moved in the Last Year: 0    Homeless in the Last Year: Patient declined  Utilities: Patient Declined (01/07/2024)   Epic    Threatened with loss of utilities: Patient declined  Health Literacy: Not on file   Past Surgical History:  Procedure Laterality Date   COLONOSCOPY     denies surgical history     EXTERNAL FIXATION, ANKLE  01/07/2024   Procedure: EXTERNAL FIXATION OF RIGHT TALUS WITH PLACEMENT OF WOUND VAC;  Surgeon: Reyne Cordella SQUIBB, MD;  [facility];  Service: Orthopedics;;   FEMUR IM NAIL Right 01/07/2024   Procedure: INSERTION, INTRAMEDULLARY ROD, FEMUR, RETROGRADE;  Surgeon: Reyne Cordella SQUIBB,  MD;  [facility]: [facility];  Service: Orthopedics;  Laterality: Right;   IRRIGATION AND DEBRIDEMENT KNEE Right 01/07/2024   Procedure: IRRIGATION AND DEBRIDEMENT KNEE;  Surgeon: Reyne Cordella SQUIBB, MD;  [facility]: [facility];  Service: Orthopedics;  Laterality: Right;  irrigation and debridement right calcaneous   NO PAST SURGERIES     ORIF CALCANEOUS FRACTURE Right 01/08/2024   Procedure: OPEN REDUCTION INTERNAL FIXATION (ORIF) CALCANEOUS FRACTURE;  Surgeon: Celena Sharper, MD;  [facility];  Service: Orthopedics;  Laterality: Right;  ORIF RIGHT TALUX FRACTURE, REPEAT DEBRIDEMENT , POSSIBLE REMOVAL EX FIX   TUBAL LIGATION N/A 06/30/2020   Procedure: POST PARTUM TUBAL LIGATION;  Surgeon: Lake Read, MD;  Location: [facility];  Service: Gynecology;  Laterality: N/A;   Past Surgical History:  Procedure Laterality Date   COLONOSCOPY     denies surgical history     EXTERNAL FIXATION, ANKLE  01/07/2024   Procedure: EXTERNAL FIXATION OF RIGHT TALUS WITH PLACEMENT OF WOUND VAC;  Surgeon: Reyne Cordella SQUIBB, MD;  [facility];  Service: Orthopedics;;   FEMUR IM NAIL Right 01/07/2024   Procedure: INSERTION, INTRAMEDULLARY ROD, FEMUR, RETROGRADE;  Surgeon: Reyne Cordella SQUIBB, MD;  [facility];  Service: Orthopedics;  Laterality: Right;   IRRIGATION AND DEBRIDEMENT KNEE Right 01/07/2024   Procedure: IRRIGATION AND DEBRIDEMENT KNEE;  Surgeon: Reyne Cordella SQUIBB, MD;  [facility];  Service: Orthopedics;  Laterality: Right;  irrigation and debridement right calcaneous   NO PAST SURGERIES     ORIF CALCANEOUS FRACTURE Right 01/08/2024   Procedure: OPEN REDUCTION INTERNAL FIXATION (ORIF) CALCANEOUS FRACTURE;  Surgeon: Celena Sharper, MD;  [facility];  Service: Orthopedics;  Laterality: Right;  ORIF RIGHT TALUX FRACTURE, REPEAT DEBRIDEMENT , POSSIBLE REMOVAL EX FIX   TUBAL LIGATION N/A 06/30/2020   Procedure: POST PARTUM TUBAL LIGATION;  Surgeon: Lake Read, MD;  Location: [facility];  Service:  Gynecology;  Laterality: N/A;   Past Medical History:  Diagnosis Date   Anemia    Anxiety    Family history of ovarian cancer    3/22 cancer genetic testing letter sent   History of gonorrhea    treated 07/2019 at [facility]   History of urinary infection    Irritable bowel syndrome    Migraine    BP 97/66   Pulse 78   SpO2 96%   Opioid Risk Score:   Fall Risk Score:  `1  Depression screen [facility] 2/9     01/29/2024   12:11 PM 11/15/2019   10:38 AM  Depression screen PHQ 2/9  Decreased Interest 0 0  Down, Depressed, Hopeless 0 0  PHQ - 2 Score 0 0  Altered sleeping 0   Tired, decreased energy 1   Change in appetite 1   Feeling bad or failure about yourself  0   Trouble concentrating 0   Moving slowly or fidgety/restless 0   Suicidal thoughts 0   PHQ-9 Score 2       Review of Systems  Musculoskeletal:        Right leg pain  All other systems reviewed and are negative.      Objective:   Physical Exam Vitals and nursing note reviewed.  Constitutional:      Appearance: Normal appearance.  Cardiovascular:     Rate and Rhythm: Normal rate and regular rhythm.     Pulses: Normal pulses.     Heart sounds: Normal heart sounds.  Pulmonary:     Effort: Pulmonary effort is normal.     Breath sounds: Normal breath sounds.  Musculoskeletal:     Comments: Normal Muscle Bulk and Muscle Testing Reveals:  Upper Extremities: Full ROM and Muscle Strength 5/5  Lower Extremities : Right: Decreased ROM and Muscle Strength 4/5 Wearig Right Foot Brace Left Lower Extremity: Full ROM and Muscle Strength 5/5 Arrived in wheelchair     Skin:    General: Skin is warm and dry.  Neurological:     Mental Status: She is alert and oriented to person, place, and time.  Psychiatric:        Mood and Affect: Mood normal.        Behavior: Behavior normal.          Assessment & Plan:  Critical Polytrauma: Ortho following. Continue HEP as instructed by Ortho, she is  currently non-weight bearing.  Chronic Pain Syndrome: refilled: Oxycodone  15 mg one tablet every 6 hours as needed for pain. We will continue the opioid monitoring program, this consists of regular clinic visits, examinations, urine drug screen, pill counts as well as use of [place]  Controlled Substance Reporting system. A 12 month History has been reviewed on the [place]  Controlled Substance Reporting System on 04/02/2024. F/U in 1 month   "

## 2024-04-05 ENCOUNTER — Encounter: Admitting: Registered Nurse

## 2024-04-05 NOTE — Telephone Encounter (Signed)
 Patient was seen on 1/9 by Fidela and received refills

## 2024-04-06 ENCOUNTER — Ambulatory Visit: Admitting: Occupational Therapy

## 2024-04-06 ENCOUNTER — Ambulatory Visit: Admitting: Physical Therapy

## 2024-04-07 ENCOUNTER — Telehealth: Payer: Self-pay | Admitting: Registered Nurse

## 2024-04-07 LAB — DRUG TOX MONITOR 1 W/CONF, ORAL FLD
Amphetamines: NEGATIVE ng/mL
Barbiturates: NEGATIVE ng/mL
Benzodiazepines: NEGATIVE ng/mL
Buprenorphine: NEGATIVE ng/mL
Cocaine: NEGATIVE ng/mL
Codeine: NEGATIVE ng/mL
Cotinine: 9.2 ng/mL — ABNORMAL HIGH
Dihydrocodeine: NEGATIVE ng/mL
Fentanyl: NEGATIVE ng/mL
Heroin Metabolite: NEGATIVE ng/mL
Hydrocodone: NEGATIVE ng/mL
Hydromorphone: NEGATIVE ng/mL
MARIJUANA: POSITIVE ng/mL — AB
MDMA: NEGATIVE ng/mL
Meprobamate: NEGATIVE ng/mL
Methadone: NEGATIVE ng/mL
Morphine: NEGATIVE ng/mL
Nicotine Metabolite: POSITIVE ng/mL — AB
Norhydrocodone: NEGATIVE ng/mL
Noroxycodone: 41.9 ng/mL — ABNORMAL HIGH
Opiates: POSITIVE ng/mL — AB
Oxycodone: >250 ng/mL — ABNORMAL HIGH
Oxymorphone: NEGATIVE ng/mL
Phencyclidine: NEGATIVE ng/mL
THC: 66.1 ng/mL — ABNORMAL HIGH
Tapentadol: NEGATIVE ng/mL
Tramadol: NEGATIVE ng/mL
Zolpidem: NEGATIVE ng/mL

## 2024-04-07 LAB — DRUG TOX ALC METAB W/CON, ORAL FLD: Alcohol Metabolite: NEGATIVE ng/mL
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
## 2024-04-08 ENCOUNTER — Encounter: Admitting: Physical Medicine and Rehabilitation

## 2024-04-08 NOTE — Telephone Encounter (Signed)
 Formal warning letter was sent through [employer].

## 2024-04-13 ENCOUNTER — Ambulatory Visit: Admitting: Physical Therapy

## 2024-04-13 ENCOUNTER — Ambulatory Visit

## 2024-04-20 ENCOUNTER — Ambulatory Visit

## 2024-04-20 ENCOUNTER — Ambulatory Visit: Admitting: Physical Therapy

## 2024-04-27 ENCOUNTER — Ambulatory Visit: Admitting: Occupational Therapy

## 2024-04-27 ENCOUNTER — Ambulatory Visit: Admitting: Physical Therapy

## 2024-04-30 ENCOUNTER — Encounter: Attending: Registered Nurse | Admitting: Registered Nurse

## 2024-04-30 NOTE — Progress Notes (Unsigned)
 "  Subjective:    Patient ID: Kylie [name], female    DOB: [birth year], 34 y.o.   MRN: [id number]  HPI  Pain Inventory Average Pain {NUMBERS; 0-10:5044} Pain Right Now {NUMBERS; 0-10:5044} My pain is {PAIN DESCRIPTION:21022940}  In the last 24 hours, has pain interfered with the following? General activity {NUMBERS; 0-10:5044} Relation with others {NUMBERS; 0-10:5044} Enjoyment of life {NUMBERS; 0-10:5044} What TIME of day is your pain at its worst? {time of day:24191} Sleep (in general) {BHH GOOD/FAIR/POOR:22877}  Pain is worse with: {ACTIVITIES:21022942} Pain improves with: {PAIN IMPROVES TPUY:78977056} Relief from Meds: {NUMBERS; 0-10:5044}  Family History  Problem Relation Age of Onset   Hypertension Mother    Diabetes Mother    Depression Father    Diabetes Father    Hypertension Father    Bipolar disorder Father    Lung cancer Paternal Grandmother    Depression Half-Sister    Bipolar disorder Half-Sister    Lung cancer Paternal Aunt    Brain cancer Paternal Aunt    Heart disease Paternal Uncle    Asperger's syndrome Brother    Alzheimer's disease Maternal Grandfather    Ovarian cancer Maternal Aunt    Social History   Socioeconomic History   Marital status: Single    Spouse name: Not on file   Number of children: 2   Years of education: 9   Highest education level: 9th grade  Occupational History   Occupation: Not employed  Tobacco Use   Smoking status: Former    Types: Cigarettes   Smokeless tobacco: Never   Tobacco comments:    quit Dec 2021 and Denies secondhand smoke exposure  Vaping Use   Vaping status: Never Used  Substance and Sexual Activity   Alcohol use: Yes   Drug use: Yes    Frequency: 14.0 times per week    Types: Marijuana    Comment: last use around 04/05/20   Sexual activity: Not Currently    Partners: Male    Birth control/protection: Abstinence, Surgical    Comment: last sex July 2021  Other Topics Concern   Not on file   Social History Narrative   Not on file   Social Drivers of Health   Tobacco Use: Medium Risk (03/02/2024)   Patient History    Smoking Tobacco Use: Former    Smokeless Tobacco Use: Never    Passive Exposure: Not on Actuary Strain: Not on file  Food Insecurity: Patient Declined (01/07/2024)   Epic    Worried About Programme Researcher, Broadcasting/film/video in the Last Year: Patient declined    Barista in the Last Year: Patient declined  Transportation Needs: Patient Declined (01/07/2024)   Epic    Lack of Transportation (Medical): Patient declined    Lack of Transportation (Non-Medical): Patient declined  Physical Activity: Not on file  Stress: Not on file  Social Connections: Not on file  Depression (PHQ2-9): Low Risk (01/29/2024)   Depression (PHQ2-9)    PHQ-2 Score: 2  Alcohol Screen: Not on file  Housing: Unknown (01/07/2024)   Epic    Unable to Pay for Housing in the Last Year: Patient declined    Number of Times Moved in the Last Year: 0    Homeless in the Last Year: Patient declined  Utilities: Patient Declined (01/07/2024)   Epic    Threatened with loss of utilities: Patient declined  Health Literacy: Not on file   Past Surgical History:  Procedure Laterality Date  COLONOSCOPY     denies surgical history     EXTERNAL FIXATION, ANKLE  01/07/2024   Procedure: EXTERNAL FIXATION OF RIGHT TALUS WITH PLACEMENT OF WOUND VAC;  Surgeon: Reyne Cordella SQUIBB, MD;  [facility];  Service: Orthopedics;;   FEMUR IM NAIL Right 01/07/2024   Procedure: INSERTION, INTRAMEDULLARY ROD, FEMUR, RETROGRADE;  Surgeon: Reyne Cordella SQUIBB, MD;  [facility];  Service: Orthopedics;  Laterality: Right;   IRRIGATION AND DEBRIDEMENT KNEE Right 01/07/2024   Procedure: IRRIGATION AND DEBRIDEMENT KNEE;  Surgeon: Reyne Cordella SQUIBB, MD;  [facility];  Service: Orthopedics;  Laterality: Right;  irrigation and debridement right calcaneous   NO PAST SURGERIES     ORIF CALCANEOUS FRACTURE  Right 01/08/2024   Procedure: OPEN REDUCTION INTERNAL FIXATION (ORIF) CALCANEOUS FRACTURE;  Surgeon: Celena Sharper, MD;  [facility];  Service: Orthopedics;  Laterality: Right;  ORIF RIGHT TALUX FRACTURE, REPEAT DEBRIDEMENT , POSSIBLE REMOVAL EX FIX   TUBAL LIGATION N/A 06/30/2020   Procedure: POST PARTUM TUBAL LIGATION;  Surgeon: Lake Read, MD;  Location: [facility];  Service: Gynecology;  Laterality: N/A;   Past Surgical History:  Procedure Laterality Date   COLONOSCOPY     denies surgical history     EXTERNAL FIXATION, ANKLE  01/07/2024   Procedure: EXTERNAL FIXATION OF RIGHT TALUS WITH PLACEMENT OF WOUND VAC;  Surgeon: Reyne Cordella SQUIBB, MD;  [facility];  Service: Orthopedics;;   FEMUR IM NAIL Right 01/07/2024   Procedure: INSERTION, INTRAMEDULLARY ROD, FEMUR, RETROGRADE;  Surgeon: Reyne Cordella SQUIBB, MD;  [facility];  Service: Orthopedics;  Laterality: Right;   IRRIGATION AND DEBRIDEMENT KNEE Right 01/07/2024   Procedure: IRRIGATION AND DEBRIDEMENT KNEE;  Surgeon: Reyne Cordella SQUIBB, MD;  [facility];  Service: Orthopedics;  Laterality: Right;  irrigation and debridement right calcaneous   NO PAST SURGERIES     ORIF CALCANEOUS FRACTURE Right 01/08/2024   Procedure: OPEN REDUCTION INTERNAL FIXATION (ORIF) CALCANEOUS FRACTURE;  Surgeon: Celena Sharper, MD;  [facility];  Service: Orthopedics;  Laterality: Right;  ORIF RIGHT TALUX FRACTURE, REPEAT DEBRIDEMENT , POSSIBLE REMOVAL EX FIX   TUBAL LIGATION N/A 06/30/2020   Procedure: POST PARTUM TUBAL LIGATION;  Surgeon: Lake Read, MD;  Location: [facility];  Service: Gynecology;  Laterality: N/A;   Past Medical History:  Diagnosis Date   Anemia    Anxiety    Family history of ovarian cancer    3/22 cancer genetic testing letter sent   History of gonorrhea    treated 07/2019 at [facility]   History of urinary infection    Irritable bowel syndrome    Migraine    There were no vitals taken for  this visit.  Opioid Risk Score:   Fall Risk Score:  `1  Depression screen [facility] 2/9     01/29/2024   12:11 PM 11/15/2019   10:38 AM  Depression screen PHQ 2/9  Decreased Interest 0 0  Down, Depressed, Hopeless 0 0  PHQ - 2 Score 0 0  Altered sleeping 0   Tired, decreased energy 1   Change in appetite 1   Feeling bad or failure about yourself  0   Trouble concentrating 0   Moving slowly or fidgety/restless 0   Suicidal thoughts 0   PHQ-9 Score 2      Review of Systems     Objective:   Physical Exam        Assessment & Plan:    "

## 2024-05-04 ENCOUNTER — Ambulatory Visit: Admitting: Occupational Therapy

## 2024-05-04 ENCOUNTER — Ambulatory Visit: Admitting: Physical Therapy

## 2024-05-31 ENCOUNTER — Encounter: Admitting: Physical Medicine and Rehabilitation
